# Patient Record
Sex: Female | Born: 1940 | Race: White | Hispanic: No | Marital: Married | Smoking: Never smoker
Health system: Southern US, Community
[De-identification: ages and names within clinical notes are randomized; demographics above are authoritative.]

## PROBLEM LIST (undated history)

## (undated) DIAGNOSIS — E119 Type 2 diabetes mellitus without complications: Secondary | ICD-10-CM

## (undated) DIAGNOSIS — E785 Hyperlipidemia, unspecified: Secondary | ICD-10-CM

## (undated) DIAGNOSIS — M1990 Unspecified osteoarthritis, unspecified site: Secondary | ICD-10-CM

## (undated) DIAGNOSIS — D649 Anemia, unspecified: Secondary | ICD-10-CM

## (undated) DIAGNOSIS — Z9641 Presence of insulin pump (external) (internal): Secondary | ICD-10-CM

## (undated) DIAGNOSIS — Z9889 Other specified postprocedural states: Secondary | ICD-10-CM

## (undated) DIAGNOSIS — R011 Cardiac murmur, unspecified: Secondary | ICD-10-CM

## (undated) DIAGNOSIS — E039 Hypothyroidism, unspecified: Secondary | ICD-10-CM

## (undated) DIAGNOSIS — R112 Nausea with vomiting, unspecified: Secondary | ICD-10-CM

## (undated) DIAGNOSIS — N189 Chronic kidney disease, unspecified: Secondary | ICD-10-CM

## (undated) DIAGNOSIS — K219 Gastro-esophageal reflux disease without esophagitis: Secondary | ICD-10-CM

## (undated) DIAGNOSIS — J45909 Unspecified asthma, uncomplicated: Secondary | ICD-10-CM

## (undated) HISTORY — PX: MENISECTOMY: SHX5181

## (undated) HISTORY — PX: OTHER SURGICAL HISTORY: SHX169

---

## 1970-11-17 HISTORY — PX: ABDOMINAL HYSTERECTOMY: SHX81

## 1993-11-17 HISTORY — PX: CHOLECYSTECTOMY: SHX55

## 1995-11-18 HISTORY — PX: SEPTOPLASTY: SUR1290

## 2001-11-17 HISTORY — PX: BLADDER SURGERY: SHX569

## 2005-01-17 ENCOUNTER — Ambulatory Visit: Payer: Self-pay | Admitting: Internal Medicine

## 2006-03-03 ENCOUNTER — Ambulatory Visit: Payer: Self-pay | Admitting: Internal Medicine

## 2006-11-04 ENCOUNTER — Ambulatory Visit: Payer: Self-pay | Admitting: Specialist

## 2006-11-06 ENCOUNTER — Ambulatory Visit: Payer: Self-pay | Admitting: Specialist

## 2007-03-05 ENCOUNTER — Ambulatory Visit: Payer: Self-pay | Admitting: Internal Medicine

## 2007-03-09 ENCOUNTER — Ambulatory Visit: Payer: Self-pay | Admitting: Internal Medicine

## 2007-04-14 ENCOUNTER — Ambulatory Visit: Payer: Self-pay | Admitting: Specialist

## 2008-05-23 ENCOUNTER — Ambulatory Visit: Payer: Self-pay | Admitting: Internal Medicine

## 2008-11-17 HISTORY — PX: NECK SURGERY: SHX720

## 2008-12-05 ENCOUNTER — Ambulatory Visit: Payer: Self-pay | Admitting: Internal Medicine

## 2009-01-29 ENCOUNTER — Inpatient Hospital Stay (HOSPITAL_COMMUNITY): Admission: RE | Admit: 2009-01-29 | Discharge: 2009-01-30 | Payer: Self-pay | Admitting: Neurosurgery

## 2009-04-27 ENCOUNTER — Ambulatory Visit: Payer: Self-pay | Admitting: Gastroenterology

## 2009-05-31 ENCOUNTER — Ambulatory Visit: Payer: Self-pay | Admitting: Internal Medicine

## 2009-06-19 ENCOUNTER — Ambulatory Visit: Payer: Self-pay | Admitting: Internal Medicine

## 2009-06-27 ENCOUNTER — Ambulatory Visit: Payer: Self-pay | Admitting: Internal Medicine

## 2010-02-04 ENCOUNTER — Ambulatory Visit: Payer: Self-pay

## 2010-03-04 ENCOUNTER — Ambulatory Visit: Payer: Self-pay | Admitting: Orthopedic Surgery

## 2010-03-07 ENCOUNTER — Ambulatory Visit: Payer: Self-pay | Admitting: Orthopedic Surgery

## 2010-08-07 ENCOUNTER — Ambulatory Visit: Payer: Self-pay | Admitting: Internal Medicine

## 2011-01-08 ENCOUNTER — Ambulatory Visit: Payer: Self-pay | Admitting: Urology

## 2011-02-27 LAB — CBC
HCT: 38.8 % (ref 36.0–46.0)
MCHC: 34.9 g/dL (ref 30.0–36.0)
MCV: 85.6 fL (ref 78.0–100.0)
Platelets: 253 10*3/uL (ref 150–400)
RDW: 14.1 % (ref 11.5–15.5)

## 2011-02-27 LAB — BASIC METABOLIC PANEL
BUN: 12 mg/dL (ref 6–23)
CO2: 27 mEq/L (ref 19–32)
Chloride: 106 mEq/L (ref 96–112)
Creatinine, Ser: 1.06 mg/dL (ref 0.4–1.2)
Glucose, Bld: 88 mg/dL (ref 70–99)
Potassium: 4.1 mEq/L (ref 3.5–5.1)

## 2011-04-01 NOTE — Op Note (Signed)
NAMEMANESSA, Elizabeth [name]             ACCOUNT NO.:  [id number]   MEDICAL RECORD NO.:  [id number]          PATIENT TYPE:  INP   LOCATION:  3523                         FACILITY:  [facility]   PHYSICIAN:  Cristi Loron, M.D.DATE OF BIRTH:  [birth year]   DATE OF PROCEDURE:  01/29/2009  DATE OF DISCHARGE:                               OPERATIVE REPORT   BRIEF HISTORY:  The patient in [name] 70 year old white female who suffered  from neck and arm pain.  She has failed medical management, was worked  up with [name] cervical MRI, which demonstrated she had multilevel  degenerative changes, spondylosis, but most prominent at C5-6 and C6-7.  I discussed the various treatment with the patient including surgery.  The patient has weighed the risks, benefits, and alternatives to surgery  and decided to proceed with C5-6 and C6-7 anterior cervical diskectomy,  fusion, and plating.   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   PROCEDURE:  C5-6 and C6-7 extensive anterior cervical  diskectomy/decompression; C5-6 and C6-7 anterior interbody arthrodesis  with local morselized autograft bone and Actifuse bone graft extender;  insertion of C5-6 and C6-7 anterior interbody prosthesis ([place] PEEK  interbody prosthesis); C5-7 anterior cervical plating with Codman slim  lock titanium plate and screws.   SURGEON:  Cristi Loron, MD   ASSISTANT:  Danae Orleans. Venetia Maxon, MD   ANESTHESIA:  General endotracheal.   ESTIMATED BLOOD LOSS:  75 mL.   SPECIMENS:  None.   DRAINS:  None.   COMPLICATIONS:  None.   DESCRIPTION OF PROCEDURE:  The patient was brought to operating room by  anesthesia team.  General endotracheal anesthesia was induced.  The  patient remained in [name] supine position.  [name] roll was placed in her  shoulders, placed her neck in slight extension.  The anterior cervical  region was then prepared with Betadine scrub and Betadine solution.  Sterile drapes were applied.  I then injected the area to be excised  with Marcaine with epinephrine solution.  [name] scalpel to make [name] transverse  incision in the patient's left anterior neck.  I used the Metzenbaum  scissors to divide the platysma muscle and then to dissect medial to  sternocleidomastoid muscle, jugular vein, and carotid artery.  I  carefully dissected down towards the anterior cervical spine identifying  the esophagus and retracting it medially.  I then used the Kittner swabs  to clear soft tissue from the anterior cervical spine and then we  inserted [name] bent spinal needle into the upper exposed intervertebral disk  space.  We obtained intraoperative radiograph to confirm our location.   We then used electrocautery to detach the medial border of the longus  colli muscle bilaterally from the C5-6 and C6-7 intervertebral disk  space.  We began the decompression at C6-7.  We attempted to incised the  C6-7 intervertebral disk with [name] 15 blade scalpel.  It was quite  spondylotic, we had to drill away the anterior spondylosis to get to  the  disk space.  We then incised the intervertebral disk and performed [name]  partial intervertebral diskectomy with pituitary forceps and Carlen  curettes.  We then inserted distraction screws at C6-7, distracted  interspace and then used [name] high-speed drill to decorticate the vertebral  endplates at C6 and C7, drilled away the remainder of C6-7  intervertebral disk to thin out the posterior longitudinal ligament,  drill away posterior spondylosis.  We then incised the ligament with  arachnoid knife and then removed it with Kerrison punch undercutting the  vertebral endplates decompressing the thecal sac and then we performed  foraminotomies about the bilateral C7 nerve root completing the  decompression at this  level.   We then repeated this procedure in analogous fashion at C5-6  decompressing the C5-6 thecal sac and the bilateral C6 nerve roots.  This completed the decompression.   We now turned our attention to arthrodesis.  We used the trial spacers  and determined to use [name] 6-mm medium interbody prosthesis at both levels.  We prefilled the prosthesis with [name] combination of local autograft bone  we obtained during the decompression as well as Actifuse bone graft  extender.  We inserted prosthesis into distracted interspaces and then  removed distraction screws.  There was good snug fit of the prosthesis  at the level.  This completed the fusion and placement of the  prosthesis.   We now turned our attention to anterior spinal instrumentation.  We used  [name] high-speed drill to remove some ventral spondylosis from the vertebral  endplates at C5-6 and C6-7 so that the plate would lay down flat.  We  selected appropriate length Codman slim lock anterior cervical plate and  laid along the anterior aspect of the vertebral bodies from C5-C7.  We  then drilled two 12-mm holes at C5, C6, and C7 and then secured the  plate to the vertebral bodies by placing two 12-mm self-tapping screws  at C5, C6, and C7.  We then obtained intraoperative radiograph which  demonstrated good position of the plates and screws interbody prosthesis  with limited visualization, the construct looked good in vivo.  We  therefore secured these screws and plate by locking each cam.  This  completed the instrumentation.   We then obtained hemostasis using bipolar electrocautery, irrigated the  wound out with bacitracin solution, and then removed the retractor, and  inspected the esophagus for any damage, none apparent.  We then  reapproximated the patient's platysma muscle with interrupted 3-0 Vicryl  suture, subcutaneous tissue with interrupted 3-0 Vicryl suture, and skin  with Steri-Strips and Benzoin.  The wound was then  coated with  bacitracin ointment.  [name] sterile dressing applied, the drapes were  removed, and the patient was subsequently extubated by anesthesia team  and transported to the post anesthesia care unit in stable condition.  All sponge, instrument, and needle counts were correct at the end of the  case.      Cristi Loron, M.D.  Electronically Signed     JDJ/MEDQ  D:  01/29/2009  T:  01/30/2009  Job:  [id number]

## 2011-07-10 ENCOUNTER — Ambulatory Visit: Payer: Self-pay | Admitting: Otolaryngology

## 2011-09-04 ENCOUNTER — Ambulatory Visit: Payer: Self-pay | Admitting: Internal Medicine

## 2012-01-14 ENCOUNTER — Ambulatory Visit: Payer: Self-pay | Admitting: Internal Medicine

## 2012-09-06 ENCOUNTER — Ambulatory Visit: Payer: Self-pay | Admitting: Internal Medicine

## 2013-03-24 LAB — CBC WITH DIFFERENTIAL/PLATELET
Basophil #: 0.1 10*3/uL (ref 0.0–0.1)
HCT: 39.1 % (ref 35.0–47.0)
HGB: 13.3 g/dL (ref 12.0–16.0)
MCH: 30.3 pg (ref 26.0–34.0)
MCHC: 33.9 g/dL (ref 32.0–36.0)
MCV: 89 fL (ref 80–100)
Monocyte #: 0.7 x10 3/mm (ref 0.2–0.9)
Platelet: 218 10*3/uL (ref 150–440)
RDW: 13.2 % (ref 11.5–14.5)
WBC: 6.5 10*3/uL (ref 3.6–11.0)

## 2013-03-24 LAB — COMPREHENSIVE METABOLIC PANEL
Alkaline Phosphatase: 69 U/L (ref 50–136)
Calcium, Total: 9.6 mg/dL (ref 8.5–10.1)
Co2: 30 mmol/L (ref 21–32)
EGFR (African American): >60
EGFR (Non-African Amer.): 58 — ABNORMAL LOW
SGPT (ALT): 23 U/L (ref 12–78)
Sodium: 135 mmol/L — ABNORMAL LOW (ref 136–145)

## 2013-03-24 LAB — URINALYSIS, COMPLETE
Bacteria: NONE SEEN
Ketone: NEGATIVE
Nitrite: NEGATIVE
RBC,UR: <1 /HPF (ref 0–5)
Specific Gravity: 1.008 (ref 1.003–1.030)
WBC UR: 1 /HPF (ref 0–5)

## 2013-03-24 LAB — CK TOTAL AND CKMB (NOT AT ARMC): CK-MB: 3.4 ng/mL (ref 0.5–3.6)

## 2013-03-25 ENCOUNTER — Observation Stay: Payer: Self-pay | Admitting: Internal Medicine

## 2013-09-07 ENCOUNTER — Ambulatory Visit: Payer: Self-pay | Admitting: Internal Medicine

## 2013-09-23 ENCOUNTER — Ambulatory Visit: Payer: Self-pay | Admitting: Orthopedic Surgery

## 2013-11-17 HISTORY — PX: BREAST BIOPSY: SHX20

## 2014-07-09 DIAGNOSIS — Z4681 Encounter for fitting and adjustment of insulin pump: Secondary | ICD-10-CM | POA: Insufficient documentation

## 2014-07-09 DIAGNOSIS — E11649 Type 2 diabetes mellitus with hypoglycemia without coma: Secondary | ICD-10-CM | POA: Insufficient documentation

## 2014-07-09 DIAGNOSIS — E139 Other specified diabetes mellitus without complications: Secondary | ICD-10-CM | POA: Insufficient documentation

## 2014-07-11 DIAGNOSIS — E538 Deficiency of other specified B group vitamins: Secondary | ICD-10-CM | POA: Insufficient documentation

## 2014-10-25 ENCOUNTER — Ambulatory Visit: Payer: Self-pay | Admitting: Internal Medicine

## 2014-11-20 ENCOUNTER — Ambulatory Visit: Payer: Commercial Managed Care - HMO | Admitting: Podiatry

## 2014-12-04 ENCOUNTER — Ambulatory Visit (INDEPENDENT_AMBULATORY_CARE_PROVIDER_SITE_OTHER): Payer: Commercial Managed Care - HMO | Admitting: Podiatry

## 2014-12-04 ENCOUNTER — Encounter: Payer: Self-pay | Admitting: Podiatry

## 2014-12-04 VITALS — BP 120/64 | HR 76 | Resp 16 | Ht 63.0 in | Wt 150.0 lb

## 2014-12-04 DIAGNOSIS — B079 Viral wart, unspecified: Secondary | ICD-10-CM

## 2014-12-04 DIAGNOSIS — B078 Other viral warts: Secondary | ICD-10-CM

## 2014-12-04 NOTE — Progress Notes (Signed)
She presents today with chief complaint of painful lesions 2 plantar aspect of the forefoot left.  Objective: I'll signs are stable she is alert and oriented 3. Pulses are palpable left. 2 small verrucoid lesions plantar aspect of the forefoot left.  Assessment: Verruca plantaris left 2.  Plan: Chemical destruction under occlusion today will follow up with her in 6 weeks.

## 2015-01-15 ENCOUNTER — Ambulatory Visit: Payer: Commercial Managed Care - HMO | Admitting: Podiatry

## 2015-03-09 NOTE — H&P (Signed)
PATIENT NAME:  Elizabeth [name], Elizabeth [name] MR#:  [id number] DATE OF BIRTH:  18-Aug-1941  DATE OF ADMISSION:  03/25/2013  REFERRING PHYSICIAN:  Dr. Cheri Guppy.  PRIMARY CARE PHYSICIAN:  Dr. Lisette Grinder.   CHIEF COMPLAINT:  Left upper and lower extremity weakness.   HISTORY OF PRESENT ILLNESS:  This is a 74 year old female with significant past medical history of diabetes mellitus, hyperlipidemia, hypothyroidism, arthritis, presents with complaints of left side weakness brief episodes, the patient reports she had two episodes of left side weakness upper and lower extremities, reports first episode happened around 5:00 p.m. yesterday when she was blow drying her hair, where she felt a brief episode of left side weakness in the upper and lower extremity, lasted for around 30 seconds which required her to sit down, the patient did not have any facial droop, or dysarthria, or slurred speech, she had episode of dizziness accompanying that episode and reports totally episode resolved after that without any deficits, she did not report this to any family member, the patient did have another one at night around 9:30 p.m. where it lasted as well 30 seconds with similar presentations, as well there was no loss of consciousness, no confusion, no altered status, no facial droop, no slurred speech, and resolved on its own, where she went downstairs spoke to her husband who brought her to ED, in ED patient did not have any focal deficits, has normal radiological exams, had CT brain without contrast which did not show any acute finding, the patient was given 324 mg of by mouth aspirin, without any problem of swallowing or any dysphagia, hospitalist service were requested to admit the patient for further work-up for her TIA symptoms, the patient denies any fever, chills, cough or productive sputum, dysuria, polyuria, coffee-ground emesis.   PAST MEDICAL HISTORY: 1.  Hyperlipidemia.  2.  Hypothyroidism.  3.  Arthritis.  4.  Asthma.   5.  Chronic sinusitis.  6.  Bronchitis.  7.  Type 1 diabetes.   PAST SURGICAL HISTORY: 1.  Hysterectomy.  2.  Cholecystectomy.  3.  Septoplasty.  4.  Cervical laminectomy 2010.   FAMILY HISTORY:  Significant for father having colon cancer.  Mother had cirrhosis of the liver.  As well there is a history of diabetes in the extended family.   SOCIAL HISTORY:  The patient does not smoke, does not drink alcohol.  No history of drug use.  Lives with her husband.   ALLERGIES:  1.  AMOXICILLIN.  2.  CECLOR.  3.  CLINDAMYCIN.  4.  NEOSPORIN.  5.  NIASPAN.  6.  PENICILLIN.  7.  RELAFEN.  8.  STATIN, CAUSING ACLASIA.  9.  SULFASALAZINE.   HOME MEDICATIONS: 1.  Etodolac 400 mg oral 2 times a day.  2.  Insulin pump.  3.  Crestor 5 mg at bedtime.  4.  Nasacort 1 puff each nostril at bedtime.   6.  Omeprazole 40 mg oral daily.  7.  Levothyroxine 0.1 mg oral daily.  9.  Estropipate 1.25 mg 3 to 4 times a week.   REVIEW OF SYSTEMS: CONSTITUTIONAL:  The patient denies any fever or chills, weight loss, weight gain, fatigue.  EYES:  Denies blurry vision, double vision, pain, inflammation, glaucoma.  EARS, NOSE, THROAT:  Denies tinnitus, ear pain, hearing loss, epistaxis or discharge.  RESPIRATORY:  Denies cough, wheezing, hemoptysis, dyspnea, painful respiratory or COPD.  CARDIOVASCULAR:  Denies chest pain, orthopnea, edema, arrhythmia, palpitations or syncope.  GASTROINTESTINAL:  Denies nausea, vomiting, diarrhea,  abdominal pain, hematemesis, melena, rectal bleed, constipation or jaundice.  GENITOURINARY:  Denies dysuria, hematuria, renal colic.  ENDOCRINE:  Denies polyuria, polydipsia, heat or cold intolerance.  HEMATOLOGY:  Denies anemia, easy bruising, bleeding diathesis.  INTEGUMENTARY:  Denies acne, rash or skin lesions.  MUSCULOSKELETAL:  Has history of arthritis.  Denies any gout or cramps or limited activity.  NEUROLOGIC:  Had episode of left upper and lower extremity weakness,  totally resolved, denies any history of CVA, denies any history of seizures or memory loss or headache or ataxia, had mild dizziness with that episode of weakness.  PSYCHIATRIC:  Denies anxiety, bipolar disorder, depression, schizophrenia.   PHYSICAL EXAMINATION: VITAL SIGNS:  Temperature 97.7, pulse 69, respiratory rate 20, blood pressure 160/79, saturating 96% on room air.  GENERAL:  Well-nourished female who looks comfortable in bed in no apparent distress.  HEENT:  Head atraumatic, normocephalic.  Pupils equal, reactive to light.  Pink conjunctivae.  Anicteric sclerae.  Moist oral mucosa.  NECK:  Supple.  No thyromegaly.  No JVD.  No carotid bruits.  CHEST:  Good air entry bilaterally.  No wheezing, rales, rhonchi.  CARDIOVASCULAR:  S1, S2 heard.  No rubs, gallops.  PMI is not displaced.  VASCULAR:  Carotid and femoral pulses +2.  No bruits.  Dorsalis pedis and tibialis +2 as well.  ABDOMEN:  Soft, nontender, nondistended.  Bowel sounds present.  EXTREMITIES:  Mild bilateral pitting edema around the ankle area.  No clubbing.  No cyanosis.  SKIN:  Normal skin turgor.  Warm and dry.  PSYCHIATRIC:  Appropriate affect.  Awake, alert x 3.  Intact judgment and insight.  NEUROLOGIC:  Cranial nerves grossly intact.  Tongue is midline.  Motor, all extremities 5 out of 5 motor strength, sensation is symmetrical and intact.   PERTINENT LABORATORY DATA:  Glucose 115, BUN 18, creatinine 0.97, sodium 135, potassium 4.2, chloride 100, troponin less than 0.02.  White blood cells 6.5, hemoglobin 13.3, hematocrit 39.1, platelets 218.  Urinalysis negative for nitrite, mild leukocyte esterase, but 1 white blood cell and no bacteria seen.  CT head without contrast showing no acute intracranial process.   ASSESSMENT AND PLAN: 1.  Transient ischemic attack, the patient presented with left upper and lower extremity episodes of weakness, very brief, lasted for 30 seconds each, the patient is known to have history of  hyperlipidemia, diabetes mellitus, as well she appears to be having high blood pressure, the patient will be admitted for further work-up, the patient is given 324 mg of aspirin in ED, will be started on baby aspirin daily pending the rest of her work-up, we will obtain MRI of the brain, we will obtain 2-D echo, we will continue her on telemonitor, we will check bilateral carotid Dopplers as well, we will check her lipid panel, she is already on statin which we will continue, blood pressure is mildly elevated, we will allow for blood pressure 160 to 180 to allow better brain perfusion.  2.  Diabetes mellitus, we will check a glycohemoglobin.  The patient is having insulin drip, do anticipate short stay, so we will continue her on insulin drip.  We will continue with her fingersticks before meals and after meals and at bedtime.  3.  Hypothyroidism.  We will continue with Synthroid.  4.  Hyperlipidemia.  We will check lipid panel.  We will continue her on statin.  5.  Hypertension.  The patient does not carry a diagnosis of hypertension in the past, but her blood pressure  seems to be uncontrolled, we will continue to monitor, will not start her on any medication at this point, we will monitor closely, we will allow for blood pressure between 160 to 180 secondary to her diagnosis of transient ischemic attack to allow better perfusion.  6.  Deep vein thrombosis prophylaxis.  SubQ heparin.  7.  Gastrointestinal prophylaxis.  On PPI.  8.  CODE STATUS:  FULL CODE.    Time spent on admission and patient care 55 minutes.    ____________________________ Albertine Patricia, MD dse:ea D: 03/25/2013 01:20:27 ET T: 03/25/2013 02:55:22 ET JOB#: [id number]  cc: Albertine Patricia, MD, <Dictator> Ellora Varnum Graciela Husbands MD ELECTRONICALLY SIGNED 03/30/2013 1:44

## 2015-08-08 ENCOUNTER — Other Ambulatory Visit: Payer: Self-pay | Admitting: Physician Assistant

## 2015-08-08 DIAGNOSIS — M2392 Unspecified internal derangement of left knee: Secondary | ICD-10-CM

## 2015-08-16 ENCOUNTER — Ambulatory Visit
Admission: RE | Admit: 2015-08-16 | Discharge: 2015-08-16 | Disposition: A | Discharge status: Home / Self Care | Payer: Commercial Managed Care - HMO | Source: Ambulatory Visit | Attending: Physician Assistant | Admitting: Physician Assistant

## 2015-08-16 DIAGNOSIS — M7050 Other bursitis of knee, unspecified knee: Secondary | ICD-10-CM | POA: Diagnosis not present

## 2015-08-16 DIAGNOSIS — M2392 Unspecified internal derangement of left knee: Secondary | ICD-10-CM

## 2015-08-16 DIAGNOSIS — M25462 Effusion, left knee: Secondary | ICD-10-CM | POA: Diagnosis not present

## 2015-08-16 DIAGNOSIS — X58XXXA Exposure to other specified factors, initial encounter: Secondary | ICD-10-CM | POA: Diagnosis not present

## 2015-08-16 DIAGNOSIS — S83242A Other tear of medial meniscus, current injury, left knee, initial encounter: Secondary | ICD-10-CM | POA: Diagnosis not present

## 2015-08-16 DIAGNOSIS — M25562 Pain in left knee: Secondary | ICD-10-CM | POA: Diagnosis present

## 2015-08-16 DIAGNOSIS — M94262 Chondromalacia, left knee: Secondary | ICD-10-CM | POA: Insufficient documentation

## 2015-09-04 ENCOUNTER — Other Ambulatory Visit: Payer: Commercial Managed Care - HMO

## 2015-09-04 ENCOUNTER — Encounter: Payer: Self-pay | Admitting: *Deleted

## 2015-09-04 NOTE — Patient Instructions (Signed)
  Your procedure is scheduled on: 09-17-15 (MONDAY) Report to [facility] To find out your arrival time please call [phone] between 1PM - 3PM on 09-14-15 (FRIDAY)  Remember: Instructions that are not followed completely may result in serious medical risk, up to and including death, or upon the discretion of your surgeon and anesthesiologist your surgery may need to be rescheduled.    _X___ 1. Do not eat food or drink liquids after midnight. No gum chewing or hard candies.     _X___ 2. No Alcohol for 24 hours before or after surgery.   ____ 3. Bring all medications with you on the day of surgery if instructed.    __X__ 4. Notify your doctor if there is any change in your medical condition     (cold, fever, infections).     Do not wear jewelry, make-up, hairpins, clips or nail polish.  Do not wear lotions, powders, or perfumes. You may wear deodorant.  Do not shave 48 hours prior to surgery. Men may shave face and neck.  Do not bring valuables to the hospital.    [facility] is not responsible for any belongings or valuables.               Contacts, dentures or bridgework may not be worn into surgery.  Leave your suitcase in the car. After surgery it may be brought to your room.  For patients admitted to the hospital, discharge time is determined by your treatment team.   Patients discharged the day of surgery will not be allowed to drive home.   Please read over the following fact sheets that you were given:      _X___ Take these medicines the morning of surgery with A SIP OF WATER:    1. LEVOTHYROXINE  2.   3.   4.  5.  6.  ____ Fleet Enema (as directed)   _X___ Use CHG Soap as directed  ____ Use inhalers on the day of surgery  ____ Stop metformin 2 days prior to surgery    ____ Take 1/2 of usual insulin dose the night before surgery and none on the morning of surgery-INSULIN PUMP AS USUAL  _X___ Stop Coumadin/Plavix/aspirin-STOP  ASPIRIN 7 DAYS PRIOR TO SURGERY  _X___ Stop Anti-inflammatories-STOP ALEVE 7 DAYS PRIOR TO SURGERY-NO NSAIDS OR ASPIRIN PRODUCTS-TYLENOL OK   _X___ Stop supplements until after surgery-STOP BIOTIN 7 DAYS PRIOR TO SURGERY   ____ Bring C-Pap to the hospital.

## 2015-09-05 ENCOUNTER — Encounter
Admission: RE | Admit: 2015-09-05 | Discharge: 2015-09-05 | Disposition: A | Discharge status: Home / Self Care | Payer: Commercial Managed Care - HMO | Source: Ambulatory Visit | Attending: Anesthesiology | Admitting: Anesthesiology

## 2015-09-05 DIAGNOSIS — Z01818 Encounter for other preprocedural examination: Secondary | ICD-10-CM | POA: Insufficient documentation

## 2015-09-05 LAB — BASIC METABOLIC PANEL
ANION GAP: 8 (ref 5–15)
BUN: 14 mg/dL (ref 6–20)
CALCIUM: 9.4 mg/dL (ref 8.9–10.3)
CO2: 28 mmol/L (ref 22–32)
Chloride: 96 mmol/L — ABNORMAL LOW (ref 101–111)
Creatinine, Ser: 0.88 mg/dL (ref 0.44–1.00)
GFR calc non Af Amer: >60 mL/min (ref >60)
Glucose, Bld: 264 mg/dL — ABNORMAL HIGH (ref 65–99)
Potassium: 4.6 mmol/L (ref 3.5–5.1)
Sodium: 132 mmol/L — ABNORMAL LOW (ref 135–145)

## 2015-09-14 ENCOUNTER — Encounter: Payer: Self-pay | Admitting: *Deleted

## 2015-09-17 ENCOUNTER — Encounter
Admission: RE | Disposition: A | Discharge status: Home / Self Care | Payer: Self-pay | Source: Ambulatory Visit | Attending: Orthopedic Surgery

## 2015-09-17 ENCOUNTER — Ambulatory Visit: Payer: Commercial Managed Care - HMO | Admitting: Anesthesiology

## 2015-09-17 ENCOUNTER — Encounter: Payer: Self-pay | Admitting: *Deleted

## 2015-09-17 ENCOUNTER — Ambulatory Visit
Admission: RE | Admit: 2015-09-17 | Discharge: 2015-09-17 | Disposition: A | Discharge status: Home / Self Care | Payer: Commercial Managed Care - HMO | Source: Ambulatory Visit | Attending: Orthopedic Surgery | Admitting: Orthopedic Surgery

## 2015-09-17 DIAGNOSIS — Z7982 Long term (current) use of aspirin: Secondary | ICD-10-CM | POA: Diagnosis not present

## 2015-09-17 DIAGNOSIS — Z803 Family history of malignant neoplasm of breast: Secondary | ICD-10-CM | POA: Insufficient documentation

## 2015-09-17 DIAGNOSIS — M23222 Derangement of posterior horn of medial meniscus due to old tear or injury, left knee: Secondary | ICD-10-CM | POA: Diagnosis not present

## 2015-09-17 DIAGNOSIS — M25562 Pain in left knee: Secondary | ICD-10-CM | POA: Diagnosis present

## 2015-09-17 DIAGNOSIS — J40 Bronchitis, not specified as acute or chronic: Secondary | ICD-10-CM | POA: Insufficient documentation

## 2015-09-17 DIAGNOSIS — Z90710 Acquired absence of both cervix and uterus: Secondary | ICD-10-CM | POA: Insufficient documentation

## 2015-09-17 DIAGNOSIS — Z8379 Family history of other diseases of the digestive system: Secondary | ICD-10-CM | POA: Diagnosis not present

## 2015-09-17 DIAGNOSIS — Z880 Allergy status to penicillin: Secondary | ICD-10-CM | POA: Diagnosis not present

## 2015-09-17 DIAGNOSIS — E109 Type 1 diabetes mellitus without complications: Secondary | ICD-10-CM | POA: Diagnosis not present

## 2015-09-17 DIAGNOSIS — Z800 Family history of malignant neoplasm of digestive organs: Secondary | ICD-10-CM | POA: Diagnosis not present

## 2015-09-17 DIAGNOSIS — E785 Hyperlipidemia, unspecified: Secondary | ICD-10-CM | POA: Diagnosis not present

## 2015-09-17 DIAGNOSIS — E039 Hypothyroidism, unspecified: Secondary | ICD-10-CM | POA: Diagnosis not present

## 2015-09-17 DIAGNOSIS — M1990 Unspecified osteoarthritis, unspecified site: Secondary | ICD-10-CM | POA: Insufficient documentation

## 2015-09-17 DIAGNOSIS — Z794 Long term (current) use of insulin: Secondary | ICD-10-CM | POA: Diagnosis not present

## 2015-09-17 DIAGNOSIS — Z881 Allergy status to other antibiotic agents status: Secondary | ICD-10-CM | POA: Diagnosis not present

## 2015-09-17 DIAGNOSIS — Z9049 Acquired absence of other specified parts of digestive tract: Secondary | ICD-10-CM | POA: Insufficient documentation

## 2015-09-17 DIAGNOSIS — Z888 Allergy status to other drugs, medicaments and biological substances status: Secondary | ICD-10-CM | POA: Insufficient documentation

## 2015-09-17 DIAGNOSIS — Z79899 Other long term (current) drug therapy: Secondary | ICD-10-CM | POA: Diagnosis not present

## 2015-09-17 DIAGNOSIS — M26609 Unspecified temporomandibular joint disorder, unspecified side: Secondary | ICD-10-CM | POA: Diagnosis not present

## 2015-09-17 DIAGNOSIS — M94262 Chondromalacia, left knee: Secondary | ICD-10-CM | POA: Insufficient documentation

## 2015-09-17 DIAGNOSIS — J329 Chronic sinusitis, unspecified: Secondary | ICD-10-CM | POA: Diagnosis not present

## 2015-09-17 DIAGNOSIS — Z820 Family history of epilepsy and other diseases of the nervous system: Secondary | ICD-10-CM | POA: Diagnosis not present

## 2015-09-17 HISTORY — DX: Type 2 diabetes mellitus without complications: E11.9

## 2015-09-17 HISTORY — DX: Presence of insulin pump (external) (internal): Z96.41

## 2015-09-17 HISTORY — PX: KNEE ARTHROSCOPY: SHX127

## 2015-09-17 HISTORY — DX: Unspecified osteoarthritis, unspecified site: M19.90

## 2015-09-17 HISTORY — DX: Cardiac murmur, unspecified: R01.1

## 2015-09-17 HISTORY — DX: Hypothyroidism, unspecified: E03.9

## 2015-09-17 HISTORY — DX: Gastro-esophageal reflux disease without esophagitis: K21.9

## 2015-09-17 HISTORY — DX: Other specified postprocedural states: Z98.890

## 2015-09-17 HISTORY — DX: Nausea with vomiting, unspecified: R11.2

## 2015-09-17 LAB — GLUCOSE, CAPILLARY
GLUCOSE-CAPILLARY: 163 mg/dL — AB (ref 65–99)
Glucose-Capillary: 107 mg/dL — ABNORMAL HIGH (ref 65–99)

## 2015-09-17 SURGERY — ARTHROSCOPY, KNEE
Anesthesia: General | Site: Knee | Laterality: Left | Wound class: Clean

## 2015-09-17 MED ORDER — HYDROCODONE-ACETAMINOPHEN 5-325 MG PO TABS: 1.0000 | ORAL_TABLET | ORAL | Status: DC | PRN

## 2015-09-17 MED ORDER — GLYCOPYRROLATE 0.2 MG/ML IJ SOLN
INTRAMUSCULAR | Status: DC | PRN
  Administered 2015-09-17: 0.2 mg via INTRAVENOUS

## 2015-09-17 MED ORDER — ONDANSETRON HCL 4 MG/2ML IJ SOLN: 4.0000 mg | Freq: Four times a day (QID) | INTRAMUSCULAR | Status: DC | PRN

## 2015-09-17 MED ORDER — MIDAZOLAM HCL 2 MG/2ML IJ SOLN
INTRAMUSCULAR | Status: DC | PRN
  Administered 2015-09-17: 2 mg via INTRAVENOUS

## 2015-09-17 MED ORDER — FENTANYL CITRATE (PF) 100 MCG/2ML IJ SOLN
25.0000 ug | INTRAMUSCULAR | Status: DC | PRN
  Administered 2015-09-17 (×2): 25 ug via INTRAVENOUS

## 2015-09-17 MED ORDER — ONDANSETRON HCL 4 MG/2ML IJ SOLN
INTRAMUSCULAR | Status: DC | PRN
  Administered 2015-09-17: 4 mg via INTRAVENOUS

## 2015-09-17 MED ORDER — METOCLOPRAMIDE HCL 5 MG/ML IJ SOLN: 5.0000 mg | Freq: Three times a day (TID) | INTRAMUSCULAR | Status: DC | PRN

## 2015-09-17 MED ORDER — OXYCODONE HCL 5 MG PO TABS: 5.0000 mg | ORAL_TABLET | Freq: Once | ORAL | Status: DC | PRN

## 2015-09-17 MED ORDER — SODIUM CHLORIDE 0.9 % IV SOLN: INTRAVENOUS | Status: DC

## 2015-09-17 MED ORDER — CHLORHEXIDINE GLUCONATE 4 % EX LIQD
Freq: Once | CUTANEOUS | Status: AC
Start: 2015-09-17 — End: 2015-09-17
  Administered 2015-09-17: 15:00:00 via TOPICAL

## 2015-09-17 MED ORDER — ONDANSETRON HCL 4 MG PO TABS: 4.0000 mg | ORAL_TABLET | Freq: Four times a day (QID) | ORAL | Status: DC | PRN

## 2015-09-17 MED ORDER — FENTANYL CITRATE (PF) 100 MCG/2ML IJ SOLN
INTRAMUSCULAR | Status: DC | PRN
Start: 2015-09-17 — End: 2015-09-17
  Administered 2015-09-17 (×2): 50 ug via INTRAVENOUS

## 2015-09-17 MED ORDER — EPHEDRINE SULFATE 50 MG/ML IJ SOLN
INTRAMUSCULAR | Status: DC | PRN
  Administered 2015-09-17: 10 mg via INTRAVENOUS
  Administered 2015-09-17 (×2): 5 mg via INTRAVENOUS

## 2015-09-17 MED ORDER — BUPIVACAINE-EPINEPHRINE (PF) 0.25% -1:200000 IJ SOLN
INTRAMUSCULAR | Status: AC
  Filled 2015-09-17: qty 30

## 2015-09-17 MED ORDER — PROPOFOL 10 MG/ML IV BOLUS
INTRAVENOUS | Status: DC | PRN
  Administered 2015-09-17: 130 mg via INTRAVENOUS

## 2015-09-17 MED ORDER — SODIUM CHLORIDE 0.9 % IV SOLN
INTRAVENOUS | Status: DC
  Administered 2015-09-17 (×2): via INTRAVENOUS

## 2015-09-17 MED ORDER — OXYCODONE HCL 5 MG/5ML PO SOLN: 5.0000 mg | Freq: Once | ORAL | Status: DC | PRN

## 2015-09-17 MED ORDER — ACETAMINOPHEN 10 MG/ML IV SOLN
INTRAVENOUS | Status: DC | PRN
  Administered 2015-09-17: 1000 mg via INTRAVENOUS

## 2015-09-17 MED ORDER — MORPHINE SULFATE (PF) 4 MG/ML IV SOLN
INTRAVENOUS | Status: AC
  Filled 2015-09-17: qty 1

## 2015-09-17 MED ORDER — BUPIVACAINE-EPINEPHRINE 0.25% -1:200000 IJ SOLN
INTRAMUSCULAR | Status: DC | PRN
  Administered 2015-09-17: 30 mL

## 2015-09-17 MED ORDER — ACETAMINOPHEN 10 MG/ML IV SOLN
INTRAVENOUS | Status: AC
  Filled 2015-09-17: qty 100

## 2015-09-17 MED ORDER — METOCLOPRAMIDE HCL 10 MG PO TABS: 5.0000 mg | ORAL_TABLET | Freq: Three times a day (TID) | ORAL | Status: DC | PRN

## 2015-09-17 MED ORDER — LIDOCAINE HCL 2 % EX GEL
CUTANEOUS | Status: DC | PRN
  Administered 2015-09-17: 1 via TOPICAL

## 2015-09-17 MED ORDER — FAMOTIDINE 20 MG PO TABS
20.0000 mg | ORAL_TABLET | Freq: Once | ORAL | Status: AC
  Administered 2015-09-17: 20 mg via ORAL

## 2015-09-17 MED ORDER — MORPHINE SULFATE (PF) 4 MG/ML IV SOLN
INTRAVENOUS | Status: DC | PRN
  Administered 2015-09-17: 4 mg via INTRAVENOUS

## 2015-09-17 MED ORDER — DEXAMETHASONE SODIUM PHOSPHATE 4 MG/ML IJ SOLN
INTRAMUSCULAR | Status: DC | PRN
  Administered 2015-09-17: 10 mg via INTRAVENOUS

## 2015-09-17 MED ORDER — LIDOCAINE HCL (CARDIAC) 20 MG/ML IV SOLN
INTRAVENOUS | Status: DC | PRN
  Administered 2015-09-17: 80 mg via INTRAVENOUS

## 2015-09-17 MED ORDER — FENTANYL CITRATE (PF) 100 MCG/2ML IJ SOLN
INTRAMUSCULAR | Status: AC
  Filled 2015-09-17: qty 2

## 2015-09-17 MED ORDER — PHENYLEPHRINE HCL 10 MG/ML IJ SOLN
INTRAMUSCULAR | Status: DC | PRN
  Administered 2015-09-17 (×5): 100 ug via INTRAVENOUS

## 2015-09-17 MED ORDER — FAMOTIDINE 20 MG PO TABS
ORAL_TABLET | ORAL | Status: AC
  Administered 2015-09-17: 20 mg via ORAL
  Filled 2015-09-17: qty 1

## 2015-09-17 SURGICAL SUPPLY — 23 items
BLADE SHAVER 4.5 DBL SERAT CV (CUTTER) ×3 IMPLANT
BNDG ESMARK 6X12 TAN STRL LF (GAUZE/BANDAGES/DRESSINGS) ×3 IMPLANT
DRSG DERMACEA 8X12 NADH (GAUZE/BANDAGES/DRESSINGS) ×3 IMPLANT
DURAPREP 26ML APPLICATOR (WOUND CARE) ×6 IMPLANT
GAUZE SPONGE 4X4 12PLY STRL (GAUZE/BANDAGES/DRESSINGS) ×3 IMPLANT
GLOVE BIOGEL M STRL SZ7.5 (GLOVE) ×3 IMPLANT
GLOVE INDICATOR 8.0 STRL GRN (GLOVE) ×3 IMPLANT
GOWN STRL REUS W/ TWL LRG LVL3 (GOWN DISPOSABLE) ×1 IMPLANT
GOWN STRL REUS W/ TWL LRG LVL4 (GOWN DISPOSABLE) ×1 IMPLANT
GOWN STRL REUS W/TWL LRG LVL3 (GOWN DISPOSABLE) ×2
GOWN STRL REUS W/TWL LRG LVL4 (GOWN DISPOSABLE) ×2
IV LACTATED RINGER IRRG 3000ML (IV SOLUTION) ×12
IV LR IRRIG 3000ML ARTHROMATIC (IV SOLUTION) ×6 IMPLANT
MANIFOLD NEPTUNE II (INSTRUMENTS) ×3 IMPLANT
PACK ARTHROSCOPY KNEE (MISCELLANEOUS) ×3 IMPLANT
SET TUBE SUCT SHAVER OUTFL 24K (TUBING) ×3 IMPLANT
SET TUBE TIP INTRA-ARTICULAR (MISCELLANEOUS) ×3 IMPLANT
STRAP SAFETY BODY (MISCELLANEOUS) ×3 IMPLANT
SUT ETHILON 3-0 FS-10 30 BLK (SUTURE) ×3
SUTURE EHLN 3-0 FS-10 30 BLK (SUTURE) ×1 IMPLANT
TUBING ARTHRO INFLOW-ONLY STRL (TUBING) ×3 IMPLANT
WAND HAND CNTRL MULTIVAC 50 (MISCELLANEOUS) ×3 IMPLANT
WRAP KNEE W/COLD PACKS 25.5X14 (SOFTGOODS) ×3 IMPLANT

## 2015-09-17 NOTE — Anesthesia Postprocedure Evaluation (Signed)
  Anesthesia Post-op Note  Patient: Elizabeth [name]  Procedure(s) Performed: Procedure(s): ARTHROSCOPY KNEE,partial medial menisectomy, chondroplasty medial patella femoral. (Left)  Anesthesia type:General LMA  Patient location: PACU  Post pain: Pain level controlled  Post assessment: Post-op Vital signs reviewed, Patient's Cardiovascular Status Stable, Respiratory Function Stable, Patent Airway and No signs of Nausea or vomiting  Post vital signs: Reviewed and stable  Last Vitals:  Filed Vitals:   09/17/15 1733  BP: 109/56  Pulse: 81  Temp: 36.1 C  Resp: 15    Level of consciousness: awake, alert  and patient cooperative  Complications: No apparent anesthesia complications

## 2015-09-17 NOTE — Transfer of Care (Signed)
Immediate Anesthesia Transfer of Care Note  Patient: Elizabeth [name]  Procedure(s) Performed: Procedure(s): ARTHROSCOPY KNEE,partial medial menisectomy, chondroplasty medial patella femoral. (Left)  Patient Location: PACU  Anesthesia Type:General  Level of Consciousness: awake, alert , oriented and patient cooperative  Airway & Oxygen Therapy: Patient Spontanous Breathing and Patient connected to face mask oxygen  Post-op Assessment: Report given to RN, Post -op Vital signs reviewed and stable and Patient moving all extremities X 4  Post vital signs: Reviewed and stable  Last Vitals:  Filed Vitals:   09/17/15 1432  BP: 144/73  Pulse: 82  Temp: 36.8 C  Resp: 16    Complications: No apparent anesthesia complications

## 2015-09-17 NOTE — Anesthesia Procedure Notes (Signed)
Procedure Name: LMA Insertion Date/Time: 09/17/2015 3:58 PM Performed by: Doreen Salvage Pre-anesthesia Checklist: Patient identified, Patient being monitored, Timeout performed, Emergency Drugs available and Suction available Patient Re-evaluated:Patient Re-evaluated prior to inductionOxygen Delivery Method: Circle system utilized Preoxygenation: Pre-oxygenation with 100% oxygen Intubation Type: IV induction Ventilation: Mask ventilation without difficulty LMA: LMA inserted LMA Size: 3.0 Tube type: Oral Number of attempts: 1 Placement Confirmation: positive ETCO2 and breath sounds checked- equal and bilateral Tube secured with: Tape Dental Injury: Teeth and Oropharynx as per pre-operative assessment

## 2015-09-17 NOTE — Discharge Instructions (Signed)
°  Instructions after Knee Arthroscopy  ° °- James P. Hooten, Jr., M.D.    ° Dept. of Orthopaedics & Sports Medicine ° [facility] ° [address] ° [place], [place]  [address] ° ° Phone: [phone]   Fax: [phone] ° ° °DIET: °• Drink plenty of non-alcoholic fluids & begin a light diet. °• Resume your normal diet the day after surgery. ° °ACTIVITY:  °• You may use crutches or a walker with weight-bearing as tolerated, unless instructed otherwise. °• You may wean yourself off of the walker or crutches as tolerated.  °• Begin doing gentle exercises. Exercising will reduce the pain and swelling, increase motion, and prevent muscle weakness.   °• Avoid strenuous activities or athletics for a minimum of 4-6 weeks after arthroscopic surgery. °• Do not drive or operate any equipment until instructed. ° °WOUND CARE:  °• Place one to two pillows under the knee the first day or two when sitting or lying.  °• Continue to use the ice packs periodically to reduce pain and swelling. °• The small incisions in your knee are closed with nylon stitches. The stitches will be removed in the office. °• The bulky dressing may be removed on the second day after surgery. DO NOT TOUCH THE STITCHES. Put a Band-Aid over each stitch. Do NOT use any ointments or creams on the incisions.  °• You may bathe or shower after the stitches are removed at the first office visit following surgery. ° °MEDICATIONS: °• You may resume your regular medications. °• Please take the pain medication as prescribed. °• Do not take pain medication on an empty stomach. °• Do not drive or drink alcoholic beverages when taking pain medications. ° °CALL THE OFFICE FOR: °• Temperature above 101 degrees °• Excessive bleeding or drainage on the dressing. °• Excessive swelling, coldness, or paleness of the toes. °• Persistent nausea and vomiting. ° °FOLLOW-UP:  °• You should have an appointment to return to the office in 7-10 days after surgery.   ° ° °AMBULATORY SURGERY  °DISCHARGE INSTRUCTIONS ° ° °1) The drugs that you were given will stay in your system until tomorrow so for the next 24 hours you should not: ° °A) Drive an automobile °B) Make any legal decisions °C) Drink any alcoholic beverage ° ° °2) You may resume regular meals tomorrow.  Today it is better to start with liquids and gradually work up to solid foods. ° °You may eat anything you prefer, but it is better to start with liquids, then soup and crackers, and gradually work up to solid foods. ° ° °3) Please notify your doctor immediately if you have any unusual bleeding, trouble breathing, redness and pain at the surgery site, drainage, fever, or pain not relieved by medication. ° ° ° °4) Additional Instructions: ° ° ° ° ° ° ° °Please contact your physician with any problems or Same Day Surgery at [phone], Monday through Friday 6 am to 4 pm, or [facility] [facility] number at [phone]. ° °

## 2015-09-17 NOTE — H&P (Signed)
The patient has been re-examined, and the chart reviewed, and there have been no interval changes to the documented history and physical.    The risks, benefits, and alternatives have been discussed at length. The patient expressed understanding of the risks benefits and agreed with plans for surgical intervention.  James P. Hooten, Jr. M.D.    

## 2015-09-17 NOTE — Op Note (Signed)
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  SURGEON:  Marciano Sequin., M.D.   ASSISTANT: none  ANESTHESIA: general  ESTIMATED BLOOD LOSS: Minimal  FLUIDS REPLACED: 1100 mL of crystalloid  TOURNIQUET TIME: Not used   DRAINS: none  IMPLANTS UTILIZED: None  INDICATIONS FOR SURGERY: Elizabeth [name] is a 74 y.o. year old female who has been seen for complaints of left knee pain. MRI demonstrated findings consistent with meniscal pathology. After discussion of the risks and benefits of surgical intervention, the patient expressed understanding of the risks benefits and agree with plans for left knee arthroscopy.   PROCEDURE IN DETAIL: The patient was brought into the operating room and, after adequate general anesthesia was achieved, a tourniquet was applied to the left thigh and the leg was placed in the leg holder. All bony prominences were well padded. The patient's left knee was cleaned and prepped with alcohol and Duraprep and draped in the usual sterile fashion. A "timeout" was performed as per usual protocol. The anticipated portal sites were injected with 0.25% Marcaine with epinephrine. An anterolateral incision was made and a cannula was inserted. A large effusion was evacuated and the knee was distended with fluid using the pump. The scope was advanced down the medial gutter into the medial compartment. Under visualization with the scope, an anteromedial portal was created and a hooked probe was inserted. The medial meniscus  was visualized and probed. There was a complex tear of the posterior horn of the medial meniscus and a smaller degenerative tear more medially. Both tears were debrided using combination of meniscal punches and a 4.5 mm incisor shaver. Final contouring was performed using the 50 ArthroCare wand. The articular cartilage was visualized. There were grade 3 changes of chondromalacia involving the medial femoral condyle. These areas were debrided and contoured using the 50 ArthroCare wand.  The scope was then advanced into the intercondylar notch. The anterior cruciate ligament was visualized and probed and felt to be intact. The scope was removed from the lateral portal and reinserted via the anteromedial portal to better visualize the lateral compartment. The lateral meniscus was visualized and probed. The lateral meniscus was stable without evidence of a tear. The articular cartilage of the lateral compartment was visualized and noted to be in good condition. Finally, the scope was advanced so as to visualize the patellofemoral articulation. Good patellar tracking was appreciated. There were grade 3 changes of chondromalacia involving the facets of the patella as well as along the trochlear groove. These areas were debrided and contoured using the 50 ArthroCare wand.  The knee was irrigated with copius amounts of fluid and suctioned dry. The anterolateral portal was re-approximated with #3-0 nylon. A combination of 0.25% Marcaine with epinephrine and 4 mg of Morphine were injected via the scope. The scope was removed and the anteromedial portal was re-approximated with #3-0 nylon. A sterile dressing was applied followed by application of an ice wrap.  The patient tolerated the procedure well and was transported to the PACU in stable condition.  Elizabeth [name]., M.D.

## 2015-09-17 NOTE — Brief Op Note (Signed)
09/17/2015  5:35 PM  PATIENT:  Elizabeth [name]  74 y.o. female  PRE-OPERATIVE DIAGNOSIS:  TORN MENISCUS- left knee  POST-OPERATIVE DIAGNOSIS:  left posterior horn medial meniscus tear, grade 3 chondromalacia (medial femoral condyle, patellofemoral compartment)  PROCEDURE:  Procedure(s): ARTHROSCOPY KNEE,partial medial menisectomy, chondroplasty medial patella femoral. (Left)  SURGEON:  Surgeon(s) and Role:    * Dereck Leep, MD - Primary  ASSISTANTS: none   ANESTHESIA:   general  EBL:  Total I/O In: 1100 [I.V.:1100] Out: 15 [Blood:15]  BLOOD ADMINISTERED:none  DRAINS: none   LOCAL MEDICATIONS USED:  MARCAINE     SPECIMEN:  No Specimen  DISPOSITION OF SPECIMEN:  N/A  COUNTS:  YES  TOURNIQUET:  not used  DICTATION: .Sales executive  PLAN OF CARE: Discharge to home after PACU  PATIENT DISPOSITION:  PACU - hemodynamically stable.   Delay start of Pharmacological VTE agent (>24hrs) due to surgical blood loss or risk of bleeding: not applicable

## 2015-09-17 NOTE — Anesthesia Preprocedure Evaluation (Signed)
Anesthesia Evaluation  Patient identified by MRN, date of birth, ID band Patient awake    Reviewed: Allergy & Precautions, H&P , NPO status , Patient's Chart, lab work & pertinent test results  History of Anesthesia Complications (+) PONV and history of anesthetic complications  Airway Mallampati: II  TM Distance: >3 FB Neck ROM: limited    Dental no notable dental hx. (+) Teeth Intact   Pulmonary neg pulmonary ROS, neg shortness of breath,    Pulmonary exam normal breath sounds clear to auscultation       Cardiovascular Exercise Tolerance: Good (-) angina(-) Past MI and (-) DOE negative cardio ROS Normal cardiovascular exam Rhythm:regular Rate:Normal     Neuro/Psych negative neurological ROS  negative psych ROS   GI/Hepatic negative GI ROS, Neg liver ROS, GERD  Controlled,  Endo/Other  diabetesHypothyroidism   Renal/GU negative Renal ROS  negative genitourinary   Musculoskeletal  (+) Arthritis ,   Abdominal   Peds  Hematology negative hematology ROS (+)   Anesthesia Other Findings Past Medical History:   Hypothyroidism                                               Diabetes mellitus without complication (HCC)                 Heart murmur                                                 GERD (gastroesophageal reflux disease)                         Comment:H/O   Arthritis                                                    PONV (postoperative nausea and vomiting)                     Hx of cervical spine surgery                                 Insulin pump in place                                       Past Surgical History:   ABDOMINAL HYSTERECTOMY                                        CHOLECYSTECTOMY                                               SEPTOPLASTY  MENISECTOMY                                                   BLADDER SURGERY                                               BMI    Body Mass Index   26.93 kg/m 2      Reproductive/Obstetrics negative OB ROS                             Anesthesia Physical Anesthesia Plan  ASA: III  Anesthesia Plan: General LMA   Post-op Pain Management:    Induction:   Airway Management Planned:   Additional Equipment:   Intra-op Plan:   Post-operative Plan:   Informed Consent: I have reviewed the patients History and Physical, chart, labs and discussed the procedure including the risks, benefits and alternatives for the proposed anesthesia with the patient or authorized representative who has indicated his/her understanding and acceptance.   Dental Advisory Given  Plan Discussed with: Anesthesiologist, CRNA and Surgeon  Anesthesia Plan Comments:         Anesthesia Quick Evaluation

## 2015-09-18 ENCOUNTER — Encounter: Payer: Self-pay | Admitting: Orthopedic Surgery

## 2015-09-28 ENCOUNTER — Other Ambulatory Visit: Payer: Self-pay | Admitting: Internal Medicine

## 2015-09-28 DIAGNOSIS — Z1231 Encounter for screening mammogram for malignant neoplasm of breast: Secondary | ICD-10-CM

## 2015-10-31 ENCOUNTER — Ambulatory Visit: Payer: Commercial Managed Care - HMO | Attending: Internal Medicine

## 2015-11-08 ENCOUNTER — Ambulatory Visit
Admission: RE | Admit: 2015-11-08 | Discharge: 2015-11-08 | Disposition: A | Discharge status: Home / Self Care | Payer: Commercial Managed Care - HMO | Source: Ambulatory Visit | Attending: Internal Medicine | Admitting: Internal Medicine

## 2015-11-08 ENCOUNTER — Other Ambulatory Visit: Payer: Self-pay | Admitting: Internal Medicine

## 2015-11-08 DIAGNOSIS — Z1231 Encounter for screening mammogram for malignant neoplasm of breast: Secondary | ICD-10-CM | POA: Diagnosis present

## 2015-12-11 DIAGNOSIS — M2242 Chondromalacia patellae, left knee: Secondary | ICD-10-CM | POA: Diagnosis not present

## 2015-12-11 DIAGNOSIS — E10649 Type 1 diabetes mellitus with hypoglycemia without coma: Secondary | ICD-10-CM | POA: Diagnosis not present

## 2015-12-11 DIAGNOSIS — M94262 Chondromalacia, left knee: Secondary | ICD-10-CM | POA: Diagnosis not present

## 2015-12-11 DIAGNOSIS — E039 Hypothyroidism, unspecified: Secondary | ICD-10-CM | POA: Diagnosis not present

## 2015-12-11 DIAGNOSIS — E538 Deficiency of other specified B group vitamins: Secondary | ICD-10-CM | POA: Diagnosis not present

## 2015-12-17 DIAGNOSIS — H6502 Acute serous otitis media, left ear: Secondary | ICD-10-CM | POA: Diagnosis not present

## 2015-12-20 DIAGNOSIS — Z4681 Encounter for fitting and adjustment of insulin pump: Secondary | ICD-10-CM | POA: Diagnosis not present

## 2015-12-20 DIAGNOSIS — E10649 Type 1 diabetes mellitus with hypoglycemia without coma: Secondary | ICD-10-CM | POA: Diagnosis not present

## 2015-12-20 DIAGNOSIS — E785 Hyperlipidemia, unspecified: Secondary | ICD-10-CM | POA: Diagnosis not present

## 2015-12-20 DIAGNOSIS — E538 Deficiency of other specified B group vitamins: Secondary | ICD-10-CM | POA: Diagnosis not present

## 2015-12-20 DIAGNOSIS — E039 Hypothyroidism, unspecified: Secondary | ICD-10-CM | POA: Diagnosis not present

## 2015-12-28 DIAGNOSIS — K5730 Diverticulosis of large intestine without perforation or abscess without bleeding: Secondary | ICD-10-CM | POA: Diagnosis not present

## 2015-12-28 DIAGNOSIS — Z8371 Family history of colonic polyps: Secondary | ICD-10-CM | POA: Diagnosis not present

## 2016-01-11 DIAGNOSIS — E10649 Type 1 diabetes mellitus with hypoglycemia without coma: Secondary | ICD-10-CM | POA: Diagnosis not present

## 2016-01-14 DIAGNOSIS — E10649 Type 1 diabetes mellitus with hypoglycemia without coma: Secondary | ICD-10-CM | POA: Diagnosis not present

## 2016-01-15 DIAGNOSIS — E10649 Type 1 diabetes mellitus with hypoglycemia without coma: Secondary | ICD-10-CM | POA: Diagnosis not present

## 2016-01-22 DIAGNOSIS — E109 Type 1 diabetes mellitus without complications: Secondary | ICD-10-CM | POA: Diagnosis not present

## 2016-01-25 ENCOUNTER — Encounter: Payer: Self-pay | Admitting: *Deleted

## 2016-01-28 ENCOUNTER — Ambulatory Visit
Admission: RE | Admit: 2016-01-28 | Discharge: 2016-01-28 | Disposition: A | Discharge status: Home / Self Care | Payer: PPO | Source: Ambulatory Visit | Attending: Gastroenterology | Admitting: Gastroenterology

## 2016-01-28 ENCOUNTER — Encounter: Payer: Self-pay | Admitting: *Deleted

## 2016-01-28 ENCOUNTER — Encounter
Admission: RE | Disposition: A | Discharge status: Home / Self Care | Payer: Self-pay | Source: Ambulatory Visit | Attending: Gastroenterology

## 2016-01-28 ENCOUNTER — Ambulatory Visit: Payer: PPO | Admitting: Registered Nurse

## 2016-01-28 DIAGNOSIS — Z79899 Other long term (current) drug therapy: Secondary | ICD-10-CM | POA: Diagnosis not present

## 2016-01-28 DIAGNOSIS — Z8371 Family history of colonic polyps: Secondary | ICD-10-CM | POA: Diagnosis not present

## 2016-01-28 DIAGNOSIS — K219 Gastro-esophageal reflux disease without esophagitis: Secondary | ICD-10-CM | POA: Insufficient documentation

## 2016-01-28 DIAGNOSIS — Z882 Allergy status to sulfonamides status: Secondary | ICD-10-CM | POA: Diagnosis not present

## 2016-01-28 DIAGNOSIS — M1990 Unspecified osteoarthritis, unspecified site: Secondary | ICD-10-CM | POA: Diagnosis not present

## 2016-01-28 DIAGNOSIS — Z9641 Presence of insulin pump (external) (internal): Secondary | ICD-10-CM | POA: Insufficient documentation

## 2016-01-28 DIAGNOSIS — E039 Hypothyroidism, unspecified: Secondary | ICD-10-CM | POA: Diagnosis not present

## 2016-01-28 DIAGNOSIS — K5790 Diverticulosis of intestine, part unspecified, without perforation or abscess without bleeding: Secondary | ICD-10-CM | POA: Diagnosis not present

## 2016-01-28 DIAGNOSIS — Z881 Allergy status to other antibiotic agents status: Secondary | ICD-10-CM | POA: Diagnosis not present

## 2016-01-28 DIAGNOSIS — E785 Hyperlipidemia, unspecified: Secondary | ICD-10-CM | POA: Diagnosis not present

## 2016-01-28 DIAGNOSIS — K5730 Diverticulosis of large intestine without perforation or abscess without bleeding: Secondary | ICD-10-CM | POA: Diagnosis not present

## 2016-01-28 DIAGNOSIS — E119 Type 2 diabetes mellitus without complications: Secondary | ICD-10-CM | POA: Insufficient documentation

## 2016-01-28 DIAGNOSIS — Z794 Long term (current) use of insulin: Secondary | ICD-10-CM | POA: Insufficient documentation

## 2016-01-28 DIAGNOSIS — Z1211 Encounter for screening for malignant neoplasm of colon: Secondary | ICD-10-CM | POA: Diagnosis not present

## 2016-01-28 DIAGNOSIS — Z7982 Long term (current) use of aspirin: Secondary | ICD-10-CM | POA: Insufficient documentation

## 2016-01-28 DIAGNOSIS — Z880 Allergy status to penicillin: Secondary | ICD-10-CM | POA: Insufficient documentation

## 2016-01-28 DIAGNOSIS — Z800 Family history of malignant neoplasm of digestive organs: Secondary | ICD-10-CM | POA: Diagnosis not present

## 2016-01-28 HISTORY — DX: Unspecified asthma, uncomplicated: J45.909

## 2016-01-28 HISTORY — PX: COLONOSCOPY WITH PROPOFOL: SHX5780

## 2016-01-28 HISTORY — DX: Hyperlipidemia, unspecified: E78.5

## 2016-01-28 LAB — GLUCOSE, CAPILLARY: Glucose-Capillary: 119 mg/dL — ABNORMAL HIGH (ref 65–99)

## 2016-01-28 SURGERY — COLONOSCOPY WITH PROPOFOL
Anesthesia: General

## 2016-01-28 MED ORDER — MIDAZOLAM HCL 2 MG/2ML IJ SOLN
INTRAMUSCULAR | Status: DC | PRN
  Administered 2016-01-28: 1 mg via INTRAVENOUS

## 2016-01-28 MED ORDER — FENTANYL CITRATE (PF) 100 MCG/2ML IJ SOLN
INTRAMUSCULAR | Status: DC | PRN
  Administered 2016-01-28: 50 ug via INTRAVENOUS

## 2016-01-28 MED ORDER — PROPOFOL 500 MG/50ML IV EMUL
INTRAVENOUS | Status: DC | PRN
  Administered 2016-01-28: 50 ug/kg/min via INTRAVENOUS

## 2016-01-28 MED ORDER — SODIUM CHLORIDE 0.9 % IV SOLN
INTRAVENOUS | Status: DC
Start: 2016-01-28 — End: 2016-01-28
  Administered 2016-01-28: 1000 mL via INTRAVENOUS
  Administered 2016-01-28: 08:00:00 via INTRAVENOUS

## 2016-01-28 MED ORDER — SODIUM CHLORIDE 0.9 % IV SOLN: INTRAVENOUS | Status: DC

## 2016-01-28 NOTE — Anesthesia Postprocedure Evaluation (Signed)
Anesthesia Post Note  Patient: Elizabeth [name]  Procedure(s) Performed: Procedure(s) (LRB): COLONOSCOPY WITH PROPOFOL (N/A)  Patient location during evaluation: PACU Level of consciousness: awake Pain management: pain level controlled Respiratory status: nonlabored ventilation Cardiovascular status: stable Anesthetic complications: no    Last Vitals:  Filed Vitals:   01/28/16 0711 01/28/16 0821  BP: 149/85 102/58  Pulse: 82 74  Temp: 36.1 C 36.2 C  Resp: 18 20    Last Pain: There were no vitals filed for this visit.               VAN STAVEREN,Vikas Wegmann

## 2016-01-28 NOTE — H&P (Signed)
Primary Care Physician:  Madelyn Brunner, MD Primary Gastroenterologist:  Dr. Candace Cruise  Pre-Procedure History & Physical: HPI:  Elizabeth [name] is a 75 y.o. female is here for an colonoscopy.   Past Medical History  Diagnosis Date  . Hypothyroidism   . Diabetes mellitus without complication ([place])   . Heart murmur   . GERD (gastroesophageal reflux disease)     H/O  . Arthritis   . PONV (postoperative nausea and vomiting)   . Hx of cervical spine surgery   . Insulin pump in place   . Asthma   . Hyperlipidemia     Past Surgical History  Procedure Laterality Date  . Abdominal hysterectomy    . Cholecystectomy    . Septoplasty    . Menisectomy    . Bladder surgery    . Knee arthroscopy Left 09/17/2015    Procedure: ARTHROSCOPY KNEE,partial medial menisectomy, chondroplasty medial patella femoral.;  Surgeon: Dereck Leep, MD;  Location: [facility] ORS;  Service: Orthopedics;  Laterality: Left;    Prior to Admission medications   Medication Sig Start Date End Date Taking? Authorizing Provider  glucagon 1 MG injection Inject 1 mg into the vein once as needed.   Yes Historical Provider, MD  aspirin EC 81 MG tablet Take by mouth.    Historical Provider, MD  BIOTIN PO Take 1 tablet by mouth daily.     Historical Provider, MD  Calcium Carb-Cholecalciferol (CALCIUM + D3) 600-200 MG-UNIT TABS Take 2 tablets by mouth daily.    Historical Provider, MD  diphenhydrAMINE (SOMINEX) 25 MG tablet Take 25 mg by mouth at bedtime as needed for sleep.    Historical Provider, MD  guaiFENesin (MUCINEX) 600 MG 12 hr tablet Take by mouth 2 (two) times daily as needed.    Historical Provider, MD  HYDROcodone-acetaminophen (NORCO) 5-325 MG tablet Take 1-2 tablets by mouth every 4 (four) hours as needed for moderate pain. 09/17/15   Dereck Leep, MD  levothyroxine (SYNTHROID, LEVOTHROID) 100 MCG tablet Take 100 mcg by mouth daily before breakfast.  04/12/14   Historical Provider, MD  Naproxen Sodium  (ALEVE) 220 MG CAPS Take 2 capsules by mouth as needed.    Historical Provider, MD  NOVOLOG 100 UNIT/ML injection 60 Units by Pump Prime route daily. VIA INSULIN PUMP 11/20/14   Historical Provider, MD  rosuvastatin (CRESTOR) 10 MG tablet Take 10 mg by mouth at bedtime.  04/12/14   Historical Provider, MD  vitamin B-12 (CYANOCOBALAMIN) 1000 MCG tablet Take 1,000 mcg by mouth daily.    Historical Provider, MD    Allergies as of 01/21/2016 - Review Complete 09/17/2015  Allergen Reaction Noted  . Amoxicillin Nausea And Vomiting 12/04/2014  . Ceclor [cefaclor]  09/04/2015  . Neosporin [neomycin-bacitracin zn-polymyx]  09/04/2015  . Niaspan [niacin er] Other (See Comments) 09/04/2015  . Penicillins Nausea And Vomiting 09/04/2015  . Polysorbate Other (See Comments) 12/04/2014  . Relafen [nabumetone]  09/04/2015  . Statins Other (See Comments) 09/04/2015  . Sulfasalazine  09/04/2015  . Band-aid plus antibiotic [bacitracin-polymyxin b]  09/04/2015  . Clindamycin Rash 12/04/2014    Family History  Problem Relation Age of Onset  . Breast cancer Maternal Aunt 78  . Breast cancer Paternal Aunt 90    Social History   Social History  . Marital Status: Married    Spouse Name: N/A  . Number of Children: N/A  . Years of Education: N/A   Occupational History  . Not on file.  Social History Main Topics  . Smoking status: Never Smoker   . Smokeless tobacco: Never Used  . Alcohol Use: No  . Drug Use: No  . Sexual Activity: Not on file   Other Topics Concern  . Not on file   Social History Narrative    Review of Systems: See HPI, otherwise negative ROS  Physical Exam: BP 149/85 mmHg  Pulse 82  Temp(Src) 97 F (36.1 C) (Tympanic)  Resp 18  Ht 5\' 3"  (1.6 m)  Wt 70.308 kg (155 lb)  BMI 27.46 kg/m2  SpO2 100% General:   Alert,  pleasant and cooperative in NAD Head:  Normocephalic and atraumatic. Neck:  Supple; no masses or thyromegaly. Lungs:  Clear throughout to auscultation.     Heart:  Regular rate and rhythm. Abdomen:  Soft, nontender and nondistended. Normal bowel sounds, without guarding, and without rebound.   Neurologic:  Alert and  oriented x4;  grossly normal neurologically.  Impression/Plan: Elizabeth [name] is here for an colonoscopy to be performed for screening, family hx of colon cancer  Risks, benefits, limitations, and alternatives regarding  colonoscopy have been reviewed with the patient.  Questions have been answered.  All parties agreeable.   Elizabeth [name], Elizabeth Dawn, MD  01/28/2016, 8:03 AM

## 2016-01-28 NOTE — Anesthesia Preprocedure Evaluation (Signed)
Anesthesia Evaluation  Patient identified by MRN, date of birth, ID band Patient awake    Reviewed: Allergy & Precautions, NPO status , Patient's Chart, lab work & pertinent test results  History of Anesthesia Complications (+) PONV  Airway Mallampati: II       Dental  (+) Teeth Intact   Pulmonary asthma ,    breath sounds clear to auscultation       Cardiovascular Exercise Tolerance: Good  Rhythm:Regular Rate:Normal     Neuro/Psych    GI/Hepatic Neg liver ROS, GERD  Medicated,  Endo/Other  diabetes, Well Controlled, Type 1Hypothyroidism   Renal/GU negative Renal ROS     Musculoskeletal   Abdominal Normal abdominal exam  (+)   Peds  Hematology   Anesthesia Other Findings   Reproductive/Obstetrics                             Anesthesia Physical Anesthesia Plan  ASA: II  Anesthesia Plan: General   Post-op Pain Management:    Induction: Intravenous  Airway Management Planned: Natural Airway and Nasal Cannula  Additional Equipment:   Intra-op Plan:   Post-operative Plan:   Informed Consent: I have reviewed the patients History and Physical, chart, labs and discussed the procedure including the risks, benefits and alternatives for the proposed anesthesia with the patient or authorized representative who has indicated his/her understanding and acceptance.     Plan Discussed with: CRNA  Anesthesia Plan Comments:         Anesthesia Quick Evaluation

## 2016-01-28 NOTE — Transfer of Care (Signed)
Immediate Anesthesia Transfer of Care Note  Patient: Elizabeth [name]  Procedure(s) Performed: Procedure(s): COLONOSCOPY WITH PROPOFOL (N/A)  Patient Location: PACU  Anesthesia Type:General  Level of Consciousness: awake  Airway & Oxygen Therapy: Patient connected to nasal cannula oxygen  Post-op Assessment: Report given to RN  Post vital signs: Reviewed and stable  Last Vitals:  Filed Vitals:   01/28/16 0711 01/28/16 0821  BP: 149/85 126/64  Pulse: 82 78  Temp: 36.1 C 36.7 C  Resp: 18 14    Complications: No apparent anesthesia complications

## 2016-01-28 NOTE — Op Note (Signed)
[facility] Gastroenterology Patient Name: Elizabeth [name] Procedure Date: 01/28/2016 8:00 AM MRN: [id number] Account #: [id number] Date of Birth: [birth year] Admit Type: Outpatient Age: 75 Room: [facility] ENDO ROOM 4 Gender: Female Note Status: Finalized Procedure:            Colonoscopy Indications:          Colon cancer screening in patient at increased risk:                        Family history of 1st-degree relative with colon polyps Providers:            Lupita Dawn. Candace Cruise, MD Referring MD:         Hewitt Blade. Sarina Ser, MD (Referring MD) Medicines:            Monitored Anesthesia Care Complications:        No immediate complications. Procedure:            Pre-Anesthesia Assessment:                       - Prior to the procedure, a History and Physical was                        performed, and patient medications, allergies and                        sensitivities were reviewed. The patient's tolerance of                        previous anesthesia was reviewed.                       - The risks and benefits of the procedure and the                        sedation options and risks were discussed with the                        patient. All questions were answered and informed                        consent was obtained.                       - After reviewing the risks and benefits, the patient                        was deemed in satisfactory condition to undergo the                        procedure.                       After obtaining informed consent, the colonoscope was                        passed under direct vision. Throughout the procedure,                        the patient's blood pressure, pulse, and oxygen  saturations were monitored continuously. The                        Colonoscope was introduced through the anus and                        advanced to the the cecum, identified by appendiceal                        orifice and  ileocecal valve. The colonoscopy was                        performed with moderate difficulty due to a tortuous                        colon. The patient tolerated the procedure well. The                        quality of the bowel preparation was good. Findings:      A few small-mouthed diverticula were found in the sigmoid colon.      Had tortuous colon.      The exam was otherwise without abnormality. Impression:           - Diverticulosis in the sigmoid colon.                       - The examination was otherwise normal.                       - No specimens collected. Recommendation:       - Discharge patient to home.                       - The findings and recommendations were discussed with                        the patient. Procedure Code(s):    --- Professional ---                       [phone], Colonoscopy, flexible; diagnostic, including                        collection of specimen(s) by brushing or washing, when                        performed (separate procedure) Diagnosis Code(s):    --- Professional ---                       Z83.71, Family history of colonic polyps                       K57.30, Diverticulosis of large intestine without                        perforation or abscess without bleeding CPT copyright 2016 American Medical Association. All rights reserved. The codes documented in this report are preliminary and upon coder review may  be revised to meet current compliance requirements. Hulen Luster, MD 01/28/2016 8:21:54 AM This report has been signed electronically. Number of Addenda: 0 Note Initiated On: 01/28/2016 8:00 AM Scope  Withdrawal Time: 0 hours 4 minutes 1 second  Total Procedure Duration: 0 hours 11 minutes 17 seconds       [facility]

## 2016-01-29 ENCOUNTER — Encounter: Payer: Self-pay | Admitting: Gastroenterology

## 2016-02-12 DIAGNOSIS — H2513 Age-related nuclear cataract, bilateral: Secondary | ICD-10-CM | POA: Diagnosis not present

## 2016-02-12 DIAGNOSIS — H43822 Vitreomacular adhesion, left eye: Secondary | ICD-10-CM | POA: Diagnosis not present

## 2016-02-19 NOTE — Pre-Procedure Instructions (Signed)
Spoke to [place] at [facility] regarding allergy flags after entering Ophthalmic gel. Ok to proceed.

## 2016-02-21 ENCOUNTER — Encounter: Payer: Self-pay | Admitting: *Deleted

## 2016-02-21 ENCOUNTER — Ambulatory Visit
Admission: RE | Admit: 2016-02-21 | Discharge: 2016-02-21 | Disposition: A | Discharge status: Home / Self Care | Payer: PPO | Source: Ambulatory Visit | Attending: Ophthalmology | Admitting: Ophthalmology

## 2016-02-21 ENCOUNTER — Ambulatory Visit: Payer: PPO | Admitting: Anesthesiology

## 2016-02-21 ENCOUNTER — Encounter
Admission: RE | Disposition: A | Discharge status: Home / Self Care | Payer: Self-pay | Source: Ambulatory Visit | Attending: Ophthalmology

## 2016-02-21 DIAGNOSIS — K219 Gastro-esophageal reflux disease without esophagitis: Secondary | ICD-10-CM | POA: Insufficient documentation

## 2016-02-21 DIAGNOSIS — Z7984 Long term (current) use of oral hypoglycemic drugs: Secondary | ICD-10-CM | POA: Diagnosis not present

## 2016-02-21 DIAGNOSIS — H2511 Age-related nuclear cataract, right eye: Secondary | ICD-10-CM | POA: Insufficient documentation

## 2016-02-21 DIAGNOSIS — D649 Anemia, unspecified: Secondary | ICD-10-CM | POA: Diagnosis not present

## 2016-02-21 DIAGNOSIS — Z880 Allergy status to penicillin: Secondary | ICD-10-CM | POA: Insufficient documentation

## 2016-02-21 DIAGNOSIS — E119 Type 2 diabetes mellitus without complications: Secondary | ICD-10-CM | POA: Insufficient documentation

## 2016-02-21 DIAGNOSIS — J45909 Unspecified asthma, uncomplicated: Secondary | ICD-10-CM | POA: Insufficient documentation

## 2016-02-21 DIAGNOSIS — Z882 Allergy status to sulfonamides status: Secondary | ICD-10-CM | POA: Insufficient documentation

## 2016-02-21 DIAGNOSIS — H2513 Age-related nuclear cataract, bilateral: Secondary | ICD-10-CM | POA: Diagnosis not present

## 2016-02-21 DIAGNOSIS — Z881 Allergy status to other antibiotic agents status: Secondary | ICD-10-CM | POA: Insufficient documentation

## 2016-02-21 HISTORY — PX: CATARACT EXTRACTION W/PHACO: SHX586

## 2016-02-21 HISTORY — DX: Anemia, unspecified: D64.9

## 2016-02-21 LAB — GLUCOSE, CAPILLARY: GLUCOSE-CAPILLARY: 171 mg/dL — AB (ref 65–99)

## 2016-02-21 SURGERY — PHACOEMULSIFICATION, CATARACT, WITH IOL INSERTION
Anesthesia: Monitor Anesthesia Care | Site: Eye | Laterality: Right | Wound class: Clean

## 2016-02-21 MED ORDER — NEOMYCIN-POLYMYXIN-DEXAMETH 0.1 % OP OINT
TOPICAL_OINTMENT | OPHTHALMIC | Status: DC | PRN
  Administered 2016-02-21: 1 via OPHTHALMIC

## 2016-02-21 MED ORDER — TETRACAINE HCL 0.5 % OP SOLN
1.0000 [drp] | Freq: Once | OPHTHALMIC | Status: AC
  Administered 2016-02-21: 1 [drp] via OPHTHALMIC

## 2016-02-21 MED ORDER — POVIDONE-IODINE 5 % OP SOLN
1.0000 | Freq: Once | OPHTHALMIC | Status: AC
Start: 2016-02-21 — End: 2016-02-21
  Administered 2016-02-21: 1 via OPHTHALMIC

## 2016-02-21 MED ORDER — NA HYALUR & NA CHOND-NA HYALUR 0.4-0.35 ML IO KIT
PACK | INTRAOCULAR | Status: DC | PRN
  Administered 2016-02-21: 1.5 mL via INTRAOCULAR

## 2016-02-21 MED ORDER — POVIDONE-IODINE 5 % OP SOLN
OPHTHALMIC | Status: AC
  Administered 2016-02-21: 1 via OPHTHALMIC
  Filled 2016-02-21: qty 30

## 2016-02-21 MED ORDER — MIDAZOLAM HCL 2 MG/2ML IJ SOLN
INTRAMUSCULAR | Status: DC | PRN
  Administered 2016-02-21: 2 mg via INTRAVENOUS

## 2016-02-21 MED ORDER — CARBACHOL 0.01 % IO SOLN
INTRAOCULAR | Status: DC | PRN
  Administered 2016-02-21: 0.5 mL via INTRAOCULAR

## 2016-02-21 MED ORDER — NEOMYCIN-POLYMYXIN-DEXAMETH 3.5-10000-0.1 OP OINT
TOPICAL_OINTMENT | OPHTHALMIC | Status: AC
  Filled 2016-02-21: qty 3.5

## 2016-02-21 MED ORDER — TETRACAINE HCL 0.5 % OP SOLN
OPHTHALMIC | Status: AC
  Administered 2016-02-21: 1 [drp] via OPHTHALMIC
  Filled 2016-02-21: qty 2

## 2016-02-21 MED ORDER — NA HYALUR & NA CHOND-NA HYALUR 0.55-0.5 ML IO KIT
PACK | INTRAOCULAR | Status: AC
  Filled 2016-02-21: qty 1.05

## 2016-02-21 MED ORDER — MOXIFLOXACIN HCL 0.5 % OP SOLN
OPHTHALMIC | Status: AC
  Filled 2016-02-21: qty 3

## 2016-02-21 MED ORDER — BSS IO SOLN
INTRAOCULAR | Status: DC | PRN
  Administered 2016-02-21: 250 mL via OPHTHALMIC

## 2016-02-21 MED ORDER — SODIUM CHLORIDE 0.9 % IV SOLN
INTRAVENOUS | Status: DC
  Administered 2016-02-21: 08:00:00 via INTRAVENOUS

## 2016-02-21 MED ORDER — EPINEPHRINE HCL 1 MG/ML IJ SOLN
INTRAMUSCULAR | Status: AC
  Filled 2016-02-21: qty 2

## 2016-02-21 MED ORDER — MOXIFLOXACIN HCL 0.5 % OP SOLN: 1.0000 [drp] | Freq: Once | OPHTHALMIC | Status: DC

## 2016-02-21 MED ORDER — ARMC OPHTHALMIC DILATING GEL
OPHTHALMIC | Status: AC
  Administered 2016-02-21: 1 via OPHTHALMIC
  Filled 2016-02-21: qty 0.25

## 2016-02-21 MED ORDER — LIDOCAINE HCL (PF) 4 % IJ SOLN
INTRAMUSCULAR | Status: AC
  Filled 2016-02-21: qty 5

## 2016-02-21 MED ORDER — CEFUROXIME OPHTHALMIC INJECTION 1 MG/0.1 ML
INJECTION | OPHTHALMIC | Status: AC
  Filled 2016-02-21: qty 0.1

## 2016-02-21 MED ORDER — CEFUROXIME OPHTHALMIC INJECTION 1 MG/0.1 ML
INJECTION | OPHTHALMIC | Status: DC | PRN
  Administered 2016-02-21: 0.1 mL via INTRACAMERAL

## 2016-02-21 MED ORDER — LIDOCAINE HCL (PF) 4 % IJ SOLN
INTRAOCULAR | Status: DC | PRN
  Administered 2016-02-21: 2 mL via OPHTHALMIC

## 2016-02-21 MED ORDER — ARMC OPHTHALMIC DILATING GEL
1.0000 "application " | OPHTHALMIC | Status: DC | PRN
  Administered 2016-02-21 (×2): 1 via OPHTHALMIC

## 2016-02-21 SURGICAL SUPPLY — 23 items
CANNULA ANT/CHMB 27GA (MISCELLANEOUS) ×3 IMPLANT
CUP MEDICINE 2OZ PLAST GRAD ST (MISCELLANEOUS) ×3 IMPLANT
GLOVE BIO SURGEON STRL SZ8 (GLOVE) ×3 IMPLANT
GLOVE BIOGEL M 6.5 STRL (GLOVE) ×3 IMPLANT
GLOVE SURG LX 7.5 STRW (GLOVE) ×2
GLOVE SURG LX STRL 7.5 STRW (GLOVE) ×1 IMPLANT
GOWN STRL REUS W/ TWL LRG LVL3 (GOWN DISPOSABLE) ×2 IMPLANT
GOWN STRL REUS W/TWL LRG LVL3 (GOWN DISPOSABLE) ×4
LENS IOL SYMFON TORIC 300 19.5 (Intraocular Lens) ×1 IMPLANT
LENS IOL SYMFONY TORIC 19.5 (Intraocular Lens) ×2 IMPLANT
LENS IOL SYMFONY TRC 300 19.5 (Intraocular Lens) ×1 IMPLANT
PACK CATARACT (MISCELLANEOUS) ×3 IMPLANT
PACK CATARACT BRASINGTON LX (MISCELLANEOUS) ×3 IMPLANT
PACK EYE AFTER SURG (MISCELLANEOUS) ×3 IMPLANT
SOL BSS BAG (MISCELLANEOUS) ×3
SOL PREP PVP 2OZ (MISCELLANEOUS) ×3
SOLUTION BSS BAG (MISCELLANEOUS) ×1 IMPLANT
SOLUTION PREP PVP 2OZ (MISCELLANEOUS) ×1 IMPLANT
SYR 3ML LL SCALE MARK (SYRINGE) ×3 IMPLANT
SYR 5ML LL (SYRINGE) ×3 IMPLANT
SYR TB 1ML 27GX1/2 LL (SYRINGE) ×3 IMPLANT
WATER STERILE IRR 1000ML POUR (IV SOLUTION) ×3 IMPLANT
WIPE NON LINTING 3.25X3.25 (MISCELLANEOUS) ×3 IMPLANT

## 2016-02-21 NOTE — Anesthesia Preprocedure Evaluation (Signed)
Anesthesia Evaluation  Patient identified by MRN, date of birth, ID band Patient awake    History of Anesthesia Complications (+) PONV  Airway Mallampati: II       Dental  (+) Teeth Intact   Pulmonary asthma ,    breath sounds clear to auscultation       Cardiovascular Exercise Tolerance: Good  Rhythm:Regular     Neuro/Psych negative neurological ROS     GI/Hepatic Neg liver ROS, GERD  ,  Endo/Other  diabetes, Well Controlled, Type 2, Oral Hypoglycemic AgentsHypothyroidism   Renal/GU negative Renal ROS     Musculoskeletal   Abdominal Normal abdominal exam  (+)   Peds negative pediatric ROS (+)  Hematology  (+) anemia ,   Anesthesia Other Findings   Reproductive/Obstetrics                             Anesthesia Physical Anesthesia Plan  ASA: II  Anesthesia Plan: MAC   Post-op Pain Management:    Induction: Intravenous  Airway Management Planned: Natural Airway and Nasal Cannula  Additional Equipment:   Intra-op Plan:   Post-operative Plan:   Informed Consent: I have reviewed the patients History and Physical, chart, labs and discussed the procedure including the risks, benefits and alternatives for the proposed anesthesia with the patient or authorized representative who has indicated his/her understanding and acceptance.     Plan Discussed with: CRNA  Anesthesia Plan Comments:         Anesthesia Quick Evaluation

## 2016-02-21 NOTE — Transfer of Care (Signed)
Immediate Anesthesia Transfer of Care Note  Patient: Elizabeth [name]  Procedure(s) Performed: Procedure(s) with comments: CATARACT EXTRACTION PHACO AND INTRAOCULAR LENS PLACEMENT (IOC) (Right) - Lot# [id number] H [place]: 00:50.6 AP%:17.0 CDE: 8.56  Patient Location: PACU and Short Stay  Anesthesia Type:MAC  Level of Consciousness: awake and patient cooperative  Airway & Oxygen Therapy: Patient Spontanous Breathing  Post-op Assessment: Report given to RN and Post -op Vital signs reviewed and stable  Post vital signs: Reviewed and stable  Last Vitals:  Filed Vitals:   02/21/16 0751  BP: 156/71  Pulse: 66  Temp: 36.6 C  Resp: 16    Complications: No apparent anesthesia complications

## 2016-02-21 NOTE — Anesthesia Postprocedure Evaluation (Signed)
Anesthesia Post Note  Patient: Elizabeth [name]  Procedure(s) Performed: Procedure(s) (LRB): CATARACT EXTRACTION PHACO AND INTRAOCULAR LENS PLACEMENT (IOC) (Right)  Patient location during evaluation: PACU Anesthesia Type: MAC Level of consciousness: awake Pain management: pain level controlled Vital Signs Assessment: post-procedure vital signs reviewed and stable Respiratory status: spontaneous breathing Cardiovascular status: stable Anesthetic complications: no    Last Vitals:  Filed Vitals:   02/21/16 0957 02/21/16 0959  BP: 161/71 156/65  Pulse: 64 66  Temp: 36.6 C 36.6 C  Resp:  16    Last Pain: There were no vitals filed for this visit.               VAN STAVEREN,Kealie Barrie

## 2016-02-21 NOTE — Discharge Instructions (Signed)
AMBULATORY SURGERY  DISCHARGE INSTRUCTIONS   1) The drugs that you were given will stay in your system until tomorrow so for the next 24 hours you should not:  A) Drive an automobile B) Make any legal decisions C) Drink any alcoholic beverage   2) You may resume regular meals tomorrow.  Today it is better to start with liquids and gradually work up to solid foods.  You may eat anything you prefer, but it is better to start with liquids, then soup and crackers, and gradually work up to solid foods.   3) Please notify your doctor immediately if you have any unusual bleeding, trouble breathing, redness and pain at the surgery site, drainage, fever, or pain not relieved by medication.    4) Additional Instructions:    Eye Surgery Discharge Instructions  Expect mild scratchy sensation or mild soreness. DO NOT RUB YOUR EYE!  The day of surgery:  Minimal physical activity, but bed rest is not required  No reading, computer work, or close hand work  No bending, lifting, or straining.  May watch TV  For 24 hours:  No driving, legal decisions, or alcoholic beverages  Safety precautions  Eat anything you prefer: It is better to start with liquids, then soup then solid foods.  _____ Eye patch should be worn until postoperative exam tomorrow.  ____ Solar shield eyeglasses should be worn for comfort in the sunlight/patch while sleeping  Resume all regular medications including aspirin or Coumadin if these were discontinued prior to surgery. You may shower, bathe, shave, or wash your hair. Tylenol may be taken for mild discomfort.  Call your doctor if you experience significant pain, nausea, or vomiting, fever > 101 or other signs of infection. [phone] or [phone] Specific instructions:  Follow-up Information    Follow up with Leandrew Koyanagi, MD.   Specialty:  Ophthalmology   Why:  April 7 at [facility] information:   [address]   [place] [address] [phone]         Please contact your physician with any problems or Same Day Surgery at [phone], Monday through Friday 6 am to 4 pm, or [place] at [facility] number at [phone].

## 2016-02-21 NOTE — Op Note (Signed)
LOCATION:  [place]   PREOPERATIVE DIAGNOSIS:  Nuclear sclerotic cataract of the right eye.  H25.11   POSTOPERATIVE DIAGNOSIS:  Nuclear sclerotic cataract of the right eye.   PROCEDURE:  Phacoemulsification with Toric posterior chamber intraocular lens placement of the right eye.   LENS:   Implant Name Type Inv. Item Serial No. Manufacturer Lot No. LRB No. Used  LENS IOL TORIC SYMFONY 19.5 - [id number] Intraocular Lens LENS IOL TORIC SYMFONY 19.5 [id number]   Right 1      Toric intraocular lens with 3.00 diopters of cylindrical power with axis orientation at 174 degrees.   ULTRASOUND TIME: 17 % of 0 minutes, 51 seconds.  CDE 8.6   SURGEON:  Wyonia Hough, MD   ANESTHESIA: Topical with tetracaine drops and 2% Xylocaine jelly, augmented with 1% preservative-free intracameral lidocaine. .   COMPLICATIONS:  None.   DESCRIPTION OF PROCEDURE:  The patient was identified in the holding room and transported to the operating suite and placed in the supine position under the operating microscope.  The right eye was identified as the operative eye, and it was prepped and draped in the usual sterile ophthalmic fashion.    A clear-corneal paracentesis incision was made at the 12:00 position.  0.5 ml of preservative-free 1% lidocaine was injected into the anterior chamber. The anterior chamber was filled with Viscoat.  A 2.4 millimeter near clear corneal incision was then made at the 9:00 position.  A cystotome and capsulorrhexis forceps were then used to make a curvilinear capsulorrhexis.  Hydrodissection and hydrodelineation were then performed using balanced salt solution.   Phacoemulsification was then used in stop and chop fashion to remove the lens, nucleus and epinucleus.  The remaining cortex was aspirated using the irrigation and aspiration handpiece.  Provisc viscoelastic was then placed into the capsular bag to distend it for lens placement.  The Verion digital  marker was used to align the implant at the intended axis.   A Toric lens was then injected into the capsular bag.  It was rotated clockwise until the axis marks on the lens were approximately 15 degrees in the counterclockwise direction to the intended alignment.  The viscoelastic was aspirated from the eye using the irrigation aspiration handpiece.  Then, a Koch spatula through the sideport incision was used to rotate the lens in a clockwise direction until the axis markings of the intraocular lens were lined up with the Verion alignment.  Balanced salt solution was then used to hydrate the wounds. Wounds were hydrated with balanced salt solution.  The anterior chamber was inflated to a physiologic pressure with balanced salt solution. Cefuroxime 0.1 ml of a 10mg /ml solution was injected into the anterior chamber for a dose of 1 mg of intracameral antibiotic at the completion of the case. Miostat was placed into the anterior chamber to constrict the pupil.  No wound leaks were noted.  Topical Vigamox drops and Maxitrol ointment were applied to the eye.  The patient was taken to the recovery room in stable condition without complications of anesthesia or surgery.  Negin Hegg 02/21/2016, 9:55 AM

## 2016-02-21 NOTE — H&P (Signed)
  The History and Physical notes are on paper, have been signed, and are to be scanned. The patient remains stable and unchanged from the H&P.   Previous H&P reviewed, patient examined, and there are no changes.  Elizabeth [name] 02/21/2016 8:39 AM

## 2016-03-18 ENCOUNTER — Encounter: Payer: Self-pay | Admitting: *Deleted

## 2016-03-18 DIAGNOSIS — H2512 Age-related nuclear cataract, left eye: Secondary | ICD-10-CM | POA: Diagnosis not present

## 2016-03-20 NOTE — Discharge Instructions (Signed)

## 2016-03-21 DIAGNOSIS — E139 Other specified diabetes mellitus without complications: Secondary | ICD-10-CM | POA: Diagnosis not present

## 2016-03-26 ENCOUNTER — Ambulatory Visit: Payer: PPO | Admitting: Anesthesiology

## 2016-03-26 ENCOUNTER — Encounter
Admission: RE | Disposition: A | Discharge status: Home / Self Care | Payer: Self-pay | Source: Ambulatory Visit | Attending: Ophthalmology

## 2016-03-26 ENCOUNTER — Ambulatory Visit
Admission: RE | Admit: 2016-03-26 | Discharge: 2016-03-26 | Disposition: A | Discharge status: Home / Self Care | Payer: PPO | Source: Ambulatory Visit | Attending: Ophthalmology | Admitting: Ophthalmology

## 2016-03-26 DIAGNOSIS — H2512 Age-related nuclear cataract, left eye: Secondary | ICD-10-CM | POA: Insufficient documentation

## 2016-03-26 DIAGNOSIS — M1990 Unspecified osteoarthritis, unspecified site: Secondary | ICD-10-CM | POA: Insufficient documentation

## 2016-03-26 DIAGNOSIS — J329 Chronic sinusitis, unspecified: Secondary | ICD-10-CM | POA: Diagnosis not present

## 2016-03-26 DIAGNOSIS — Z881 Allergy status to other antibiotic agents status: Secondary | ICD-10-CM | POA: Insufficient documentation

## 2016-03-26 DIAGNOSIS — R011 Cardiac murmur, unspecified: Secondary | ICD-10-CM | POA: Insufficient documentation

## 2016-03-26 DIAGNOSIS — M7989 Other specified soft tissue disorders: Secondary | ICD-10-CM | POA: Insufficient documentation

## 2016-03-26 DIAGNOSIS — K5790 Diverticulosis of intestine, part unspecified, without perforation or abscess without bleeding: Secondary | ICD-10-CM | POA: Insufficient documentation

## 2016-03-26 DIAGNOSIS — E079 Disorder of thyroid, unspecified: Secondary | ICD-10-CM | POA: Insufficient documentation

## 2016-03-26 DIAGNOSIS — J45909 Unspecified asthma, uncomplicated: Secondary | ICD-10-CM | POA: Insufficient documentation

## 2016-03-26 DIAGNOSIS — Z888 Allergy status to other drugs, medicaments and biological substances status: Secondary | ICD-10-CM | POA: Diagnosis not present

## 2016-03-26 DIAGNOSIS — Z9841 Cataract extraction status, right eye: Secondary | ICD-10-CM | POA: Insufficient documentation

## 2016-03-26 DIAGNOSIS — Z882 Allergy status to sulfonamides status: Secondary | ICD-10-CM | POA: Insufficient documentation

## 2016-03-26 DIAGNOSIS — D649 Anemia, unspecified: Secondary | ICD-10-CM | POA: Diagnosis not present

## 2016-03-26 DIAGNOSIS — E7800 Pure hypercholesterolemia, unspecified: Secondary | ICD-10-CM | POA: Insufficient documentation

## 2016-03-26 DIAGNOSIS — E119 Type 2 diabetes mellitus without complications: Secondary | ICD-10-CM | POA: Diagnosis not present

## 2016-03-26 HISTORY — PX: CATARACT EXTRACTION W/PHACO: SHX586

## 2016-03-26 LAB — GLUCOSE, CAPILLARY
GLUCOSE-CAPILLARY: 227 mg/dL — AB (ref 65–99)
Glucose-Capillary: 241 mg/dL — ABNORMAL HIGH (ref 65–99)

## 2016-03-26 SURGERY — PHACOEMULSIFICATION, CATARACT, WITH IOL INSERTION
Anesthesia: Monitor Anesthesia Care | Site: Eye | Laterality: Left | Wound class: Clean

## 2016-03-26 MED ORDER — TETRACAINE HCL 0.5 % OP SOLN
1.0000 [drp] | OPHTHALMIC | Status: DC | PRN
  Administered 2016-03-26: 1 [drp] via OPHTHALMIC

## 2016-03-26 MED ORDER — POVIDONE-IODINE 5 % OP SOLN
1.0000 "application " | OPHTHALMIC | Status: DC | PRN
  Administered 2016-03-26: 1 via OPHTHALMIC

## 2016-03-26 MED ORDER — NA HYALUR & NA CHOND-NA HYALUR 0.4-0.35 ML IO KIT
PACK | INTRAOCULAR | Status: DC | PRN
  Administered 2016-03-26: 1 mL via INTRAOCULAR

## 2016-03-26 MED ORDER — TIMOLOL MALEATE 0.5 % OP SOLN
OPHTHALMIC | Status: DC | PRN
  Administered 2016-03-26: 1 [drp] via OPHTHALMIC

## 2016-03-26 MED ORDER — BRIMONIDINE TARTRATE 0.2 % OP SOLN
OPHTHALMIC | Status: DC | PRN
  Administered 2016-03-26: 1 [drp] via OPHTHALMIC

## 2016-03-26 MED ORDER — MIDAZOLAM HCL 2 MG/2ML IJ SOLN
INTRAMUSCULAR | Status: DC | PRN
  Administered 2016-03-26 (×2): 1 mg via INTRAVENOUS

## 2016-03-26 MED ORDER — FENTANYL CITRATE (PF) 100 MCG/2ML IJ SOLN
INTRAMUSCULAR | Status: DC | PRN
  Administered 2016-03-26 (×2): 50 ug via INTRAVENOUS

## 2016-03-26 MED ORDER — LIDOCAINE HCL (PF) 4 % IJ SOLN
INTRAOCULAR | Status: DC | PRN
  Administered 2016-03-26: 1 mL via OPHTHALMIC

## 2016-03-26 MED ORDER — CEFUROXIME OPHTHALMIC INJECTION 1 MG/0.1 ML
INJECTION | OPHTHALMIC | Status: DC | PRN
  Administered 2016-03-26: 0.1 mL via OPHTHALMIC

## 2016-03-26 MED ORDER — ARMC OPHTHALMIC DILATING GEL
1.0000 "application " | OPHTHALMIC | Status: DC | PRN
  Administered 2016-03-26 (×2): 1 via OPHTHALMIC

## 2016-03-26 MED ORDER — EPINEPHRINE HCL 1 MG/ML IJ SOLN
INTRAOCULAR | Status: DC | PRN
  Administered 2016-03-26: 86 mL via OPHTHALMIC

## 2016-03-26 SURGICAL SUPPLY — 23 items
CANNULA ANT/CHMB 27GA (MISCELLANEOUS) ×3 IMPLANT
CARTRIDGE ABBOTT (MISCELLANEOUS) IMPLANT
GLOVE SURG LX 7.5 STRW (GLOVE) ×2
GLOVE SURG LX STRL 7.5 STRW (GLOVE) ×1 IMPLANT
GLOVE SURG TRIUMPH 8.0 PF LTX (GLOVE) ×3 IMPLANT
GOWN STRL REUS W/ TWL LRG LVL3 (GOWN DISPOSABLE) ×2 IMPLANT
GOWN STRL REUS W/TWL LRG LVL3 (GOWN DISPOSABLE) ×4
LENS IOL SYMFON TORIC 375 19.5 (Intraocular Lens) ×1 IMPLANT
LENS IOL SYMFONY TORIC 19.5 (Intraocular Lens) ×2 IMPLANT
LENS IOL SYMFONY TRC 375 19.5 (Intraocular Lens) ×1 IMPLANT
MARKER SKIN DUAL TIP RULER LAB (MISCELLANEOUS) ×3 IMPLANT
NDL RETROBULBAR .5 NSTRL (NEEDLE) IMPLANT
PACK CATARACT BRASINGTON (MISCELLANEOUS) ×3 IMPLANT
PACK EYE AFTER SURG (MISCELLANEOUS) ×3 IMPLANT
PACK OPTHALMIC (MISCELLANEOUS) ×3 IMPLANT
RING MALYGIN 7.0 (MISCELLANEOUS) IMPLANT
SUT ETHILON 10-0 CS-B-6CS-B-6 (SUTURE)
SUT VICRYL  9 0 (SUTURE)
SUT VICRYL 9 0 (SUTURE) IMPLANT
SUTURE EHLN 10-0 CS-B-6CS-B-6 (SUTURE) IMPLANT
SYR TB 1ML LUER SLIP (SYRINGE) ×3 IMPLANT
WATER STERILE IRR 250ML POUR (IV SOLUTION) ×3 IMPLANT
WIPE NON LINTING 3.25X3.25 (MISCELLANEOUS) ×3 IMPLANT

## 2016-03-26 NOTE — Op Note (Signed)
LOCATION:  [facility]   PREOPERATIVE DIAGNOSIS:  Nuclear sclerotic cataract of the left eye.  H25.12  POSTOPERATIVE DIAGNOSIS:  Nuclear sclerotic cataract of the left eye.   PROCEDURE:  Phacoemulsification with Toric posterior chamber intraocular lens placement of the left eye.   LENS:  Implant Name Type Inv. Item Serial No. Manufacturer Lot No. LRB No. Used  TECHNIS SYMPHONY IOL     [id number]   Left 1   19.5 D Symfony Toric intraocular lens with 3.75 diopters of cylindrical power with axis orientation at 8 degrees.   ULTRASOUND TIME: 14 % of 1 minutes, 24 seconds.  CDE 11.6   SURGEON:  Wyonia Hough, MD   ANESTHESIA:  Topical with tetracaine drops and 2% Xylocaine jelly, augmented with 1% preservative-free intracameral lidocaine.  COMPLICATIONS:  None.   DESCRIPTION OF PROCEDURE:  The patient was identified in the holding room and transported to the operating suite and placed in the supine position under the operating microscope.  The left eye was identified as the operative eye, and it was prepped and draped in the usual sterile ophthalmic fashion.    A clear-corneal paracentesis incision was made at the 1:30 position.  0.5 ml of preservative-free 1% lidocaine was injected into the anterior chamber. The anterior chamber was filled with Viscoat.  A 2.4 millimeter near clear corneal incision was then made at the 10:30 position.  A cystotome and capsulorrhexis forceps were then used to make a curvilinear capsulorrhexis.  Hydrodissection and hydrodelineation were then performed using balanced salt solution.   Phacoemulsification was then used in stop and chop fashion to remove the lens, nucleus and epinucleus.  The remaining cortex was aspirated using the irrigation and aspiration handpiece.  Provisc viscoelastic was then placed into the capsular bag to distend it for lens placement.  The Verion digital marker was used to align the implant at the intended  axis.   A 19.5 diopter lens was then injected into the capsular bag.  It was rotated clockwise until the axis marks on the lens were approximately 15 degrees in the counterclockwise direction to the intended alignment.  The viscoelastic was aspirated from the eye using the irrigation aspiration handpiece.  Then, a Koch spatula through the sideport incision was used to rotate the lens in a clockwise direction until the axis markings of the intraocular lens were lined up with the Verion alignment.  Balanced salt solution was then used to hydrate the wounds. Cefuroxime 0.1 ml of a 10mg /ml solution was injected into the anterior chamber for a dose of 1 mg of intracameral antibiotic at the completion of the case.    The eye was noted to have a physiologic pressure and there was no wound leak noted.   Timolol and Brimonidine drops were applied to the eye.  The patient was taken to the recovery room in stable condition having had no complications of anesthesia or surgery.  Duval Macleod 03/26/2016, 9:20 AM

## 2016-03-26 NOTE — Transfer of Care (Signed)
Immediate Anesthesia Transfer of Care Note  Patient: Elizabeth [name]  Procedure(s) Performed: Procedure(s) with comments: CATARACT EXTRACTION PHACO AND INTRAOCULAR LENS PLACEMENT (IOC) left eye (Left) - DIABETIC - insulin pump SYMFONY TORIC LENS  Patient Location: PACU  Anesthesia Type: MAC  Level of Consciousness: awake, alert  and patient cooperative  Airway and Oxygen Therapy: Patient Spontanous Breathing and Patient connected to supplemental oxygen  Post-op Assessment: Post-op Vital signs reviewed, Patient's Cardiovascular Status Stable, Respiratory Function Stable, Patent Airway and No signs of Nausea or vomiting  Post-op Vital Signs: Reviewed and stable  Complications: No apparent anesthesia complications

## 2016-03-26 NOTE — Anesthesia Procedure Notes (Signed)
Procedure Name: MAC Performed by: Paisleigh Maroney Pre-anesthesia Checklist: Patient identified, Emergency Drugs available, Suction available, Patient being monitored and Timeout performed Patient Re-evaluated:Patient Re-evaluated prior to inductionOxygen Delivery Method: Nasal cannula       

## 2016-03-26 NOTE — Anesthesia Postprocedure Evaluation (Signed)
Anesthesia Post Note  Patient: Elizabeth [name]  Procedure(s) Performed: Procedure(s) (LRB): CATARACT EXTRACTION PHACO AND INTRAOCULAR LENS PLACEMENT (IOC) left eye (Left)  Patient location during evaluation: PACU Anesthesia Type: MAC Level of consciousness: awake and alert Pain management: pain level controlled Vital Signs Assessment: post-procedure vital signs reviewed and stable Respiratory status: spontaneous breathing, nonlabored ventilation, respiratory function stable and patient connected to nasal cannula oxygen Cardiovascular status: blood pressure returned to baseline and stable Postop Assessment: no signs of nausea or vomiting Anesthetic complications: no    Alisa Graff

## 2016-03-26 NOTE — Anesthesia Preprocedure Evaluation (Signed)
Anesthesia Evaluation  Patient identified by MRN, date of birth, ID band Patient awake    History of Anesthesia Complications (+) PONV and history of anesthetic complications  Airway Mallampati: II       Dental  (+) Teeth Intact   Pulmonary asthma ,    breath sounds clear to auscultation       Cardiovascular Exercise Tolerance: Good  Rhythm:Regular     Neuro/Psych negative neurological ROS     GI/Hepatic Neg liver ROS, GERD  ,  Endo/Other  diabetes, Well Controlled, Type 2, Oral Hypoglycemic AgentsHypothyroidism   Renal/GU negative Renal ROS     Musculoskeletal   Abdominal   Peds  Hematology  (+) anemia ,   Anesthesia Other Findings   Reproductive/Obstetrics negative OB ROS                             Anesthesia Physical Anesthesia Plan  ASA: III  Anesthesia Plan: MAC   Post-op Pain Management:    Induction:   Airway Management Planned:   Additional Equipment:   Intra-op Plan:   Post-operative Plan:   Informed Consent:   Plan Discussed with: CRNA  Anesthesia Plan Comments:         Anesthesia Quick Evaluation

## 2016-03-26 NOTE — H&P (Signed)
  The History and Physical notes are on paper, have been signed, and are to be scanned. The patient remains stable and unchanged from the H&P.   Previous H&P reviewed, patient examined, and there are no changes.  Elizabeth [name] 03/26/2016 8:46 AM

## 2016-03-27 ENCOUNTER — Encounter: Payer: Self-pay | Admitting: Ophthalmology

## 2016-03-28 DIAGNOSIS — J4520 Mild intermittent asthma, uncomplicated: Secondary | ICD-10-CM | POA: Diagnosis not present

## 2016-03-28 DIAGNOSIS — E139 Other specified diabetes mellitus without complications: Secondary | ICD-10-CM | POA: Diagnosis not present

## 2016-03-28 DIAGNOSIS — E034 Atrophy of thyroid (acquired): Secondary | ICD-10-CM | POA: Diagnosis not present

## 2016-05-28 DIAGNOSIS — B9689 Other specified bacterial agents as the cause of diseases classified elsewhere: Secondary | ICD-10-CM | POA: Diagnosis not present

## 2016-05-28 DIAGNOSIS — J019 Acute sinusitis, unspecified: Secondary | ICD-10-CM | POA: Diagnosis not present

## 2016-06-11 DIAGNOSIS — E039 Hypothyroidism, unspecified: Secondary | ICD-10-CM | POA: Diagnosis not present

## 2016-06-11 DIAGNOSIS — E10649 Type 1 diabetes mellitus with hypoglycemia without coma: Secondary | ICD-10-CM | POA: Diagnosis not present

## 2016-06-19 DIAGNOSIS — Z4681 Encounter for fitting and adjustment of insulin pump: Secondary | ICD-10-CM | POA: Diagnosis not present

## 2016-06-19 DIAGNOSIS — E785 Hyperlipidemia, unspecified: Secondary | ICD-10-CM | POA: Diagnosis not present

## 2016-06-19 DIAGNOSIS — E039 Hypothyroidism, unspecified: Secondary | ICD-10-CM | POA: Diagnosis not present

## 2016-06-19 DIAGNOSIS — E538 Deficiency of other specified B group vitamins: Secondary | ICD-10-CM | POA: Diagnosis not present

## 2016-06-19 DIAGNOSIS — E10649 Type 1 diabetes mellitus with hypoglycemia without coma: Secondary | ICD-10-CM | POA: Diagnosis not present

## 2016-07-01 DIAGNOSIS — L821 Other seborrheic keratosis: Secondary | ICD-10-CM | POA: Diagnosis not present

## 2016-07-01 DIAGNOSIS — B078 Other viral warts: Secondary | ICD-10-CM | POA: Diagnosis not present

## 2016-07-01 DIAGNOSIS — D692 Other nonthrombocytopenic purpura: Secondary | ICD-10-CM | POA: Diagnosis not present

## 2016-07-01 DIAGNOSIS — X32XXXA Exposure to sunlight, initial encounter: Secondary | ICD-10-CM | POA: Diagnosis not present

## 2016-09-15 ENCOUNTER — Ambulatory Visit: Payer: Commercial Managed Care - HMO | Admitting: Podiatry

## 2016-09-23 DIAGNOSIS — E109 Type 1 diabetes mellitus without complications: Secondary | ICD-10-CM | POA: Diagnosis not present

## 2016-09-30 ENCOUNTER — Other Ambulatory Visit: Payer: Self-pay | Admitting: Internal Medicine

## 2016-09-30 DIAGNOSIS — Z0000 Encounter for general adult medical examination without abnormal findings: Secondary | ICD-10-CM | POA: Diagnosis not present

## 2016-09-30 DIAGNOSIS — Z0001 Encounter for general adult medical examination with abnormal findings: Secondary | ICD-10-CM | POA: Diagnosis not present

## 2016-09-30 DIAGNOSIS — Z1231 Encounter for screening mammogram for malignant neoplasm of breast: Secondary | ICD-10-CM | POA: Diagnosis not present

## 2016-09-30 DIAGNOSIS — E034 Atrophy of thyroid (acquired): Secondary | ICD-10-CM | POA: Diagnosis not present

## 2016-09-30 DIAGNOSIS — E538 Deficiency of other specified B group vitamins: Secondary | ICD-10-CM | POA: Diagnosis not present

## 2016-09-30 DIAGNOSIS — E7800 Pure hypercholesterolemia, unspecified: Secondary | ICD-10-CM | POA: Diagnosis not present

## 2016-09-30 DIAGNOSIS — E109 Type 1 diabetes mellitus without complications: Secondary | ICD-10-CM | POA: Diagnosis not present

## 2016-10-21 DIAGNOSIS — Z961 Presence of intraocular lens: Secondary | ICD-10-CM | POA: Diagnosis not present

## 2016-10-27 DIAGNOSIS — N898 Other specified noninflammatory disorders of vagina: Secondary | ICD-10-CM | POA: Diagnosis not present

## 2016-10-28 DIAGNOSIS — E10649 Type 1 diabetes mellitus with hypoglycemia without coma: Secondary | ICD-10-CM | POA: Diagnosis not present

## 2016-10-28 DIAGNOSIS — M25562 Pain in left knee: Secondary | ICD-10-CM | POA: Diagnosis not present

## 2016-10-28 DIAGNOSIS — M1732 Unilateral post-traumatic osteoarthritis, left knee: Secondary | ICD-10-CM | POA: Diagnosis not present

## 2016-11-05 DIAGNOSIS — E10649 Type 1 diabetes mellitus with hypoglycemia without coma: Secondary | ICD-10-CM | POA: Diagnosis not present

## 2016-11-12 ENCOUNTER — Ambulatory Visit
Admission: RE | Admit: 2016-11-12 | Discharge: 2016-11-12 | Disposition: A | Discharge status: Home / Self Care | Payer: PPO | Source: Ambulatory Visit | Attending: Internal Medicine | Admitting: Internal Medicine

## 2016-11-12 DIAGNOSIS — Z1231 Encounter for screening mammogram for malignant neoplasm of breast: Secondary | ICD-10-CM | POA: Insufficient documentation

## 2016-11-21 DIAGNOSIS — M1732 Unilateral post-traumatic osteoarthritis, left knee: Secondary | ICD-10-CM | POA: Diagnosis not present

## 2016-11-28 DIAGNOSIS — M1732 Unilateral post-traumatic osteoarthritis, left knee: Secondary | ICD-10-CM | POA: Diagnosis not present

## 2016-11-28 DIAGNOSIS — E10649 Type 1 diabetes mellitus with hypoglycemia without coma: Secondary | ICD-10-CM | POA: Diagnosis not present

## 2016-12-01 ENCOUNTER — Ambulatory Visit (INDEPENDENT_AMBULATORY_CARE_PROVIDER_SITE_OTHER): Payer: PPO | Admitting: Podiatry

## 2016-12-01 ENCOUNTER — Ambulatory Visit (INDEPENDENT_AMBULATORY_CARE_PROVIDER_SITE_OTHER): Payer: PPO

## 2016-12-01 DIAGNOSIS — M7661 Achilles tendinitis, right leg: Secondary | ICD-10-CM | POA: Diagnosis not present

## 2016-12-01 DIAGNOSIS — M79671 Pain in right foot: Secondary | ICD-10-CM

## 2016-12-01 NOTE — Patient Instructions (Signed)

## 2016-12-02 NOTE — Progress Notes (Signed)
She presents today with chief complaint of pain to the posterior aspect of the heel near the Achilles area as she forces the inferior posterior aspect of the right heel. Stasis been this way for the past 2 months she says she can hold pressure on the heel she's tried Aleve and stretching which really doesn't seem to be helping at all.  Objective: I have reviewed her past medical history medications and allergies. Pulses remain palpable. No acute distress. She has good full range of motion with no pain. She has pain on direct palpation of the Achilles at its distalmost aspect of insertion posterior aspect of the right heel. Radiographs taken in the office today 3 views of the foot demonstrate no major osseous abnormalities of the forefoot. Posterior aspect of the left heel does demonstrate thickening of the Achilles tendon as it inserts on a relatively large posterior proximally oriented calcaneal heel spur. No fracture of the spur is noted.  Assessment: Achilles tendinitis retrocalcaneal heel spur.  Plan: I injected the area today with 2 mg of dexamethasone subcutaneously did not inject into the tendon. I also placed her in a night splint. Recommended dressing exercises which are distributed to her today and ice therapy. Follow up with her in 1 month

## 2016-12-05 DIAGNOSIS — M1732 Unilateral post-traumatic osteoarthritis, left knee: Secondary | ICD-10-CM | POA: Diagnosis not present

## 2016-12-05 DIAGNOSIS — M94262 Chondromalacia, left knee: Secondary | ICD-10-CM | POA: Diagnosis not present

## 2016-12-11 DIAGNOSIS — E10649 Type 1 diabetes mellitus with hypoglycemia without coma: Secondary | ICD-10-CM | POA: Diagnosis not present

## 2016-12-11 DIAGNOSIS — E785 Hyperlipidemia, unspecified: Secondary | ICD-10-CM | POA: Diagnosis not present

## 2016-12-11 DIAGNOSIS — E039 Hypothyroidism, unspecified: Secondary | ICD-10-CM | POA: Diagnosis not present

## 2016-12-11 DIAGNOSIS — E538 Deficiency of other specified B group vitamins: Secondary | ICD-10-CM | POA: Diagnosis not present

## 2016-12-19 DIAGNOSIS — E785 Hyperlipidemia, unspecified: Secondary | ICD-10-CM | POA: Diagnosis not present

## 2016-12-19 DIAGNOSIS — Z4681 Encounter for fitting and adjustment of insulin pump: Secondary | ICD-10-CM | POA: Diagnosis not present

## 2016-12-19 DIAGNOSIS — E039 Hypothyroidism, unspecified: Secondary | ICD-10-CM | POA: Diagnosis not present

## 2016-12-19 DIAGNOSIS — E10649 Type 1 diabetes mellitus with hypoglycemia without coma: Secondary | ICD-10-CM | POA: Diagnosis not present

## 2016-12-19 DIAGNOSIS — E538 Deficiency of other specified B group vitamins: Secondary | ICD-10-CM | POA: Diagnosis not present

## 2016-12-29 DIAGNOSIS — E10649 Type 1 diabetes mellitus with hypoglycemia without coma: Secondary | ICD-10-CM | POA: Diagnosis not present

## 2016-12-31 ENCOUNTER — Ambulatory Visit (INDEPENDENT_AMBULATORY_CARE_PROVIDER_SITE_OTHER): Payer: PPO | Admitting: Podiatry

## 2016-12-31 DIAGNOSIS — M7661 Achilles tendinitis, right leg: Secondary | ICD-10-CM

## 2016-12-31 NOTE — Progress Notes (Signed)
She presents today for follow-up of her Achilles tendinitis to the right leg. She states it is really not any better and I been doing everything that I can possibly do.  Objective: Vital signs are stable she is alert and oriented 3 failure of conservative therapies have failed and she still has pain on palpation posterior calcaneal tubercle  Assessment: Painful insertional Achilles tendinitis right.  Plan: Requesting MRI of the right foot. This is for surgical consideration.

## 2017-01-01 ENCOUNTER — Telehealth: Payer: Self-pay | Admitting: *Deleted

## 2017-01-01 NOTE — Telephone Encounter (Addendum)
-----   Message from Mack Hook, LPN sent at 075-GRM  2:08 PM EST ----- Dr. Milinda Pointer requests MRI right achilles   Dx insertional achilles tendonitis  For surgical consideration.  Order is in [place]. 01/01/2017-Orders faxed to [facility] and A. Clifton James, LPN for pre-cert. 075-GRM pt of the delay for overread and explained overread process. 02/09/2017-Mailed copy of MRI disc to [facility].

## 2017-01-15 ENCOUNTER — Telehealth: Payer: Self-pay | Admitting: *Deleted

## 2017-01-15 NOTE — Telephone Encounter (Signed)
"  She's coming in for a MRI of the foot without contrast.  It needs to be pre-certed with [employer].  I saw an authorization number in there but it's MRI of the ankle.  MRI of the foot is a different code.  Give me a call back."

## 2017-01-16 ENCOUNTER — Ambulatory Visit: Admission: RE | Admit: 2017-01-16 | Payer: PPO | Source: Ambulatory Visit

## 2017-01-19 NOTE — Telephone Encounter (Signed)
Dr. Milinda Pointer said that the diagnoses is insertional achilles tendonitis and requested MRI of foot.  That is what the order was for and the authorization was approved for.  Do I need to do one for the ankle?

## 2017-01-20 ENCOUNTER — Telehealth: Payer: Self-pay | Admitting: *Deleted

## 2017-01-20 NOTE — Telephone Encounter (Signed)
"  Patient is scheduled for MRI of right foot without contrast on 01/22/2017.  She needs authorization from [employer] which is the website for [facility].  She has [employer]."  Okay, we'll take care of it.

## 2017-01-20 NOTE — Telephone Encounter (Addendum)
I think she was saying the ankle was authorized. The wrong code may have been given previously.  It should have been for 73718 for foot.  Is this the code you used?  Ankle cpt is 73721.  Call and see if you can switch it to foot instead of ankle.

## 2017-01-22 ENCOUNTER — Ambulatory Visit
Admission: RE | Admit: 2017-01-22 | Discharge: 2017-01-22 | Disposition: A | Discharge status: Home / Self Care | Payer: PPO | Source: Ambulatory Visit | Attending: Podiatry | Admitting: Podiatry

## 2017-01-22 DIAGNOSIS — M7661 Achilles tendinitis, right leg: Secondary | ICD-10-CM | POA: Insufficient documentation

## 2017-01-22 NOTE — Telephone Encounter (Signed)
Authorization has been approved  Auth # [id number] for cpt [id number].  [facility] radiology was notified of new PA #.

## 2017-01-26 DIAGNOSIS — E10649 Type 1 diabetes mellitus with hypoglycemia without coma: Secondary | ICD-10-CM | POA: Diagnosis not present

## 2017-01-30 NOTE — Telephone Encounter (Signed)
-----   Message from Garrel Ridgel, [place] sent at 01/24/2017 10:19 AM EST ----- Send for an over read and inform patient of the delay

## 2017-02-09 ENCOUNTER — Telehealth: Payer: Self-pay | Admitting: Podiatry

## 2017-02-09 NOTE — Telephone Encounter (Signed)
Patient called the office stating she had her MRI at [facility] on 01/22/17 and has not heard anything from [facility]. Would like an update and details of the findings on the MRI. Please call her at home number to discuss.

## 2017-02-09 NOTE — Telephone Encounter (Signed)
I informed pt, I had spoken to her on 01/30/2017 and informed of the delay and had received the MRI disc from [facility] today and mailed to the overread service today.

## 2017-02-17 ENCOUNTER — Encounter: Payer: Self-pay | Admitting: Podiatry

## 2017-02-25 ENCOUNTER — Encounter: Payer: Self-pay | Admitting: Podiatry

## 2017-02-25 ENCOUNTER — Ambulatory Visit (INDEPENDENT_AMBULATORY_CARE_PROVIDER_SITE_OTHER): Payer: PPO | Admitting: Podiatry

## 2017-02-25 DIAGNOSIS — M7661 Achilles tendinitis, right leg: Secondary | ICD-10-CM

## 2017-02-25 NOTE — Progress Notes (Signed)
She presents today for follow-up of her Achilles tendinitis right heel. She states that it still bothers me but I been wearing open heeled shoes. She is here for MRI interpretation.  Objective: Pulses are palpable no change in pain on palpation to the right Achilles at its insertion. It is warm to the touch.  MRI does demonstrate Achilles tendinopathy without a tear with a retrocalcaneal heel spur and osseous edema.  Assessment: Achilles tendinopathy right.  Plan: Provide her with surgical versus physical therapy options. She chose physical therapy at this point fully understanding that should this fail surgery will be the only option. She understands this is amenable to it and will follow up with me in 4-5 weeks.

## 2017-02-26 DIAGNOSIS — E10649 Type 1 diabetes mellitus with hypoglycemia without coma: Secondary | ICD-10-CM | POA: Diagnosis not present

## 2017-03-03 DIAGNOSIS — M25571 Pain in right ankle and joints of right foot: Secondary | ICD-10-CM | POA: Diagnosis not present

## 2017-03-06 DIAGNOSIS — M25571 Pain in right ankle and joints of right foot: Secondary | ICD-10-CM | POA: Diagnosis not present

## 2017-03-09 DIAGNOSIS — M25571 Pain in right ankle and joints of right foot: Secondary | ICD-10-CM | POA: Diagnosis not present

## 2017-03-13 DIAGNOSIS — M25571 Pain in right ankle and joints of right foot: Secondary | ICD-10-CM | POA: Diagnosis not present

## 2017-03-17 DIAGNOSIS — M25571 Pain in right ankle and joints of right foot: Secondary | ICD-10-CM | POA: Diagnosis not present

## 2017-03-19 DIAGNOSIS — M25571 Pain in right ankle and joints of right foot: Secondary | ICD-10-CM | POA: Diagnosis not present

## 2017-03-24 DIAGNOSIS — E109 Type 1 diabetes mellitus without complications: Secondary | ICD-10-CM | POA: Diagnosis not present

## 2017-03-24 DIAGNOSIS — E10649 Type 1 diabetes mellitus with hypoglycemia without coma: Secondary | ICD-10-CM | POA: Diagnosis not present

## 2017-03-28 DIAGNOSIS — E10649 Type 1 diabetes mellitus with hypoglycemia without coma: Secondary | ICD-10-CM | POA: Diagnosis not present

## 2017-03-31 DIAGNOSIS — E875 Hyperkalemia: Secondary | ICD-10-CM | POA: Diagnosis not present

## 2017-03-31 DIAGNOSIS — E034 Atrophy of thyroid (acquired): Secondary | ICD-10-CM | POA: Diagnosis not present

## 2017-03-31 DIAGNOSIS — E109 Type 1 diabetes mellitus without complications: Secondary | ICD-10-CM | POA: Diagnosis not present

## 2017-04-01 ENCOUNTER — Ambulatory Visit: Payer: PPO | Admitting: Podiatry

## 2017-04-21 DIAGNOSIS — R5383 Other fatigue: Secondary | ICD-10-CM | POA: Diagnosis not present

## 2017-04-28 DIAGNOSIS — E10649 Type 1 diabetes mellitus with hypoglycemia without coma: Secondary | ICD-10-CM | POA: Diagnosis not present

## 2017-05-22 DIAGNOSIS — H43813 Vitreous degeneration, bilateral: Secondary | ICD-10-CM | POA: Diagnosis not present

## 2017-05-28 DIAGNOSIS — E10649 Type 1 diabetes mellitus with hypoglycemia without coma: Secondary | ICD-10-CM | POA: Diagnosis not present

## 2017-06-08 ENCOUNTER — Encounter: Payer: Self-pay | Admitting: Podiatry

## 2017-06-08 ENCOUNTER — Ambulatory Visit (INDEPENDENT_AMBULATORY_CARE_PROVIDER_SITE_OTHER): Payer: PPO | Admitting: Podiatry

## 2017-06-08 DIAGNOSIS — M7661 Achilles tendinitis, right leg: Secondary | ICD-10-CM | POA: Diagnosis not present

## 2017-06-08 NOTE — Progress Notes (Signed)
She presents today to discuss surgical intervention regarding her right heel. She states the physical therapy did not help at all. She states, have to have something done about this second hardly touch it with shoe gear or even in the bed. We reviewed her past medical history medications and allergies together. Diabetes and underactive thyroid disease or her primary concerns. She utilizes insulin pump is treated by Dr. Eddie Dibbles and her primary physician is Dr. Lisette Grinder.  Objective: Vital signs are stable alert and oriented 3. Pulses are palpable. Neurologic sensorium is intact. Large nonpulsatile mass posterior aspect of the right heel with erythema and pain on insertion of the Achilles obviously Achilles tendinitis insertional in nature for MRI.  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  Plan: We signed a consent form today for a gastroc recession retrocalcaneal heel spur resection Achilles tendon lysis and application of a cast on the right foot. We discussed the possible postoperative patient's which may include or not limited to postop pain bleeding swelling infection recurrence and need for further surgery overcorrection under correction. She was provided informational for the surgery center as well as the anesthesia group. I will follow-up with her in the near future for surgical intervention we are requesting medical clearance.

## 2017-06-08 NOTE — Patient Instructions (Signed)
Pre-Operative Instructions  Congratulations, you have decided to take an important step to improving your quality of life.  You can be assured that the doctors of [facility] will be with you every step of the way.  1. Plan to be at the surgery center/hospital at least 1 (one) hour prior to your scheduled time unless otherwise directed by the surgical center/hospital staff.  You must have a responsible adult accompany you, remain during the surgery and drive you home.  Make sure you have directions to the surgical center/hospital and know how to get there on time. 2. For hospital based surgery you will need to obtain a history and physical form from your family physician within 1 month prior to the date of surgery- we will give you a form for you primary physician.  3. We make every effort to accommodate the date you request for surgery.  There are however, times where surgery dates or times have to be moved.  We will contact you as soon as possible if a change in schedule is required.   4. No Aspirin/Ibuprofen for one week before surgery.  If you are on aspirin, any non-steroidal anti-inflammatory medications (Mobic, Aleve, Ibuprofen) you should stop taking it 7 days prior to your surgery.  You make take Tylenol  For pain prior to surgery.  5. Medications- If you are taking daily heart and blood pressure medications, seizure, reflux, allergy, asthma, anxiety, pain or diabetes medications, make sure the surgery center/hospital is aware before the day of surgery so they may notify you which medications to take or avoid the day of surgery. 6. No food or drink after midnight the night before surgery unless directed otherwise by surgical center/hospital staff. 7. No alcoholic beverages 24 hours prior to surgery.  No smoking 24 hours prior to or 24 hours after surgery. 8. Wear loose pants or shorts- loose enough to fit over bandages, boots, and casts. 9. No slip on shoes, sneakers are best. 10. Bring  your boot with you to the surgery center/hospital.  Also bring crutches or a walker if your physician has prescribed it for you.  If you do not have this equipment, it will be provided for you after surgery. 11. If you have not been contracted by the surgery center/hospital by the day before your surgery, call to confirm the date and time of your surgery. 12. Leave-time from work may vary depending on the type of surgery you have.  Appropriate arrangements should be made prior to surgery with your employer. 13. Prescriptions will be provided immediately following surgery by your doctor.  Have these filled as soon as possible after surgery and take the medication as directed. 14. Remove nail polish on the operative foot. 15. Wash the night before surgery.  The night before surgery wash the foot and leg well with the antibacterial soap provided and water paying special attention to beneath the toenails and in between the toes.  Rinse thoroughly with water and dry well with a towel.  Perform this wash unless told not to do so by your physician.  Enclosed: 1 Ice pack (please put in freezer the night before surgery)   1 Hibiclens skin cleaner   Pre-op Instructions  If you have any questions regarding the instructions, do not hesitate to call our office.  [place]: [address] [place], [place] [address] [phone]  [place]: [address] [place], [place] [address] [phone]  [place]: [address]  [place], [place] [address] [phone]   Dr.   Norman Regal DPM, Dr. Matthew Wagoner DPM, Dr. M. Todd Hyatt DPM, Dr. Titorya Stover DPM 

## 2017-06-10 ENCOUNTER — Telehealth: Payer: Self-pay | Admitting: *Deleted

## 2017-06-10 NOTE — Telephone Encounter (Signed)
"  Good afternoon.  I'm a patient of Dr. Stephenie Acres in [place].  I'm calling to see what kind of schedule you have for surgery coming up."

## 2017-06-10 NOTE — Telephone Encounter (Signed)
I'm returning your call.  Do you have a date in mind that you would like to schedule surgery?  "How about the week of August 6?"  He can't do it that week.  He wants to get medical clearance.  "Okay, whatever he has available."  He can do it on August 24.  "That will be fine.  You'll call me with the time?"  Someone from the surgical center will give you a call a day or two prior to surgery date.

## 2017-06-11 ENCOUNTER — Telehealth: Payer: Self-pay | Admitting: *Deleted

## 2017-06-11 ENCOUNTER — Encounter: Payer: Self-pay | Admitting: *Deleted

## 2017-06-11 DIAGNOSIS — E10649 Type 1 diabetes mellitus with hypoglycemia without coma: Secondary | ICD-10-CM | POA: Diagnosis not present

## 2017-06-11 DIAGNOSIS — E039 Hypothyroidism, unspecified: Secondary | ICD-10-CM | POA: Diagnosis not present

## 2017-06-11 NOTE — Telephone Encounter (Signed)
"  I'm returning you call from this morning."

## 2017-06-11 NOTE — Telephone Encounter (Signed)
I'm returning your call.  How can I help you?  "I thought maybe you had called.  Someone called from your office number.  I figured it was you."  No, I haven't called.  "Okay thank you, sorry to be a bother."

## 2017-06-15 ENCOUNTER — Telehealth: Payer: Self-pay | Admitting: *Deleted

## 2017-06-15 ENCOUNTER — Encounter: Payer: Self-pay | Admitting: *Deleted

## 2017-06-15 NOTE — Telephone Encounter (Signed)
I asked pt the name of her endocrinologist, pt states Dr. Adella Hare [phone]. Faxed medical clearance letter to Dr. Adella Hare.

## 2017-06-18 DIAGNOSIS — E10649 Type 1 diabetes mellitus with hypoglycemia without coma: Secondary | ICD-10-CM | POA: Diagnosis not present

## 2017-06-18 DIAGNOSIS — E538 Deficiency of other specified B group vitamins: Secondary | ICD-10-CM | POA: Diagnosis not present

## 2017-06-18 DIAGNOSIS — Z4681 Encounter for fitting and adjustment of insulin pump: Secondary | ICD-10-CM | POA: Diagnosis not present

## 2017-06-18 DIAGNOSIS — E785 Hyperlipidemia, unspecified: Secondary | ICD-10-CM | POA: Diagnosis not present

## 2017-06-18 DIAGNOSIS — E039 Hypothyroidism, unspecified: Secondary | ICD-10-CM | POA: Diagnosis not present

## 2017-06-19 DIAGNOSIS — E10649 Type 1 diabetes mellitus with hypoglycemia without coma: Secondary | ICD-10-CM | POA: Diagnosis not present

## 2017-06-28 DIAGNOSIS — E10649 Type 1 diabetes mellitus with hypoglycemia without coma: Secondary | ICD-10-CM | POA: Diagnosis not present

## 2017-06-30 DIAGNOSIS — D2271 Melanocytic nevi of right lower limb, including hip: Secondary | ICD-10-CM | POA: Diagnosis not present

## 2017-06-30 DIAGNOSIS — D2261 Melanocytic nevi of right upper limb, including shoulder: Secondary | ICD-10-CM | POA: Diagnosis not present

## 2017-06-30 DIAGNOSIS — L821 Other seborrheic keratosis: Secondary | ICD-10-CM | POA: Diagnosis not present

## 2017-06-30 DIAGNOSIS — D2262 Melanocytic nevi of left upper limb, including shoulder: Secondary | ICD-10-CM | POA: Diagnosis not present

## 2017-07-02 ENCOUNTER — Telehealth: Payer: Self-pay | Admitting: *Deleted

## 2017-07-02 NOTE — Telephone Encounter (Signed)
Per Dr. Milinda Pointer, I'm calling to let you know that he received medical clearance from Dr. Lisette Grinder.  However, Dr. Gilford Rile has suggested for you to contact Dr. Eddie Dibbles about the insulin pump management.  "I saw Dr. Eddie Dibbles yesterday and she said I didn't need to do anything.  She said I was healthy as a horse and that I shouldn't have any problems with having the surgery.

## 2017-07-07 ENCOUNTER — Other Ambulatory Visit: Payer: Self-pay | Admitting: Podiatry

## 2017-07-07 ENCOUNTER — Telehealth: Payer: Self-pay | Admitting: *Deleted

## 2017-07-07 MED ORDER — OXYCODONE-ACETAMINOPHEN 10-325 MG PO TABS: 1.0000 | ORAL_TABLET | ORAL | 0 refills | Status: DC | PRN

## 2017-07-07 MED ORDER — PROMETHAZINE HCL 25 MG PO TABS: 25.0000 mg | ORAL_TABLET | Freq: Three times a day (TID) | ORAL | 0 refills | Status: DC | PRN

## 2017-07-07 MED ORDER — DOXYCYCLINE HYCLATE 100 MG PO TABS: 100.0000 mg | ORAL_TABLET | Freq: Two times a day (BID) | ORAL | 0 refills | Status: DC

## 2017-07-07 NOTE — Telephone Encounter (Signed)
I called to check on the status of authorization for surgery scheduled for 07/10/2017.  I submitted the request on 06/26/2017.  I was informed that it was still pending.  I asked if anything was needed to speed the process up.  She stated they had everything that was needed.  They will call if any additional information is needed.

## 2017-07-09 NOTE — Telephone Encounter (Signed)
I called and spoke to [facility] regarding authorization request.  She stated the surgery was approved from 06/26/2017 to 09/25/2017.  The authorization number is [id number].

## 2017-07-10 ENCOUNTER — Encounter: Payer: Self-pay | Admitting: Podiatry

## 2017-07-10 DIAGNOSIS — M7731 Calcaneal spur, right foot: Secondary | ICD-10-CM | POA: Diagnosis not present

## 2017-07-10 DIAGNOSIS — M25571 Pain in right ankle and joints of right foot: Secondary | ICD-10-CM | POA: Diagnosis not present

## 2017-07-10 DIAGNOSIS — M62461 Contracture of muscle, right lower leg: Secondary | ICD-10-CM | POA: Diagnosis not present

## 2017-07-10 DIAGNOSIS — M7661 Achilles tendinitis, right leg: Secondary | ICD-10-CM | POA: Diagnosis not present

## 2017-07-10 DIAGNOSIS — E7800 Pure hypercholesterolemia, unspecified: Secondary | ICD-10-CM | POA: Diagnosis not present

## 2017-07-10 DIAGNOSIS — M216X1 Other acquired deformities of right foot: Secondary | ICD-10-CM | POA: Diagnosis not present

## 2017-07-10 DIAGNOSIS — M65871 Other synovitis and tenosynovitis, right ankle and foot: Secondary | ICD-10-CM | POA: Diagnosis not present

## 2017-07-15 ENCOUNTER — Ambulatory Visit (INDEPENDENT_AMBULATORY_CARE_PROVIDER_SITE_OTHER): Payer: PPO

## 2017-07-15 ENCOUNTER — Ambulatory Visit (INDEPENDENT_AMBULATORY_CARE_PROVIDER_SITE_OTHER): Payer: Self-pay | Admitting: Podiatry

## 2017-07-15 VITALS — BP 168/87 | HR 79 | Temp 97.7°F | Resp 16

## 2017-07-15 DIAGNOSIS — M7661 Achilles tendinitis, right leg: Secondary | ICD-10-CM | POA: Diagnosis not present

## 2017-07-15 NOTE — Progress Notes (Signed)
She presents today 1 week postop gastroc recession retrocalcaneal heel spur resection Achilles tendon lysis and cast application. She states that she is having a hard time keeping her blood sugars under control as well as her blood pressure. She denies fever chills nausea vomiting muscle aches and pains. Denies chest pain shortness of breath.  Objective: Vital signs are stable she is alert and oriented 3. She has good circulation to her toes are nice and pink with good capillary fill time the cast is not tight around the proximal portion of the leg and she has good sensation to her toes. Radiographs taken today demonstrate retrocalcaneal heel spur has been resected staples are in place there's pulling of cast padding.  Assessment one week status post heel spur resection date of surgery 07/10/2017. Healing well.  Plan: Follow up with me in 1 week for cast removal and reapplication. He

## 2017-07-24 ENCOUNTER — Encounter: Payer: Self-pay | Admitting: Podiatry

## 2017-07-24 ENCOUNTER — Ambulatory Visit (INDEPENDENT_AMBULATORY_CARE_PROVIDER_SITE_OTHER): Payer: PPO | Admitting: Podiatry

## 2017-07-24 DIAGNOSIS — Z9889 Other specified postprocedural states: Secondary | ICD-10-CM

## 2017-07-24 NOTE — Progress Notes (Signed)
   Subjective:  Patient presents today status post heel spur resection with gastroc lengthening of the right lower extremity. DOS: 07/10/2017. Patient states that she is doing very well. No new complaints today. Patient states that her pain is improving significantly.   Past Medical History:  Diagnosis Date  . Anemia   . Arthritis   . Asthma   . Diabetes mellitus without complication ([place])   . GERD (gastroesophageal reflux disease)    H/O  . Heart murmur   . Hx of cervical spine surgery   . Hyperlipidemia   . Hypothyroidism   . Insulin pump in place   . PONV (postoperative nausea and vomiting)       Objective/Physical Exam Skin incisions appear to be well coapted with sutures and staples intact. No sign of infectious process noted. No dehiscence. No active bleeding noted. Moderate edema noted to the surgical extremity.  Assessment: 1. s/p posterior heel spur resection with gasctroc lengthening right lower extremity. DOS: 07/10/2017   Plan of Care:  1. Patient was evaluated. 2. Cast was removed today. Staples removed.  3. Steristrips and dry sterile dressings applied.  4. Fiberglass cast applied to surgical extremity. Continue NWB to the right lower extremity.  5. Return to clinic in 2 weeks with Dr. Milinda Pointer.    Edrick Kins, DPM [facility]  Dr. Edrick Kins, [address]                                        [place], [place] [address]                Office [phone]  Fax [phone]

## 2017-07-29 DIAGNOSIS — E10649 Type 1 diabetes mellitus with hypoglycemia without coma: Secondary | ICD-10-CM | POA: Diagnosis not present

## 2017-08-10 ENCOUNTER — Encounter: Payer: Self-pay | Admitting: Podiatry

## 2017-08-10 ENCOUNTER — Ambulatory Visit (INDEPENDENT_AMBULATORY_CARE_PROVIDER_SITE_OTHER): Payer: PPO | Admitting: Podiatry

## 2017-08-10 ENCOUNTER — Ambulatory Visit (INDEPENDENT_AMBULATORY_CARE_PROVIDER_SITE_OTHER): Payer: PPO

## 2017-08-10 DIAGNOSIS — M7661 Achilles tendinitis, right leg: Secondary | ICD-10-CM

## 2017-08-10 NOTE — Progress Notes (Signed)
She presents today for follow-up of her gastroc recession and Achilles tendon repair right heel. She states that she's been doing just fine no chest pain no Pain or shortness of breath. Continues to utilize her knee scooter.  Objective: Vital signs are stable alert and oriented 3. Cast to the right lower extremity was intact and clean. Once removed demonstrate dry sterile dressing intact no erythema edema cellulitis drainage or odor. Distalmost aspect of the incision appears to be a little bit soft but is not draining. No signs of infection.  Assessment: Well-healing surgical leg and foot.  Plan: I'm going allow her to start partial weightbearing with the Cam Walker and she will use her night splint at bedtime. I will follow-up with her in 2 weeks at which time I would like for her to be fully standing and ambulating with her Cam Walker.

## 2017-08-24 ENCOUNTER — Encounter: Payer: Self-pay | Admitting: Podiatry

## 2017-08-24 ENCOUNTER — Ambulatory Visit (INDEPENDENT_AMBULATORY_CARE_PROVIDER_SITE_OTHER): Payer: PPO | Admitting: Podiatry

## 2017-08-24 ENCOUNTER — Ambulatory Visit (INDEPENDENT_AMBULATORY_CARE_PROVIDER_SITE_OTHER): Payer: PPO

## 2017-08-24 DIAGNOSIS — M7661 Achilles tendinitis, right leg: Secondary | ICD-10-CM

## 2017-08-24 NOTE — Progress Notes (Signed)
She presents today for follow-up of her gastroc recession Achilles tendon lysis retrocalcaneal heel spur resection date of surgery 07/10/2017 on the right foot. She states is feeling pretty good and is feeling okay.  Objective: Vital signs are stable she is alert and oriented 3 she has no calf pain. Still small scab to the distalmost aspect of the incision which is tender and sensitive. No signs of infection no edema erythema cellulitis drainage or odor.  Assessment: Well-healing surgical foot.  Plan: Encouraged her to try to walk without the crutches just using her Cam Walker and anti-inflammatories as needed. We'll follow-up with her in 2-3 weeks.

## 2017-08-25 NOTE — Progress Notes (Signed)
DOS 07/10/17 gastroc recession Rt, retrocalcaneal heel spur resect, achilles tenolysis, w cast Rt

## 2017-08-26 DIAGNOSIS — M1732 Unilateral post-traumatic osteoarthritis, left knee: Secondary | ICD-10-CM | POA: Diagnosis not present

## 2017-08-26 DIAGNOSIS — M25562 Pain in left knee: Secondary | ICD-10-CM | POA: Diagnosis not present

## 2017-08-28 DIAGNOSIS — E10649 Type 1 diabetes mellitus with hypoglycemia without coma: Secondary | ICD-10-CM | POA: Diagnosis not present

## 2017-09-14 ENCOUNTER — Ambulatory Visit (INDEPENDENT_AMBULATORY_CARE_PROVIDER_SITE_OTHER): Payer: PPO | Admitting: Podiatry

## 2017-09-14 ENCOUNTER — Encounter: Payer: Self-pay | Admitting: Podiatry

## 2017-09-14 DIAGNOSIS — M7661 Achilles tendinitis, right leg: Secondary | ICD-10-CM

## 2017-09-14 NOTE — Progress Notes (Signed)
She presents today for postop visit she is now status post 2 months gastroc recession Achilles tendon lysis retro-calcaneal spur resection and EPF on the right foot. She states it still swells and is painful sometimes but for the most part it feels a whole lot better than before surgery.  Objective: Vital signs are stable she's alert and oriented 3. Pulses are palpable. Incision line is going to heal uneventfully minimal edema no erythema cellulitis drainage or odor. Full plantarflexion against resistance.  Assessment: Well-healing surgical foot and leg.  Plan: Recommended she follow up with me in one more month. I will allow her to get back to her water aerobics and exercise regimen. Follow-up with me in 1 month or as needed.

## 2017-09-16 DIAGNOSIS — M1732 Unilateral post-traumatic osteoarthritis, left knee: Secondary | ICD-10-CM | POA: Diagnosis not present

## 2017-09-17 DIAGNOSIS — M1732 Unilateral post-traumatic osteoarthritis, left knee: Secondary | ICD-10-CM | POA: Diagnosis not present

## 2017-09-21 DIAGNOSIS — E10649 Type 1 diabetes mellitus with hypoglycemia without coma: Secondary | ICD-10-CM | POA: Diagnosis not present

## 2017-09-24 DIAGNOSIS — M1732 Unilateral post-traumatic osteoarthritis, left knee: Secondary | ICD-10-CM | POA: Diagnosis not present

## 2017-09-25 DIAGNOSIS — E109 Type 1 diabetes mellitus without complications: Secondary | ICD-10-CM | POA: Diagnosis not present

## 2017-09-28 DIAGNOSIS — E10649 Type 1 diabetes mellitus with hypoglycemia without coma: Secondary | ICD-10-CM | POA: Diagnosis not present

## 2017-10-01 DIAGNOSIS — M1732 Unilateral post-traumatic osteoarthritis, left knee: Secondary | ICD-10-CM | POA: Diagnosis not present

## 2017-10-02 DIAGNOSIS — E119 Type 2 diabetes mellitus without complications: Secondary | ICD-10-CM | POA: Diagnosis not present

## 2017-10-02 DIAGNOSIS — E538 Deficiency of other specified B group vitamins: Secondary | ICD-10-CM | POA: Diagnosis not present

## 2017-10-02 DIAGNOSIS — Z5181 Encounter for therapeutic drug level monitoring: Secondary | ICD-10-CM | POA: Diagnosis not present

## 2017-10-02 DIAGNOSIS — E7800 Pure hypercholesterolemia, unspecified: Secondary | ICD-10-CM | POA: Diagnosis not present

## 2017-10-02 DIAGNOSIS — Z794 Long term (current) use of insulin: Secondary | ICD-10-CM | POA: Diagnosis not present

## 2017-10-02 DIAGNOSIS — Z0000 Encounter for general adult medical examination without abnormal findings: Secondary | ICD-10-CM | POA: Diagnosis not present

## 2017-10-02 DIAGNOSIS — E034 Atrophy of thyroid (acquired): Secondary | ICD-10-CM | POA: Diagnosis not present

## 2017-10-05 ENCOUNTER — Other Ambulatory Visit: Payer: Self-pay | Admitting: Internal Medicine

## 2017-10-05 DIAGNOSIS — Z1231 Encounter for screening mammogram for malignant neoplasm of breast: Secondary | ICD-10-CM

## 2017-10-12 ENCOUNTER — Ambulatory Visit: Payer: PPO | Admitting: Podiatry

## 2017-10-28 DIAGNOSIS — E10649 Type 1 diabetes mellitus with hypoglycemia without coma: Secondary | ICD-10-CM | POA: Diagnosis not present

## 2017-11-16 ENCOUNTER — Ambulatory Visit
Admission: RE | Admit: 2017-11-16 | Discharge: 2017-11-16 | Disposition: A | Discharge status: Home / Self Care | Payer: PPO | Source: Ambulatory Visit | Attending: Internal Medicine | Admitting: Internal Medicine

## 2017-11-16 DIAGNOSIS — Z1231 Encounter for screening mammogram for malignant neoplasm of breast: Secondary | ICD-10-CM | POA: Insufficient documentation

## 2017-11-17 DIAGNOSIS — E119 Type 2 diabetes mellitus without complications: Secondary | ICD-10-CM

## 2017-11-17 DIAGNOSIS — E10649 Type 1 diabetes mellitus with hypoglycemia without coma: Secondary | ICD-10-CM | POA: Diagnosis not present

## 2017-11-17 HISTORY — DX: Type 2 diabetes mellitus without complications: E11.9

## 2017-12-08 DIAGNOSIS — M1712 Unilateral primary osteoarthritis, left knee: Secondary | ICD-10-CM | POA: Diagnosis not present

## 2017-12-13 DIAGNOSIS — M1712 Unilateral primary osteoarthritis, left knee: Secondary | ICD-10-CM | POA: Insufficient documentation

## 2017-12-22 DIAGNOSIS — E10649 Type 1 diabetes mellitus with hypoglycemia without coma: Secondary | ICD-10-CM | POA: Diagnosis not present

## 2017-12-25 DIAGNOSIS — Z4681 Encounter for fitting and adjustment of insulin pump: Secondary | ICD-10-CM | POA: Diagnosis not present

## 2017-12-25 DIAGNOSIS — E039 Hypothyroidism, unspecified: Secondary | ICD-10-CM | POA: Diagnosis not present

## 2017-12-25 DIAGNOSIS — E10649 Type 1 diabetes mellitus with hypoglycemia without coma: Secondary | ICD-10-CM | POA: Diagnosis not present

## 2017-12-25 DIAGNOSIS — E785 Hyperlipidemia, unspecified: Secondary | ICD-10-CM | POA: Diagnosis not present

## 2017-12-25 DIAGNOSIS — E538 Deficiency of other specified B group vitamins: Secondary | ICD-10-CM | POA: Diagnosis not present

## 2018-01-19 DIAGNOSIS — M25562 Pain in left knee: Secondary | ICD-10-CM | POA: Diagnosis not present

## 2018-01-20 ENCOUNTER — Other Ambulatory Visit: Payer: Self-pay

## 2018-01-20 ENCOUNTER — Encounter
Admission: RE | Admit: 2018-01-20 | Discharge: 2018-01-20 | Disposition: A | Discharge status: Home / Self Care | Payer: PPO | Source: Ambulatory Visit | Attending: Orthopedic Surgery | Admitting: Orthopedic Surgery

## 2018-01-20 DIAGNOSIS — E1122 Type 2 diabetes mellitus with diabetic chronic kidney disease: Secondary | ICD-10-CM | POA: Insufficient documentation

## 2018-01-20 DIAGNOSIS — E785 Hyperlipidemia, unspecified: Secondary | ICD-10-CM | POA: Insufficient documentation

## 2018-01-20 DIAGNOSIS — Z01810 Encounter for preprocedural cardiovascular examination: Secondary | ICD-10-CM | POA: Diagnosis not present

## 2018-01-20 DIAGNOSIS — N189 Chronic kidney disease, unspecified: Secondary | ICD-10-CM | POA: Diagnosis not present

## 2018-01-20 DIAGNOSIS — Z01812 Encounter for preprocedural laboratory examination: Secondary | ICD-10-CM | POA: Insufficient documentation

## 2018-01-20 DIAGNOSIS — D649 Anemia, unspecified: Secondary | ICD-10-CM | POA: Diagnosis not present

## 2018-01-20 DIAGNOSIS — E119 Type 2 diabetes mellitus without complications: Secondary | ICD-10-CM | POA: Diagnosis not present

## 2018-01-20 DIAGNOSIS — K219 Gastro-esophageal reflux disease without esophagitis: Secondary | ICD-10-CM | POA: Insufficient documentation

## 2018-01-20 DIAGNOSIS — E039 Hypothyroidism, unspecified: Secondary | ICD-10-CM | POA: Diagnosis not present

## 2018-01-20 HISTORY — DX: Chronic kidney disease, unspecified: N18.9

## 2018-01-20 LAB — URINALYSIS, ROUTINE W REFLEX MICROSCOPIC
Bilirubin Urine: NEGATIVE
GLUCOSE, UA: NEGATIVE mg/dL
HGB URINE DIPSTICK: NEGATIVE
Ketones, ur: NEGATIVE mg/dL
LEUKOCYTES UA: NEGATIVE
Nitrite: NEGATIVE
Protein, ur: NEGATIVE mg/dL
SPECIFIC GRAVITY, URINE: 1.012 (ref 1.005–1.030)
pH: 6 (ref 5.0–8.0)

## 2018-01-20 LAB — CBC
HCT: 40.8 % (ref 35.0–47.0)
Hemoglobin: 13.7 g/dL (ref 12.0–16.0)
MCH: 30.3 pg (ref 26.0–34.0)
MCHC: 33.6 g/dL (ref 32.0–36.0)
MCV: 90.2 fL (ref 80.0–100.0)
PLATELETS: 230 10*3/uL (ref 150–440)
RBC: 4.52 MIL/uL (ref 3.80–5.20)
RDW: 14 % (ref 11.5–14.5)
WBC: 6.9 10*3/uL (ref 3.6–11.0)

## 2018-01-20 LAB — COMPREHENSIVE METABOLIC PANEL
ALK PHOS: 70 U/L (ref 38–126)
ALT: 17 U/L (ref 14–54)
ANION GAP: 10 (ref 5–15)
AST: 30 U/L (ref 15–41)
Albumin: 4.1 g/dL (ref 3.5–5.0)
BUN: 18 mg/dL (ref 6–20)
CALCIUM: 9.3 mg/dL (ref 8.9–10.3)
CO2: 27 mmol/L (ref 22–32)
CREATININE: 0.97 mg/dL (ref 0.44–1.00)
Chloride: 97 mmol/L — ABNORMAL LOW (ref 101–111)
GFR, EST NON AFRICAN AMERICAN: 55 mL/min — AB (ref >60)
Glucose, Bld: 196 mg/dL — ABNORMAL HIGH (ref 65–99)
Potassium: 4 mmol/L (ref 3.5–5.1)
SODIUM: 134 mmol/L — AB (ref 135–145)
Total Bilirubin: 1.4 mg/dL — ABNORMAL HIGH (ref 0.3–1.2)
Total Protein: 7.5 g/dL (ref 6.5–8.1)

## 2018-01-20 LAB — TYPE AND SCREEN
ABO/RH(D): O NEG
Antibody Screen: NEGATIVE

## 2018-01-20 LAB — SURGICAL PCR SCREEN
MRSA, PCR: NEGATIVE
STAPHYLOCOCCUS AUREUS: NEGATIVE

## 2018-01-20 LAB — SEDIMENTATION RATE: Sed Rate: 29 mm/hr (ref 0–30)

## 2018-01-20 LAB — PROTIME-INR
INR: 1
Prothrombin Time: 13.1 seconds (ref 11.4–15.2)

## 2018-01-20 LAB — C-REACTIVE PROTEIN: CRP: 0.8 mg/dL (ref <1.0)

## 2018-01-20 LAB — APTT: aPTT: 27 seconds (ref 24–36)

## 2018-01-20 NOTE — Pre-Procedure Instructions (Signed)
Patient requests that none of her nonprescription medications be ordered while inpatient.          Telephone call to Dr. Andree Elk (anesthesia) regarding insulin pump and arm sensor.  We were unsure if patient could keep the sensor in place during surgery as it has a 14 day life span. He felt that it could remain intact on day of surgery.  Also, reviewed with patient the usual protocols for an insulin pump but requested her to contact Dr. Eddie Dibbles to review just how low she can turn the pump at midnight on the night before surgery. Patient also informed to drink gatorade or apple juice if she feels her sugar is running low on surgical morning and not to eat her glucose tablets.  She states understanding of all information provided. **Patient does not rely on sensor only for administering her insulin as it can be 30 or 40 points different than her finger stick. She checks a fingerstick almost hourly.  She states that she can drop her sugar 140 points an hour.    **Decadron IV is ordered preoperatively. Will double check with Dr. Marry Guan if he is still planning on using preoperatively.

## 2018-01-20 NOTE — Patient Instructions (Addendum)
Your procedure is scheduled on: Wednesday, MARCH 20  Report to [place]  To find out your arrival time please call [phone] between     1PM - 3PM on Tuesday, MARCH 19  Remember: Instructions that are not followed completely may result in serious  medical risk, up to and including death, or upon the discretion of your surgeon  and anesthesiologist your surgery may need to be rescheduled.     _X__ 1. Do not eat food after midnight the night before your procedure.                 No gum chewing, lozengers, glucose or hard candies.                  You may drink clear liquids up to 2 hours                 before you are scheduled to arrive for your surgery-                   DO not drink clear liquids within 2 hours of the start of your surgery.                  Clear Liquids include:  water, apple juice without pulp, clear carbohydrate                 drink such as Clearfast of Gatorade, Black Coffee or Tea (Do not add                 anything to coffee or tea).  __X__2.  On the morning of surgery brush your teeth with toothpaste and water,                       You may rinse your mouth with mouthwash if you wish.                                  Do not swallow any toothpaste of mouthwash.     _X__ 3.  No Alcohol for 24 hours before or after surgery.   _X__ 4.  Do Not Smoke or use e-cigarettes For 24 Hours Prior to Your Surgery.                 Do not use any chewable tobacco products for at least 6 hours prior to                 surgery.  ____  5.  Bring all medications with you on the day of surgery if instructed.   __x_  6.  Notify your doctor if there is any change in your medical condition      (cold, fever, infections).     Do not wear jewelry, make-up, hairpins, clips or nail polish. Do not wear lotions, powders, or perfumes. You may wear deodorant. Do not shave 48 hours prior to surgery. Men may shave face and  neck. Do not bring valuables to the hospital.    [facility] is not responsible for any belongings or valuables.  Contacts, dentures or bridgework may not be worn into surgery. Leave your suitcase in the car. After surgery it may be brought to your room. For patients admitted to the hospital, discharge time is determined by your treatment team.   Patients discharged the day of surgery will  not be allowed to drive home.   Please read over the following fact sheets that you were given:   Preparing for surgery               MRSA: stop the spread  ____ Take these medicines the morning of surgery with A SIP OF WATER:    1. synthroid  2.   3.     ____ Fleet Enema (as directed)   __x__ Use CHG Soap as directed  ____ Use inhalers on the day of surgery  _X___ Stop ASPIRIN PRODUCTS AS OF MARCH 13th              THIS INCLUDES EXCEDRIN / BC POWDER / GOODYS POWDER  _X___ Stop Anti-inflammatories on MARCH 13              THIS INCLUDES ADVIL / ALEVE / IBUPROFEN / MOTRIN /                    NAPROSYN                            YOU MAY TAKE TYLENOL   _X___ Stop supplements until after surgery.                   THIS INCLUDES BIOTIN / MELATONIN YOU MAY CONTINUE TAKING VITAMIN B12, CALCIUM AND CRESTOR  ____ Bring C-Pap to the hospital.   WEAR FIRM SHOES WITH RUBBER BACKING HAVE LOOSE FITTING CLOTHES FOR [facility] [employer]  BRING A COPY OF YOUR MEDICAL POA FOR YOUR CHART  HAVE STOOL SOFTENERS AT HOME  BRING ACCESSORIES FOR ALL OF YOUR DEVICES  CONTACT DR. PAUL TO SEE IF YOU NEED ANY CHANGES TO   YOUR INSULIN DOSING.  DO NOT TAKE GLUCOSE TABLETS    ON THE MORNING OF SURGERY

## 2018-01-22 LAB — URINE CULTURE
Culture: NO GROWTH
SPECIAL REQUESTS: NORMAL

## 2018-01-23 LAB — IGE

## 2018-01-25 LAB — HEMOGLOBIN A1C
HEMOGLOBIN A1C: 6.5 % — AB (ref 4.8–5.6)
MEAN PLASMA GLUCOSE: 139.85 mg/dL

## 2018-01-26 NOTE — Pre-Procedure Instructions (Signed)
HGB A1C FAXED TO DR Marry Guan

## 2018-02-02 MED ORDER — TRANEXAMIC ACID 1000 MG/10ML IV SOLN
1000.0000 mg | INTRAVENOUS | Status: AC
  Administered 2018-02-03: 1000 mg via INTRAVENOUS
  Filled 2018-02-02: qty 10

## 2018-02-02 MED ORDER — CEFAZOLIN SODIUM-DEXTROSE 2-4 GM/100ML-% IV SOLN
2.0000 g | INTRAVENOUS | Status: AC
  Administered 2018-02-03: 2 g via INTRAVENOUS

## 2018-02-03 ENCOUNTER — Encounter: Payer: Self-pay | Admitting: Orthopedic Surgery

## 2018-02-03 ENCOUNTER — Other Ambulatory Visit: Payer: Self-pay

## 2018-02-03 ENCOUNTER — Inpatient Hospital Stay: Payer: PPO | Admitting: Certified Registered Nurse Anesthetist

## 2018-02-03 ENCOUNTER — Encounter
Admission: RE | Disposition: A | Discharge status: Home Health | Payer: Self-pay | Source: Ambulatory Visit | Attending: Orthopedic Surgery

## 2018-02-03 ENCOUNTER — Inpatient Hospital Stay: Payer: PPO

## 2018-02-03 ENCOUNTER — Inpatient Hospital Stay
Admission: RE | Admit: 2018-02-03 | Discharge: 2018-02-05 | DRG: 470 | Disposition: A | Discharge status: Home Health | Payer: PPO | Source: Ambulatory Visit | Attending: Orthopedic Surgery | Admitting: Orthopedic Surgery

## 2018-02-03 DIAGNOSIS — E109 Type 1 diabetes mellitus without complications: Secondary | ICD-10-CM | POA: Diagnosis present

## 2018-02-03 DIAGNOSIS — E039 Hypothyroidism, unspecified: Secondary | ICD-10-CM | POA: Insufficient documentation

## 2018-02-03 DIAGNOSIS — Z96652 Presence of left artificial knee joint: Secondary | ICD-10-CM | POA: Diagnosis not present

## 2018-02-03 DIAGNOSIS — E1022 Type 1 diabetes mellitus with diabetic chronic kidney disease: Secondary | ICD-10-CM | POA: Diagnosis not present

## 2018-02-03 DIAGNOSIS — M1712 Unilateral primary osteoarthritis, left knee: Principal | ICD-10-CM | POA: Diagnosis present

## 2018-02-03 DIAGNOSIS — N189 Chronic kidney disease, unspecified: Secondary | ICD-10-CM | POA: Diagnosis not present

## 2018-02-03 DIAGNOSIS — J45909 Unspecified asthma, uncomplicated: Secondary | ICD-10-CM | POA: Diagnosis not present

## 2018-02-03 DIAGNOSIS — M25562 Pain in left knee: Secondary | ICD-10-CM | POA: Diagnosis present

## 2018-02-03 DIAGNOSIS — Z471 Aftercare following joint replacement surgery: Secondary | ICD-10-CM | POA: Diagnosis not present

## 2018-02-03 DIAGNOSIS — K219 Gastro-esophageal reflux disease without esophagitis: Secondary | ICD-10-CM | POA: Diagnosis present

## 2018-02-03 DIAGNOSIS — M26609 Unspecified temporomandibular joint disorder, unspecified side: Secondary | ICD-10-CM | POA: Insufficient documentation

## 2018-02-03 DIAGNOSIS — Z9641 Presence of insulin pump (external) (internal): Secondary | ICD-10-CM | POA: Diagnosis present

## 2018-02-03 DIAGNOSIS — E785 Hyperlipidemia, unspecified: Secondary | ICD-10-CM | POA: Diagnosis not present

## 2018-02-03 DIAGNOSIS — Z96659 Presence of unspecified artificial knee joint: Secondary | ICD-10-CM

## 2018-02-03 HISTORY — PX: KNEE ARTHROPLASTY: SHX992

## 2018-02-03 LAB — ABO/RH: ABO/RH(D): O NEG

## 2018-02-03 LAB — GLUCOSE, CAPILLARY
GLUCOSE-CAPILLARY: 207 mg/dL — AB (ref 65–99)
GLUCOSE-CAPILLARY: 289 mg/dL — AB (ref 65–99)
GLUCOSE-CAPILLARY: 210 mg/dL — AB (ref 65–99)
Glucose-Capillary: 317 mg/dL — ABNORMAL HIGH (ref 65–99)
Glucose-Capillary: 257 mg/dL — ABNORMAL HIGH (ref 65–99)
Glucose-Capillary: 229 mg/dL — ABNORMAL HIGH (ref 65–99)

## 2018-02-03 SURGERY — ARTHROPLASTY, KNEE, TOTAL, USING IMAGELESS COMPUTER-ASSISTED NAVIGATION
Anesthesia: Spinal | Laterality: Left

## 2018-02-03 MED ORDER — ALUM & MAG HYDROXIDE-SIMETH 200-200-20 MG/5ML PO SUSP: 30.0000 mL | ORAL | Status: DC | PRN

## 2018-02-03 MED ORDER — SODIUM CHLORIDE 0.9 % IR SOLN
Status: DC | PRN
  Administered 2018-02-03: 8 mL

## 2018-02-03 MED ORDER — BUPIVACAINE HCL (PF) 0.25 % IJ SOLN
INTRAMUSCULAR | Status: AC
  Filled 2018-02-03: qty 60

## 2018-02-03 MED ORDER — LEVOTHYROXINE SODIUM 100 MCG PO TABS
100.0000 ug | ORAL_TABLET | Freq: Every day | ORAL | Status: DC
  Administered 2018-02-04 – 2018-02-05 (×2): 100 ug via ORAL
  Filled 2018-02-03 (×2): qty 1

## 2018-02-03 MED ORDER — FENTANYL CITRATE (PF) 100 MCG/2ML IJ SOLN
INTRAMUSCULAR | Status: AC
  Filled 2018-02-03: qty 2

## 2018-02-03 MED ORDER — GABAPENTIN 300 MG PO CAPS
300.0000 mg | ORAL_CAPSULE | Freq: Every day | ORAL | Status: DC
  Administered 2018-02-03 – 2018-02-04 (×2): 300 mg via ORAL
  Filled 2018-02-03 (×2): qty 1

## 2018-02-03 MED ORDER — FERROUS SULFATE 325 (65 FE) MG PO TABS
325.0000 mg | ORAL_TABLET | Freq: Two times a day (BID) | ORAL | Status: DC
  Administered 2018-02-03 – 2018-02-05 (×4): 325 mg via ORAL
  Filled 2018-02-03 (×4): qty 1

## 2018-02-03 MED ORDER — BUPIVACAINE HCL (PF) 0.25 % IJ SOLN
INTRAMUSCULAR | Status: DC | PRN
  Administered 2018-02-03: 60 mL

## 2018-02-03 MED ORDER — GLUCOSE 4 G PO CHEW: 15.0000 g | CHEWABLE_TABLET | Freq: Three times a day (TID) | ORAL | Status: DC | PRN

## 2018-02-03 MED ORDER — SENNOSIDES-DOCUSATE SODIUM 8.6-50 MG PO TABS
1.0000 | ORAL_TABLET | Freq: Two times a day (BID) | ORAL | Status: DC
  Administered 2018-02-03 – 2018-02-04 (×3): 1 via ORAL
  Filled 2018-02-03 (×4): qty 1

## 2018-02-03 MED ORDER — CHLORHEXIDINE GLUCONATE 4 % EX LIQD: 60.0000 mL | Freq: Once | CUTANEOUS | Status: DC

## 2018-02-03 MED ORDER — FLEET ENEMA 7-19 GM/118ML RE ENEM: 1.0000 | ENEMA | Freq: Once | RECTAL | Status: DC | PRN

## 2018-02-03 MED ORDER — TRANEXAMIC ACID 1000 MG/10ML IV SOLN
1000.0000 mg | Freq: Once | INTRAVENOUS | Status: AC
  Administered 2018-02-03: 1000 mg via INTRAVENOUS
  Filled 2018-02-03: qty 10

## 2018-02-03 MED ORDER — TRAMADOL HCL 50 MG PO TABS: 50.0000 mg | ORAL_TABLET | ORAL | Status: DC | PRN

## 2018-02-03 MED ORDER — FENTANYL CITRATE (PF) 100 MCG/2ML IJ SOLN
INTRAMUSCULAR | Status: DC | PRN
  Administered 2018-02-03: 50 ug via INTRAVENOUS

## 2018-02-03 MED ORDER — ACETAMINOPHEN 325 MG PO TABS: 325.0000 mg | ORAL_TABLET | Freq: Four times a day (QID) | ORAL | Status: DC | PRN

## 2018-02-03 MED ORDER — MAGNESIUM HYDROXIDE 400 MG/5ML PO SUSP
30.0000 mL | Freq: Every day | ORAL | Status: DC | PRN
  Administered 2018-02-04: 30 mL via ORAL
  Filled 2018-02-03: qty 30

## 2018-02-03 MED ORDER — ROSUVASTATIN CALCIUM 10 MG PO TABS
10.0000 mg | ORAL_TABLET | Freq: Every day | ORAL | Status: DC
  Administered 2018-02-03 – 2018-02-04 (×2): 10 mg via ORAL
  Filled 2018-02-03 (×2): qty 1

## 2018-02-03 MED ORDER — BUPIVACAINE HCL (PF) 0.5 % IJ SOLN
INTRAMUSCULAR | Status: DC | PRN
  Administered 2018-02-03: 3 mL

## 2018-02-03 MED ORDER — PROPOFOL 500 MG/50ML IV EMUL
INTRAVENOUS | Status: AC
  Filled 2018-02-03: qty 50

## 2018-02-03 MED ORDER — SODIUM CHLORIDE 0.9 % IV SOLN
INTRAVENOUS | Status: DC | PRN
  Administered 2018-02-03: 20 ug/min via INTRAVENOUS

## 2018-02-03 MED ORDER — SODIUM CHLORIDE 0.9 % IV SOLN
INTRAVENOUS | Status: DC
  Administered 2018-02-03 – 2018-02-04 (×3): via INTRAVENOUS

## 2018-02-03 MED ORDER — BUPIVACAINE LIPOSOME 1.3 % IJ SUSP
INTRAMUSCULAR | Status: AC
  Filled 2018-02-03: qty 20

## 2018-02-03 MED ORDER — PROPOFOL 500 MG/50ML IV EMUL
INTRAVENOUS | Status: DC | PRN
  Administered 2018-02-03: 75 ug/kg/min via INTRAVENOUS

## 2018-02-03 MED ORDER — HYDROMORPHONE HCL 1 MG/ML IJ SOLN: 0.5000 mg | INTRAMUSCULAR | Status: DC | PRN

## 2018-02-03 MED ORDER — DEXAMETHASONE SODIUM PHOSPHATE 10 MG/ML IJ SOLN
8.0000 mg | Freq: Once | INTRAMUSCULAR | Status: AC
  Administered 2018-02-03: 8 mg via INTRAVENOUS

## 2018-02-03 MED ORDER — ACETAMINOPHEN 10 MG/ML IV SOLN
INTRAVENOUS | Status: AC
  Filled 2018-02-03: qty 100

## 2018-02-03 MED ORDER — DIPHENHYDRAMINE HCL 12.5 MG/5ML PO ELIX: 12.5000 mg | ORAL_SOLUTION | ORAL | Status: DC | PRN

## 2018-02-03 MED ORDER — PHENOL 1.4 % MT LIQD
1.0000 | OROMUCOSAL | Status: DC | PRN
  Filled 2018-02-03: qty 177

## 2018-02-03 MED ORDER — INSULIN PUMP
SUBCUTANEOUS | Status: DC
  Administered 2018-02-03: 1.3 via SUBCUTANEOUS
  Administered 2018-02-03: 2 via SUBCUTANEOUS
  Administered 2018-02-03: 8.1 via SUBCUTANEOUS
  Administered 2018-02-04: 1.5 via SUBCUTANEOUS
  Administered 2018-02-04: 0.6 via SUBCUTANEOUS
  Administered 2018-02-04: 17:00:00 via SUBCUTANEOUS
  Administered 2018-02-04: 0.6 via SUBCUTANEOUS
  Administered 2018-02-04: 3 via SUBCUTANEOUS
  Administered 2018-02-04: 0.8 via SUBCUTANEOUS
  Administered 2018-02-05: 0.5 via SUBCUTANEOUS
  Administered 2018-02-05: 0.6 via SUBCUTANEOUS
  Administered 2018-02-05: 2.8 via SUBCUTANEOUS
  Filled 2018-02-03: qty 1

## 2018-02-03 MED ORDER — ONDANSETRON HCL 4 MG/2ML IJ SOLN
INTRAMUSCULAR | Status: DC | PRN
  Administered 2018-02-03: 4 mg via INTRAVENOUS

## 2018-02-03 MED ORDER — BISACODYL 10 MG RE SUPP
10.0000 mg | Freq: Every day | RECTAL | Status: DC | PRN
  Administered 2018-02-05: 10 mg via RECTAL
  Filled 2018-02-03: qty 1

## 2018-02-03 MED ORDER — CLINDAMYCIN PHOSPHATE 600 MG/50ML IV SOLN
600.0000 mg | Freq: Four times a day (QID) | INTRAVENOUS | Status: AC
  Administered 2018-02-03 – 2018-02-04 (×4): 600 mg via INTRAVENOUS
  Filled 2018-02-03 (×4): qty 50

## 2018-02-03 MED ORDER — PROPOFOL 10 MG/ML IV BOLUS
INTRAVENOUS | Status: AC
  Filled 2018-02-03: qty 20

## 2018-02-03 MED ORDER — VITAMIN B-12 1000 MCG PO TABS
1000.0000 ug | ORAL_TABLET | Freq: Every day | ORAL | Status: DC
Start: 2018-02-03 — End: 2018-02-05
  Administered 2018-02-04: 1000 ug via ORAL
  Filled 2018-02-03: qty 1

## 2018-02-03 MED ORDER — ACETAMINOPHEN 10 MG/ML IV SOLN
1000.0000 mg | Freq: Four times a day (QID) | INTRAVENOUS | Status: AC
  Administered 2018-02-03 – 2018-02-04 (×4): 1000 mg via INTRAVENOUS
  Filled 2018-02-03 (×4): qty 100

## 2018-02-03 MED ORDER — SODIUM CHLORIDE 0.9 % IJ SOLN
INTRAMUSCULAR | Status: DC | PRN
  Administered 2018-02-03: 40 mL via INTRAVENOUS

## 2018-02-03 MED ORDER — CALCIUM CARBONATE-VITAMIN D 500-200 MG-UNIT PO TABS
1.0000 | ORAL_TABLET | Freq: Two times a day (BID) | ORAL | Status: DC
  Administered 2018-02-04 – 2018-02-05 (×3): 1 via ORAL
  Filled 2018-02-03 (×5): qty 1

## 2018-02-03 MED ORDER — FENTANYL CITRATE (PF) 100 MCG/2ML IJ SOLN: 25.0000 ug | INTRAMUSCULAR | Status: DC | PRN

## 2018-02-03 MED ORDER — CELECOXIB 200 MG PO CAPS
200.0000 mg | ORAL_CAPSULE | Freq: Two times a day (BID) | ORAL | Status: DC
  Administered 2018-02-03 – 2018-02-04 (×3): 200 mg via ORAL
  Filled 2018-02-03 (×4): qty 1

## 2018-02-03 MED ORDER — PHENYLEPHRINE HCL 10 MG/ML IJ SOLN
INTRAMUSCULAR | Status: DC | PRN
  Administered 2018-02-03 (×2): 200 ug via INTRAVENOUS
  Administered 2018-02-03 (×4): 100 ug via INTRAVENOUS

## 2018-02-03 MED ORDER — DEXAMETHASONE SODIUM PHOSPHATE 10 MG/ML IJ SOLN
INTRAMUSCULAR | Status: AC
  Administered 2018-02-03: 8 mg via INTRAVENOUS
  Filled 2018-02-03: qty 1

## 2018-02-03 MED ORDER — ONDANSETRON HCL 4 MG PO TABS: 4.0000 mg | ORAL_TABLET | Freq: Four times a day (QID) | ORAL | Status: DC | PRN

## 2018-02-03 MED ORDER — ENOXAPARIN SODIUM 30 MG/0.3ML ~~LOC~~ SOLN
30.0000 mg | Freq: Two times a day (BID) | SUBCUTANEOUS | Status: DC
  Administered 2018-02-04 – 2018-02-05 (×3): 30 mg via SUBCUTANEOUS
  Filled 2018-02-03 (×3): qty 0.3

## 2018-02-03 MED ORDER — GLUCAGON HCL RDNA (DIAGNOSTIC) 1 MG IJ SOLR: 1.0000 mg | Freq: Every day | INTRAMUSCULAR | Status: DC | PRN

## 2018-02-03 MED ORDER — MIDAZOLAM HCL 2 MG/2ML IJ SOLN
INTRAMUSCULAR | Status: AC
  Filled 2018-02-03: qty 2

## 2018-02-03 MED ORDER — METOCLOPRAMIDE HCL 5 MG/ML IJ SOLN: 5.0000 mg | Freq: Three times a day (TID) | INTRAMUSCULAR | Status: DC | PRN

## 2018-02-03 MED ORDER — SODIUM CHLORIDE 0.9 % IV SOLN
INTRAVENOUS | Status: DC
  Administered 2018-02-03 (×2): via INTRAVENOUS

## 2018-02-03 MED ORDER — LIDOCAINE HCL (CARDIAC) 20 MG/ML IV SOLN
INTRAVENOUS | Status: DC | PRN
  Administered 2018-02-03: 30 mg via INTRAVENOUS

## 2018-02-03 MED ORDER — LIDOCAINE HCL (PF) 2 % IJ SOLN
INTRAMUSCULAR | Status: AC
  Filled 2018-02-03: qty 10

## 2018-02-03 MED ORDER — MENTHOL 3 MG MT LOZG
1.0000 | LOZENGE | OROMUCOSAL | Status: DC | PRN
  Filled 2018-02-03: qty 9

## 2018-02-03 MED ORDER — POLYVINYL ALCOHOL 1.4 % OP SOLN
1.0000 [drp] | Freq: Three times a day (TID) | OPHTHALMIC | Status: DC | PRN
  Filled 2018-02-03: qty 15

## 2018-02-03 MED ORDER — CEFAZOLIN SODIUM-DEXTROSE 2-4 GM/100ML-% IV SOLN
INTRAVENOUS | Status: AC
  Filled 2018-02-03: qty 100

## 2018-02-03 MED ORDER — ONDANSETRON HCL 4 MG/2ML IJ SOLN
4.0000 mg | Freq: Four times a day (QID) | INTRAMUSCULAR | Status: DC | PRN
  Administered 2018-02-04: 4 mg via INTRAVENOUS
  Filled 2018-02-03: qty 2

## 2018-02-03 MED ORDER — GABAPENTIN 300 MG PO CAPS
ORAL_CAPSULE | ORAL | Status: AC
  Administered 2018-02-03: 300 mg via ORAL
  Filled 2018-02-03: qty 1

## 2018-02-03 MED ORDER — FAMOTIDINE 20 MG PO TABS
ORAL_TABLET | ORAL | Status: AC
  Administered 2018-02-03: 20 mg via ORAL
  Filled 2018-02-03: qty 1

## 2018-02-03 MED ORDER — METOCLOPRAMIDE HCL 10 MG PO TABS
10.0000 mg | ORAL_TABLET | Freq: Three times a day (TID) | ORAL | Status: DC
  Administered 2018-02-03 – 2018-02-05 (×7): 10 mg via ORAL
  Filled 2018-02-03 (×7): qty 1

## 2018-02-03 MED ORDER — OXYCODONE HCL 5 MG PO TABS
5.0000 mg | ORAL_TABLET | ORAL | Status: DC | PRN
  Administered 2018-02-03 – 2018-02-05 (×7): 5 mg via ORAL
  Filled 2018-02-03 (×7): qty 1

## 2018-02-03 MED ORDER — GABAPENTIN 300 MG PO CAPS
300.0000 mg | ORAL_CAPSULE | Freq: Once | ORAL | Status: AC
  Administered 2018-02-03: 300 mg via ORAL

## 2018-02-03 MED ORDER — SODIUM CHLORIDE 0.9 % IJ SOLN
INTRAMUSCULAR | Status: AC
  Filled 2018-02-03: qty 50

## 2018-02-03 MED ORDER — PANTOPRAZOLE SODIUM 40 MG PO TBEC
40.0000 mg | DELAYED_RELEASE_TABLET | Freq: Two times a day (BID) | ORAL | Status: DC
  Administered 2018-02-03 – 2018-02-04 (×3): 40 mg via ORAL
  Filled 2018-02-03 (×4): qty 1

## 2018-02-03 MED ORDER — OXYCODONE HCL 5 MG PO TABS
10.0000 mg | ORAL_TABLET | ORAL | Status: DC | PRN
Start: 2018-02-03 — End: 2018-02-05
  Filled 2018-02-03: qty 2

## 2018-02-03 MED ORDER — METOCLOPRAMIDE HCL 10 MG PO TABS
5.0000 mg | ORAL_TABLET | Freq: Three times a day (TID) | ORAL | Status: DC | PRN
  Administered 2018-02-03: 10 mg via ORAL

## 2018-02-03 MED ORDER — SODIUM CHLORIDE 0.9 % IV SOLN
INTRAVENOUS | Status: DC | PRN
  Administered 2018-02-03: 60 mL

## 2018-02-03 MED ORDER — ACETAMINOPHEN 10 MG/ML IV SOLN
INTRAVENOUS | Status: DC | PRN
  Administered 2018-02-03: 1000 mg via INTRAVENOUS

## 2018-02-03 MED ORDER — PROPOFOL 10 MG/ML IV BOLUS
INTRAVENOUS | Status: AC
Start: 2018-02-03 — End: 2018-02-03
  Filled 2018-02-03: qty 20

## 2018-02-03 MED ORDER — FAMOTIDINE 20 MG PO TABS
20.0000 mg | ORAL_TABLET | Freq: Once | ORAL | Status: AC
  Administered 2018-02-03: 20 mg via ORAL

## 2018-02-03 MED ORDER — BIOTIN 10000 MCG PO TABS: 10000.0000 ug | ORAL_TABLET | Freq: Every day | ORAL | Status: DC

## 2018-02-03 MED ORDER — MIDAZOLAM HCL 5 MG/5ML IJ SOLN
INTRAMUSCULAR | Status: DC | PRN
  Administered 2018-02-03: 2 mg via INTRAVENOUS

## 2018-02-03 MED ORDER — GENTAMICIN SULFATE 40 MG/ML IJ SOLN
INTRAMUSCULAR | Status: AC
  Filled 2018-02-03: qty 10

## 2018-02-03 MED ORDER — ONDANSETRON HCL 4 MG/2ML IJ SOLN: 4.0000 mg | Freq: Once | INTRAMUSCULAR | Status: DC | PRN

## 2018-02-03 MED ORDER — BUPIVACAINE HCL (PF) 0.5 % IJ SOLN
INTRAMUSCULAR | Status: AC
  Filled 2018-02-03: qty 10

## 2018-02-03 SURGICAL SUPPLY — 64 items
BATTERY INSTRU NAVIGATION (MISCELLANEOUS) ×12 IMPLANT
BLADE SAW 1 (BLADE) ×3 IMPLANT
BLADE SAW 1/2 (BLADE) ×3 IMPLANT
BLADE SAW 70X12.5 (BLADE) ×3 IMPLANT
BONE CEMENT GENTAMICIN (Cement) ×6 IMPLANT
CANISTER SUCT 1200ML W/VALVE (MISCELLANEOUS) ×3 IMPLANT
CANISTER SUCT 3000ML PPV (MISCELLANEOUS) ×6 IMPLANT
CAPT KNEE TOTAL 3 ATTUNE ×3 IMPLANT
CEMENT BONE GENTAMICIN 40 (Cement) ×2 IMPLANT
COOLER POLAR GLACIER W/PUMP (MISCELLANEOUS) ×3 IMPLANT
CUFF TOURN 24 STER (MISCELLANEOUS) IMPLANT
CUFF TOURN 30 STER DUAL PORT (MISCELLANEOUS) ×3 IMPLANT
DRAPE SHEET LG 3/4 BI-LAMINATE (DRAPES) ×3 IMPLANT
DRSG DERMACEA 8X12 NADH (GAUZE/BANDAGES/DRESSINGS) ×3 IMPLANT
DRSG OPSITE POSTOP 4X14 (GAUZE/BANDAGES/DRESSINGS) ×3 IMPLANT
DRSG TEGADERM 4X4.75 (GAUZE/BANDAGES/DRESSINGS) ×3 IMPLANT
DURAPREP 26ML APPLICATOR (WOUND CARE) ×6 IMPLANT
ELECT CAUTERY BLADE 6.4 (BLADE) ×3 IMPLANT
ELECT REM PT RETURN 9FT ADLT (ELECTROSURGICAL) ×3
ELECTRODE REM PT RTRN 9FT ADLT (ELECTROSURGICAL) ×1 IMPLANT
EVACUATOR 1/8 PVC DRAIN (DRAIN) ×3 IMPLANT
EX-PIN ORTHOLOCK NAV 4X150 (PIN) ×6 IMPLANT
GLOVE BIOGEL M STRL SZ7.5 (GLOVE) ×6 IMPLANT
GLOVE BIOGEL PI IND STRL 9 (GLOVE) ×1 IMPLANT
GLOVE BIOGEL PI INDICATOR 9 (GLOVE) ×2
GLOVE INDICATOR 8.0 STRL GRN (GLOVE) ×3 IMPLANT
GLOVE SURG SYN 9.0  PF PI (GLOVE) ×2
GLOVE SURG SYN 9.0 PF PI (GLOVE) ×1 IMPLANT
GOWN STRL REUS W/ TWL LRG LVL3 (GOWN DISPOSABLE) ×2 IMPLANT
GOWN STRL REUS W/TWL 2XL LVL3 (GOWN DISPOSABLE) ×3 IMPLANT
GOWN STRL REUS W/TWL LRG LVL3 (GOWN DISPOSABLE) ×4
HOLDER FOLEY CATH W/STRAP (MISCELLANEOUS) ×3 IMPLANT
HOOD PEEL AWAY FLYTE STAYCOOL (MISCELLANEOUS) ×6 IMPLANT
KIT TURNOVER KIT A (KITS) ×3 IMPLANT
KNIFE SCULPS 14X20 (INSTRUMENTS) ×3 IMPLANT
LABEL OR SOLS (LABEL) ×3 IMPLANT
NDL SAFETY ECLIPSE 18X1.5 (NEEDLE) ×1 IMPLANT
NEEDLE HYPO 18GX1.5 SHARP (NEEDLE) ×2
NEEDLE SPNL 20GX3.5 QUINCKE YW (NEEDLE) ×6 IMPLANT
NS IRRIG 500ML POUR BTL (IV SOLUTION) ×3 IMPLANT
PACK TOTAL KNEE (MISCELLANEOUS) ×3 IMPLANT
PAD WRAPON POLAR KNEE (MISCELLANEOUS) ×1 IMPLANT
PIN DRILL QUICK PACK ×3 IMPLANT
PIN FIXATION 1/8DIA X 3INL (PIN) ×3 IMPLANT
PULSAVAC PLUS IRRIG FAN TIP (DISPOSABLE) ×3
SOL .9 NS 3000ML IRR  AL (IV SOLUTION) ×2
SOL .9 NS 3000ML IRR UROMATIC (IV SOLUTION) ×1 IMPLANT
SOL PREP PVP 2OZ (MISCELLANEOUS) ×3
SOLUTION PREP PVP 2OZ (MISCELLANEOUS) ×1 IMPLANT
SPONGE DRAIN TRACH 4X4 STRL 2S (GAUZE/BANDAGES/DRESSINGS) ×3 IMPLANT
STAPLER SKIN PROX 35W (STAPLE) ×3 IMPLANT
STRAP TIBIA SHORT (MISCELLANEOUS) ×3 IMPLANT
SUCTION FRAZIER HANDLE 10FR (MISCELLANEOUS) ×2
SUCTION TUBE FRAZIER 10FR DISP (MISCELLANEOUS) ×1 IMPLANT
SUT VIC AB 0 CT1 36 (SUTURE) ×3 IMPLANT
SUT VIC AB 1 CT1 36 (SUTURE) ×6 IMPLANT
SUT VIC AB 2-0 CT2 27 (SUTURE) ×3 IMPLANT
SYR 20CC LL (SYRINGE) ×3 IMPLANT
SYR 30ML LL (SYRINGE) ×6 IMPLANT
TIP FAN IRRIG PULSAVAC PLUS (DISPOSABLE) ×1 IMPLANT
TOWEL OR 17X26 4PK STRL BLUE (TOWEL DISPOSABLE) ×3 IMPLANT
TOWER CARTRIDGE SMART MIX (DISPOSABLE) ×3 IMPLANT
TRAY FOLEY W/METER SILVER 16FR (SET/KITS/TRAYS/PACK) ×3 IMPLANT
WRAPON POLAR PAD KNEE (MISCELLANEOUS) ×3

## 2018-02-03 NOTE — Progress Notes (Signed)
PHARMACIST - PHYSICIAN ORDER COMMUNICATION  CONCERNING: P&T Medication Policy on Herbal Medications  DESCRIPTION:  This patient's order for:  Biotin  has been noted.  This product(s) is classified as an "herbal" or natural product. Due to a lack of definitive safety studies or FDA approval, nonstandard manufacturing practices, plus the potential risk of unknown drug-drug interactions while on inpatient medications, the Pharmacy and Therapeutics Committee does not permit the use of "herbal" or natural products of this type within [facility].   ACTION TAKEN: The pharmacy department is unable to verify this order at this time Please reevaluate patient's clinical condition at discharge and address if the herbal or natural product(s) should be resumed at that time.    

## 2018-02-03 NOTE — Anesthesia Post-op Follow-up Note (Signed)
Anesthesia QCDR form completed.        

## 2018-02-03 NOTE — H&P (Signed)
The patient has been re-examined, and the chart reviewed, and there have been no interval changes to the documented history and physical.    The risks, benefits, and alternatives have been discussed at length. The patient expressed understanding of the risks benefits and agreed with plans for surgical intervention.  James P. Hooten, Jr. M.D.    

## 2018-02-03 NOTE — Progress Notes (Signed)
Pt administered 1.3 units novolog for blood sugar of 246.

## 2018-02-03 NOTE — Progress Notes (Signed)
Pt has insulin pump.  Pt has administered herself a total of 8 units of insulin since lunch time.

## 2018-02-03 NOTE — Progress Notes (Addendum)
Inpatient Diabetes Program Recommendations  AACE/ADA: New Consensus Statement on Inpatient Glycemic Control (2015)  Target Ranges:  Prepandial:   less than 140 mg/dL      Peak postprandial:   less than 180 mg/dL (1-2 hours)      Critically ill patients:  140 - 180 mg/dL   Lab Results  Component Value Date   GLUCAP 257 (H) 02/03/2018   HGBA1C 6.5 (H) 01/20/2018    Review of Glycemic ControlResults for SHARDAY, MICHL (MRN [id number]) as of 02/03/2018 14:34  Ref. Range 02/03/2018 06:14 02/03/2018 08:51 02/03/2018 10:47 02/03/2018 12:09  Glucose-Capillary Latest Ref Range: 65 - 99 mg/dL 207 (H) 289 (H) 317 (H) 257 (H)    Inpatient Diabetes Program Recommendations:    Spoke with patient regarding insulin pump.  She states that she has insulin pump on and also has [employer].  Patient understands that she will need to communicate with RN's regarding her insulin dosing. Blood sugars elevated, likely from steroids this AM. Patient is correcting with insulin pump.  RN is Customer service manager for patient.  Will follow.  Thanks,  Adah Perl, RN, BC-ADM Inpatient Diabetes Coordinator Pager [phone] (8a-5p)

## 2018-02-03 NOTE — Anesthesia Preprocedure Evaluation (Signed)
Anesthesia Evaluation  Patient identified by MRN, date of birth, ID band Patient awake    Reviewed: Allergy & Precautions, NPO status , Patient's Chart, lab work & pertinent test results  History of Anesthesia Complications (+) PONV  Airway Mallampati: II       Dental   Pulmonary asthma , neg sleep apnea, neg COPD,           Cardiovascular (-) hypertension(-) Past MI and (-) CHF (-) dysrhythmias + Valvular Problems/Murmurs (murmur, no tx)      Neuro/Psych neg Seizures    GI/Hepatic GERD (remote hx)  ,  Endo/Other  diabetes, Type 1Hypothyroidism   Renal/GU Renal InsufficiencyRenal disease     Musculoskeletal   Abdominal   Peds  Hematology  (+) anemia ,   Anesthesia Other Findings   Reproductive/Obstetrics                             Anesthesia Physical Anesthesia Plan  ASA: III  Anesthesia Plan: Spinal   Post-op Pain Management:    Induction:   PONV Risk Score and Plan:   Airway Management Planned:   Additional Equipment:   Intra-op Plan:   Post-operative Plan:   Informed Consent: I have reviewed the patients History and Physical, chart, labs and discussed the procedure including the risks, benefits and alternatives for the proposed anesthesia with the patient or authorized representative who has indicated his/her understanding and acceptance.     Plan Discussed with:   Anesthesia Plan Comments:         Anesthesia Quick Evaluation

## 2018-02-03 NOTE — Progress Notes (Signed)
Blood sugar 317 upon arrival to pacu  Pt gave herself insulin via insulin pump

## 2018-02-03 NOTE — Plan of Care (Signed)
  Progressing Education: Knowledge of General Education information will improve 02/03/2018 1504 - Progressing by Milderd Meager, RN Health Behavior/Discharge Planning: Ability to manage health-related needs will improve 02/03/2018 1504 - Progressing by Milderd Meager, RN Clinical Measurements: Ability to maintain clinical measurements within normal limits will improve 02/03/2018 1504 - Progressing by Milderd Meager, RN Will remain free from infection 02/03/2018 1504 - Progressing by Milderd Meager, RN Diagnostic test results will improve 02/03/2018 1504 - Progressing by Milderd Meager, RN Respiratory complications will improve 02/03/2018 1504 - Progressing by Milderd Meager, RN Cardiovascular complication will be avoided 02/03/2018 1504 - Progressing by Milderd Meager, RN Activity: Risk for activity intolerance will decrease 02/03/2018 1504 - Progressing by Milderd Meager, RN Nutrition: Adequate nutrition will be maintained 02/03/2018 1504 - Progressing by Milderd Meager, RN Coping: Level of anxiety will decrease 02/03/2018 1504 - Progressing by Milderd Meager, RN Elimination: Will not experience complications related to bowel motility 02/03/2018 1504 - Progressing by Milderd Meager, RN Will not experience complications related to urinary retention 02/03/2018 1504 - Progressing by Milderd Meager, RN Pain Managment: General experience of comfort will improve 02/03/2018 1504 - Progressing by Milderd Meager, RN Safety: Ability to remain free from injury will improve 02/03/2018 1504 - Progressing by Milderd Meager, RN Skin Integrity: Risk for impaired skin integrity will decrease 02/03/2018 1504 - Progressing by Milderd Meager, RN Education: Knowledge of the prescribed therapeutic regimen will improve 02/03/2018 1504 - Progressing by Milderd Meager, RN Activity: Ability to avoid complications of mobility impairment will  improve 02/03/2018 1504 - Progressing by Milderd Meager, RN Range of joint motion will improve 02/03/2018 1504 - Progressing by Milderd Meager, RN Clinical Measurements: Postoperative complications will be avoided or minimized 02/03/2018 1504 - Progressing by Milderd Meager, RN Pain Management: Pain level will decrease with appropriate interventions 02/03/2018 1504 - Progressing by Milderd Meager, RN Skin Integrity: Signs of wound healing will improve 02/03/2018 1504 - Progressing by Milderd Meager, RN

## 2018-02-03 NOTE — Discharge Instructions (Signed)
°  Instructions after Total Knee Replacement ° ° Nihaal Friesen P. Chaniah Cisse, Jr., M.D.    ° Dept. of Orthopaedics & Sports Medicine ° [facility] ° [address] ° [place], [place]  [address] ° Phone: [phone]   Fax: [phone] ° °  °DIET: °• Drink plenty of non-alcoholic fluids. °• Resume your normal diet. Include foods high in fiber. ° °ACTIVITY:  °• You may use crutches or a walker with weight-bearing as tolerated, unless instructed otherwise. °• You may be weaned off of the walker or crutches by your Physical Therapist.  °• Do NOT place pillows under the knee. Anything placed under the knee could limit your ability to straighten the knee.   °• Continue doing gentle exercises. Exercising will reduce the pain and swelling, increase motion, and prevent muscle weakness.   °• Please continue to use the TED compression stockings for 6 weeks. You may remove the stockings at night, but should reapply them in the morning. °• Do not drive or operate any equipment until instructed. ° °WOUND CARE:  °• Continue to use the PolarCare or ice packs periodically to reduce pain and swelling. °• You may bathe or shower after the staples are removed at the first office visit following surgery. ° °MEDICATIONS: °• You may resume your regular medications. °• Please take the pain medication as prescribed on the medication. °• Do not take pain medication on an empty stomach. °• You have been given a prescription for a blood thinner (Lovenox or Coumadin). Please take the medication as instructed. (NOTE: After completing a 2 week course of Lovenox, take one Enteric-coated aspirin once a day. This along with elevation will help reduce the possibility of phlebitis in your operated leg.) °• Do not drive or drink alcoholic beverages when taking pain medications. ° °CALL THE OFFICE FOR: °• Temperature above 101 degrees °• Excessive bleeding or drainage on the dressing. °• Excessive swelling, coldness, or paleness of the toes. °• Persistent  nausea and vomiting. ° °FOLLOW-UP:  °• You should have an appointment to return to the office in 10-14 days after surgery. °• Arrangements have been made for continuation of Physical Therapy (either home therapy or outpatient therapy). °  °

## 2018-02-03 NOTE — Anesthesia Procedure Notes (Signed)
Date/Time: 02/03/2018 7:25 AM Performed by: Rudean Hitt, CRNA Pre-anesthesia Checklist: Patient identified, Emergency Drugs available, Suction available, Patient being monitored and Timeout performed Patient Re-evaluated:Patient Re-evaluated prior to induction Oxygen Delivery Method: Nasal cannula Placement Confirmation: positive ETCO2

## 2018-02-03 NOTE — Anesthesia Procedure Notes (Signed)
Spinal  Patient location during procedure: OR Start time: 02/03/2018 7:15 AM End time: 02/03/2018 7:26 AM Staffing Performed: anesthesiologist  Preanesthetic Checklist Completed: patient identified, site marked, surgical consent, pre-op evaluation, timeout performed, IV checked, risks and benefits discussed and monitors and equipment checked Spinal Block Patient position: sitting Prep: Betadine Patient monitoring: heart rate, continuous pulse ox, blood pressure and cardiac monitor Approach: midline Location: L4-5 Injection technique: single-shot Needle Needle type: Whitacre and Introducer  Needle gauge: 24 G Needle length: 9 cm Additional Notes Negative paresthesia. Negative blood return. Positive free-flowing CSF. Expiration date of kit checked and confirmed. Patient tolerated procedure well, without complications.

## 2018-02-03 NOTE — Progress Notes (Signed)
Physical Therapy Evaluation Patient Details Name: Elizabeth [name] MRN: [id number] DOB: 1941/06/24 Today's Date: 02/03/2018   History of Present Illness  Pt underwent L TKR without reported post-op complications. PMH includes DMI, asthma  Clinical Impression  Pt admitted with above diagnosis. Pt currently with functional limitations due to the deficits listed below (see PT Problem List). Pt is modified independent for bed mobility and CGA only for transferes. Pt provided cues for safe hand placement during transfers. Good sequencing and good stability once upright in standing with bilateral UE support on rolling walker. Pt able to ambulate from bed to door and back to recliner with rolling walker. Education and cues for proper sequencing with walker. Decreased weight shift to LLE but good stability with ambulation. Pt denies DOE and no LLE buckling noted. She is able to complete all supine exercises as instructed. AAROM is -2 to 78 degrees AAROM with flexion being limited by pain. Pt will benefit from PT services to address deficits in strength, balance, and mobility in order to return to full function at home.     Follow Up Recommendations Home health PT    Equipment Recommendations  None recommended by PT    Recommendations for Other Services       Precautions / Restrictions Precautions Precautions: Fall;Knee Precaution Booklet Issued: Yes (comment) Restrictions Weight Bearing Restrictions: Yes      Mobility  Bed Mobility Overal bed mobility: Modified Independent             General bed mobility comments: HOB elevated, use of bed rails, increased time  Transfers Overall transfer level: Needs assistance Equipment used: Rolling walker (2 wheeled) Transfers: Sit to/from Stand Sit to Stand: Min guard         General transfer comment: Pt provided cues for safe hand placement. Godo sequencing and good stability once upright in standing with bilateral UE support on rolling  walker  Ambulation/Gait Ambulation/Gait assistance: Min guard Ambulation Distance (Feet): 15 Feet Assistive device: Rolling walker (2 wheeled)       General Gait Details: Pt able to ambulate from bed to door and back to recliner with rolling walker. Education and cues for proper sequencing with walker. Decreased weight shift to LLE but good stability with ambulation. Pt denies DOE and no LLE buckling noted  Stairs            Wheelchair Mobility    Modified Rankin (Stroke Patients Only)       Balance Overall balance assessment: Needs assistance Sitting-balance support: No upper extremity supported Sitting balance-Leahy Scale: Good     Standing balance support: No upper extremity supported Standing balance-Leahy Scale: Fair                               Pertinent Vitals/Pain Pain Assessment: 0-10 Pain Score: 5  Pain Location: L knee Pain Descriptors / Indicators: Aching Pain Intervention(s): Monitored during session    Home Living Family/patient expects to be discharged to:: Private residence Living Arrangements: Spouse/significant other Available Help at Discharge: Family Type of Home: House Home Access: Stairs to enter Entrance Stairs-Rails: Right Entrance Stairs-Number of Steps: 3 Home Layout: Multi-level;Laundry or work area in basement;Able to live on main level with bedroom/bathroom Home Equipment: Shower seat;Bedside commode;Walker - 2 wheels;Cane - single point;Other (comment)(Adjustable bed, has access to wheelchair if needed)      Prior Function Level of Independence: Independent         Comments:  Independent with ADLs/IADLs. Ambulates full community distances without assistive device. No falls     Hand Dominance   Dominant Hand: Right    Extremity/Trunk Assessment   Upper Extremity Assessment Upper Extremity Assessment: Overall WFL for tasks assessed    Lower Extremity Assessment Lower Extremity Assessment: LLE  deficits/detail LLE Deficits / Details: Pt able to perform SLR and SAQ without assistance. Reports full sensation in LLE. Full DF/PF. RLE strength grossly WFL       Communication   Communication: No difficulties  Cognition Arousal/Alertness: Awake/alert Behavior During Therapy: WFL for tasks assessed/performed Overall Cognitive Status: Within Functional Limits for tasks assessed                                        General Comments      Exercises Total Joint Exercises Ankle Circles/Pumps: Both;10 reps Quad Sets: Both;10 reps Gluteal Sets: Both;10 reps Towel Squeeze: Both;10 reps Short Arc Quad: Left;10 reps Heel Slides: Left;10 reps Hip ABduction/ADduction: Left;10 reps Straight Leg Raises: Left;10 reps Goniometric ROM: -2 to 78 degrees AAROM, pain limited   Assessment/Plan    PT Assessment Patient needs continued PT services  PT Problem List Decreased strength;Decreased range of motion;Decreased activity tolerance;Decreased balance;Decreased mobility;Pain       PT Treatment Interventions DME instruction;Gait training;Stair training;Therapeutic activities;Therapeutic exercise;Balance training;Neuromuscular re-education;Cognitive remediation;Patient/family education;Manual techniques    PT Goals (Current goals can be found in the Care Plan section)  Acute Rehab PT Goals Patient Stated Goal: Return to prior level of function at home with less pain PT Goal Formulation: With patient/family Time For Goal Achievement: 02/17/18 Potential to Achieve Goals: Good    Frequency BID   Barriers to discharge        Co-evaluation               AM-PAC PT "6 Clicks" Daily Activity  Outcome Measure Difficulty turning over in bed (including adjusting bedclothes, sheets and blankets)?: A Little Difficulty moving from lying on back to sitting on the side of the bed? : A Little Difficulty sitting down on and standing up from a chair with arms (e.g.,  wheelchair, bedside commode, etc,.)?: A Little Help needed moving to and from a bed to chair (including a wheelchair)?: A Little Help needed walking in hospital room?: A Little Help needed climbing 3-5 steps with a railing? : A Lot 6 Click Score: 17    End of Session Equipment Utilized During Treatment: Gait belt Activity Tolerance: Patient tolerated treatment well Patient left: in chair;with call bell/phone within reach;with chair alarm set;with SCD's reapplied;Other (comment)(towel roll under heel and polar care inplace) Nurse Communication: Mobility status PT Visit Diagnosis: Other abnormalities of gait and mobility (R26.89);Muscle weakness (generalized) (M62.81);Difficulty in walking, not elsewhere classified (R26.2);Pain Pain - Right/Left: Left Pain - part of body: Knee    Time: 7408-1448 PT Time Calculation (min) (ACUTE ONLY): 34 min   Charges:   PT Evaluation $PT Eval Low Complexity: 1 Low PT Treatments $Therapeutic Exercise: 8-22 mins   PT G Codes:        Lyndel Safe Bedie Dominey PT, DPT    Janard Culp 02/03/2018, 4:43 PM

## 2018-02-03 NOTE — NC FL2 (Signed)
[place] LEVEL OF CARE SCREENING TOOL     IDENTIFICATION  Patient Name: Elizabeth [name] Birthdate: [birth year] Sex: female Admission Date (Current Location): 02/03/2018  [place] and [place] Number:  Engineering geologist and Address:  [facility], [address], [place], [place] [address]      Provider Number: [id number]  Attending Physician Name and Address:  Dereck Leep, MD  Relative Name and Phone Number:       Current Level of Care: Hospital Recommended Level of Care: [place] Prior Approval Number:    Date Approved/Denied:   PASRR Number: [id number])  Discharge Plan: SNF    Current Diagnoses: Patient Active Problem List   Diagnosis Date Noted  . Hyperlipidemia, unspecified 02/03/2018  . Hypothyroidism, unspecified 02/03/2018  . TMJ (temporomandibular joint disorder) 02/03/2018  . S/P total knee arthroplasty 02/03/2018  . Primary osteoarthritis of left knee 12/13/2017  . B12 deficiency 07/11/2014  . Hypoglycemia associated with diabetes ([place]) 07/09/2014  . Insulin pump titration 07/09/2014  . LADA (latent autoimmune diabetes in adults), managed as type 1 ([place]) 07/09/2014    Orientation RESPIRATION BLADDER Height & Weight     Self, Time, Situation, Place  Normal Continent Weight: 152 lb (68.9 kg) Height:  5' 3.5" (161.3 cm)  BEHAVIORAL SYMPTOMS/MOOD NEUROLOGICAL BOWEL NUTRITION STATUS      Continent Diet(Diet: Carb Modified. )  AMBULATORY STATUS COMMUNICATION OF NEEDS Skin   Extensive Assist Verbally Surgical wounds(Incision: Left Knee. )                       Personal Care Assistance Level of Assistance  Bathing, Feeding, Dressing Bathing Assistance: Limited assistance Feeding assistance: Independent Dressing Assistance: Limited assistance     Functional Limitations Info  Sight, Hearing, Speech Sight Info: Adequate Hearing Info: Adequate Speech Info: Adequate    SPECIAL CARE  FACTORS FREQUENCY  PT (By licensed PT), OT (By licensed OT)     PT Frequency: (5) OT Frequency: (5)            Contractures      Additional Factors Info  Code Status, Allergies Code Status Info: (Full Code. ) Allergies Info: ( Neosporin Neomycin-bacitracin Zn-polymyx, Ceclor Cefaclor, Other, Polysorbate, Relafen Nabumetone, Sulfasalazine, Amoxicillin, Band- aid Plus Antibiotic Bacitracin-polymyxin B, Clindamycin, Niacin, Niaspan Niacin Er, Penicillin V Potassium, Penicillin)           Current Medications (02/03/2018):  This is the current hospital active medication list Current Facility-Administered Medications  Medication Dose Route Frequency Provider Last Rate Last Dose  . 0.9 %  sodium chloride infusion   Intravenous Continuous Hooten, Laurice Record, MD 100 mL/hr at 02/03/18 1325    . acetaminophen (OFIRMEV) IV 1,000 mg  1,000 mg Intravenous Q6H Hooten, Laurice Record, MD 400 mL/hr at 02/03/18 1612 1,000 mg at 02/03/18 1612  . [START ON 02/04/2018] acetaminophen (TYLENOL) tablet 325-650 mg  325-650 mg Oral Q6H PRN Hooten, Laurice Record, MD      . alum & mag hydroxide-simeth (MAALOX/MYLANTA) 200-200-20 MG/5ML suspension 30 mL  30 mL Oral Q4H PRN Hooten, Laurice Record, MD      . bisacodyl (DULCOLAX) suppository 10 mg  10 mg Rectal Daily PRN Hooten, Laurice Record, MD      . calcium-vitamin D 500-200 MG-UNIT per tablet 1 tablet  1 tablet Oral BID PC Hooten, Laurice Record, MD      . celecoxib (CELEBREX) capsule 200 mg  200 mg Oral BID Hooten, Laurice Record,  MD      . clindamycin (CLEOCIN) IVPB 600 mg  600 mg Intravenous Q6H Hooten, Laurice Record, MD   Stopped at 02/03/18 1413  . diphenhydrAMINE (BENADRYL) 12.5 MG/5ML elixir 12.5-25 mg  12.5-25 mg Oral Q4H PRN Hooten, Laurice Record, MD      . Derrill Memo ON 02/04/2018] enoxaparin (LOVENOX) injection 30 mg  30 mg Subcutaneous Q12H Hooten, Laurice Record, MD      . ferrous sulfate tablet 325 mg  325 mg Oral BID WC Hooten, Laurice Record, MD      . gabapentin (NEURONTIN) capsule 300 mg  300 mg Oral QHS Hooten,  Laurice Record, MD      . glucagon (human recombinant) (GLUCAGEN) injection 1 mg  1 mg Intramuscular Daily PRN Hooten, Laurice Record, MD      . glucose chewable tablet 16 g  16 g Oral TID PRN Hooten, Laurice Record, MD      . HYDROmorphone (DILAUDID) injection 0.5-1 mg  0.5-1 mg Intravenous Q4H PRN Hooten, Laurice Record, MD      . insulin pump   Subcutaneous Q4H Hooten, Laurice Record, MD   2 each at 02/03/18 1240  . [START ON 02/04/2018] levothyroxine (SYNTHROID, LEVOTHROID) tablet 100 mcg  100 mcg Oral QAC breakfast Hooten, Laurice Record, MD      . magnesium hydroxide (MILK OF MAGNESIA) suspension 30 mL  30 mL Oral Daily PRN Hooten, Laurice Record, MD      . menthol-cetylpyridinium (CEPACOL) lozenge 3 mg  1 lozenge Oral PRN Hooten, Laurice Record, MD       Or  . phenol (CHLORASEPTIC) mouth spray 1 spray  1 spray Mouth/Throat PRN Hooten, Laurice Record, MD      . metoCLOPramide (REGLAN) tablet 5-10 mg  5-10 mg Oral Q8H PRN Hooten, Laurice Record, MD       Or  . metoCLOPramide (REGLAN) injection 5-10 mg  5-10 mg Intravenous Q8H PRN Hooten, Laurice Record, MD      . metoCLOPramide (REGLAN) tablet 10 mg  10 mg Oral TID AC & HS Hooten, Laurice Record, MD      . ondansetron (ZOFRAN) tablet 4 mg  4 mg Oral Q6H PRN Hooten, Laurice Record, MD       Or  . ondansetron (ZOFRAN) injection 4 mg  4 mg Intravenous Q6H PRN Hooten, Laurice Record, MD      . oxyCODONE (Oxy IR/ROXICODONE) immediate release tablet 10 mg  10 mg Oral Q4H PRN Hooten, Laurice Record, MD      . oxyCODONE (Oxy IR/ROXICODONE) immediate release tablet 5 mg  5 mg Oral Q4H PRN Hooten, Laurice Record, MD   5 mg at 02/03/18 1321  . pantoprazole (PROTONIX) EC tablet 40 mg  40 mg Oral BID Hooten, Laurice Record, MD      . polyvinyl alcohol (LIQUIFILM TEARS) 1.4 % ophthalmic solution 1-2 drop  1-2 drop Both Eyes TID PRN Hooten, Laurice Record, MD      . rosuvastatin (CRESTOR) tablet 10 mg  10 mg Oral QHS Hooten, Laurice Record, MD      . senna-docusate (Senokot-S) tablet 1 tablet  1 tablet Oral BID Hooten, Laurice Record, MD      . sodium phosphate (FLEET) 7-19 GM/118ML enema 1  enema  1 enema Rectal Once PRN Hooten, Laurice Record, MD      . traMADol Veatrice Bourbon) tablet 50-100 mg  50-100 mg Oral Q4H PRN Hooten, Laurice Record, MD      . vitamin B-12 (CYANOCOBALAMIN) tablet 1,000 mcg  1,000  mcg Oral Q1200 Hooten, Laurice Record, MD         Discharge Medications: Please see discharge summary for a list of discharge medications.  Relevant Imaging Results:  Relevant Lab Results:   Additional Information (SSN: [id number])  Brinae Woods, Veronia Beets, LCSW

## 2018-02-03 NOTE — Op Note (Addendum)
OPERATIVE NOTE  DATE OF SURGERY:  02/03/2018  PATIENT NAME:  Elizabeth [name]   DOB: 1941/09/06  MRN: [id number]  PRE-OPERATIVE DIAGNOSIS: Degenerative arthrosis of the left knee, primary  POST-OPERATIVE DIAGNOSIS:  Same  PROCEDURE:  Left total knee arthroplasty using computer-assisted navigation  SURGEON:  Marciano Sequin. M.D.  ASSISTANT:  Reche Dixon, PA-C (present and scrubbed throughout the case, critical for assistance with exposure, retraction, instrumentation, and closure)  ANESTHESIA: spinal  ESTIMATED BLOOD LOSS: 100 mL  FLUIDS REPLACED: 1400 mL of crystalloid  TOURNIQUET TIME: 91 minutes  DRAINS: 2 medium Hemovac drains  SOFT TISSUE RELEASES: Anterior cruciate ligament, posterior cruciate ligament, deep medial collateral ligament, patellofemoral ligament  IMPLANTS UTILIZED: DePuy Attune size 5N posterior stabilized femoral component (cemented), size 5 rotating platform tibial component (cemented), 35 mm medialized dome patella (cemented), and a 5 mm stabilized rotating platform polyethylene insert.  INDICATIONS FOR SURGERY: Elizabeth [name] is a 77 y.o. year old female with a long history of progressive knee pain. X-rays demonstrated severe degenerative changes in tricompartmental fashion. The patient had not seen any significant improvement despite conservative nonsurgical intervention. After discussion of the risks and benefits of surgical intervention, the patient expressed understanding of the risks benefits and agree with plans for total knee arthroplasty.   The risks, benefits, and alternatives were discussed at length including but not limited to the risks of infection, bleeding, nerve injury, stiffness, blood clots, the need for revision surgery, cardiopulmonary complications, among others, and they were willing to proceed.  PROCEDURE IN DETAIL: The patient was brought into the operating room and, after adequate spinal anesthesia was achieved, a tourniquet was  placed on the patient's upper thigh. The patient's knee and leg were cleaned and prepped with alcohol and DuraPrep and draped in the usual sterile fashion. A "timeout" was performed as per usual protocol. The lower extremity was exsanguinated using an Esmarch, and the tourniquet was inflated to 300 mmHg. An anterior longitudinal incision was made followed by a standard mid vastus approach. The deep fibers of the medial collateral ligament were elevated in a subperiosteal fashion off of the medial flare of the tibia so as to maintain a continuous soft tissue sleeve. The patella was subluxed laterally and the patellofemoral ligament was incised. Inspection of the knee demonstrated severe degenerative changes with full-thickness loss of articular cartilage. Osteophytes were debrided using a rongeur. Anterior and posterior cruciate ligaments were excised. Two 4.0 mm Schanz pins were inserted in the femur and into the tibia for attachment of the array of trackers used for computer-assisted navigation. Hip center was identified using a circumduction technique. Distal landmarks were mapped using the computer. The distal femur and proximal tibia were mapped using the computer. The distal femoral cutting guide was positioned using computer-assisted navigation so as to achieve a 5 distal valgus cut. The femur was sized and it was felt that a size 5N femoral component was appropriate. A size 5 femoral cutting guide was positioned and the anterior cut was performed and verified using the computer. This was followed by completion of the posterior and chamfer cuts. Femoral cutting guide for the central box was then positioned in the center box cut was performed.  Attention was then directed to the proximal tibia. Medial and lateral menisci were excised. The extramedullary tibial cutting guide was positioned using computer-assisted navigation so as to achieve a 0 varus-valgus alignment and 3 posterior slope. The cut was  performed and verified using the computer. The proximal tibia  was sized and it was felt that a size 5 tibial tray was appropriate. Tibial and femoral trials were inserted followed by insertion of a 5 mm polyethylene insert. This allowed for excellent mediolateral soft tissue balancing both in flexion and in full extension. Finally, the patella was cut and prepared so as to accommodate a 35 mm medialized dome patella. A patella trial was placed and the knee was placed through a range of motion with excellent patellar tracking appreciated. The femoral trial was removed after debridement of posterior osteophytes. The central post-hole for the tibial component was reamed followed by insertion of a keel punch. Tibial trials were then removed. Cut surfaces of bone were irrigated with copious amounts of normal saline with antibiotic solution using pulsatile lavage and then suctioned dry. Polymethylmethacrylate cement with gentamicin was prepared in the usual fashion using a vacuum mixer. Cement was applied to the cut surface of the proximal tibia as well as along the undersurface of a size 5 rotating platform tibial component. Tibial component was positioned and impacted into place. Excess cement was removed using [employer] fast streamer. Cement was then applied to the cut surfaces of the femur as well as along the posterior flanges of the size 5N femoral component. The femoral component was positioned and impacted into place. Excess cement was removed using [employer] fast streamer. A 5 mm polyethylene trial was inserted and the knee was brought into full extension with steady axial compression applied. Finally, cement was applied to the backside of a 35 mm medialized dome patella and the patellar component was positioned and patellar clamp applied. Excess cement was removed using [employer] fast streamer. After adequate curing of the cement, the tourniquet was deflated after a total tourniquet time of 91 minutes. Hemostasis was achieved using  electrocautery. The knee was irrigated with copious amounts of normal saline with antibiotic solution using pulsatile lavage and then suctioned dry. 20 mL of 1.3% Exparel and 60 mL of 0.25% Marcaine in 40 mL of normal saline was injected along the posterior capsule, medial and lateral gutters, and along the arthrotomy site. A 5 mm stabilized rotating platform polyethylene insert was inserted and the knee was placed through a range of motion with excellent mediolateral soft tissue balancing appreciated and excellent patellar tracking noted. 2 medium drains were placed in the wound bed and brought out through separate stab incisions. The medial parapatellar portion of the incision was reapproximated using interrupted sutures of #1 Vicryl. Subcutaneous tissue was approximated in layers using first #0 Vicryl followed #2-0 Vicryl. The skin was approximated with skin staples. A sterile dressing was applied.  The patient tolerated the procedure well and was transported to the recovery room in stable condition.    James P. Holley Bouche., M.D.

## 2018-02-03 NOTE — Progress Notes (Addendum)
Inpatient Diabetes Program Recommendations  AACE/ADA: New Consensus Statement on Inpatient Glycemic Control (2015)  Target Ranges:  Prepandial:   less than 140 mg/dL      Peak postprandial:   less than 180 mg/dL (1-2 hours)      Critically ill patients:  140 - 180 mg/dL   Lab Results  Component Value Date   GLUCAP 289 (H) 02/03/2018   HGBA1C 6.5 (H) 01/20/2018    Review of Glycemic ControlResults for Elizabeth, [name] (MRN [id number]) as of 02/03/2018 08:59  Ref. Range 02/03/2018 06:14 02/03/2018 08:51  Glucose-Capillary Latest Ref Range: 65 - 99 mg/dL 207 (H) 289 (H)    Diabetes history: LADA-Type 1 DM Outpatient Diabetes medications:  Insulin pump:  Pump Settings: Basal Rates:  Time Dose (u/hr)  0:00 0.475  05;30 0.575  24hr Basal: 13.25u  Bolus Settings:  I:C 0:00 1:9 9:00 1:10  SF 0:00 52 9:00 55  TBG 110-120  Active time 4 hours  Current orders for Inpatient glycemic control:  Insulin pump in OR  Inpatient Diabetes Program Recommendations:    Please check blood sugars hourly while in surgery. Will follow.  Thanks,  Adah Perl, RN, BC-ADM Inpatient Diabetes Coordinator Pager [phone] (8a-5p)  Addendum 10:28a- Spoke with RN in [place].  She states that patient is wearing insulin pump and that last CBG was 289 mg/dL.  Patient is getting ready to move to PACU.  Will follow.

## 2018-02-03 NOTE — Transfer of Care (Signed)
Immediate Anesthesia Transfer of Care Note  Patient: Elizabeth [name]  Procedure(s) Performed: COMPUTER ASSISTED TOTAL KNEE ARTHROPLASTY (Left )  Patient Location: PACU  Anesthesia Type:Spinal  Level of Consciousness: awake, alert  and oriented  Airway & Oxygen Therapy: Patient Spontanous Breathing and Patient connected to nasal cannula oxygen  Post-op Assessment: Report given to RN and Post -op Vital signs reviewed and stable  Post vital signs: Reviewed and stable  Last Vitals:  Vitals:   02/03/18 0612 02/03/18 1045  BP: (!) 161/71 (!) 116/57  Pulse: 82 88  Resp: 12 15  Temp: (!) 36.4 C 37 C  SpO2: 97% 97%    Last Pain:  Vitals:   02/03/18 0612  TempSrc: Oral  PainSc: 8          Complications: No apparent anesthesia complications

## 2018-02-03 NOTE — Progress Notes (Signed)
Pt administered 1.2 units novolog for blood sugar of 245

## 2018-02-04 ENCOUNTER — Encounter: Payer: Self-pay | Admitting: Orthopedic Surgery

## 2018-02-04 LAB — GLUCOSE, CAPILLARY
GLUCOSE-CAPILLARY: 147 mg/dL — AB (ref 65–99)
GLUCOSE-CAPILLARY: 157 mg/dL — AB (ref 65–99)
Glucose-Capillary: 157 mg/dL — ABNORMAL HIGH (ref 65–99)

## 2018-02-04 LAB — BASIC METABOLIC PANEL
ANION GAP: 6 (ref 5–15)
BUN: 22 mg/dL — ABNORMAL HIGH (ref 6–20)
CHLORIDE: 102 mmol/L (ref 101–111)
CO2: 25 mmol/L (ref 22–32)
Calcium: 8.4 mg/dL — ABNORMAL LOW (ref 8.9–10.3)
Creatinine, Ser: 1 mg/dL (ref 0.44–1.00)
GFR calc non Af Amer: 53 mL/min — ABNORMAL LOW (ref >60)
Glucose, Bld: 200 mg/dL — ABNORMAL HIGH (ref 65–99)
POTASSIUM: 4.7 mmol/L (ref 3.5–5.1)
SODIUM: 133 mmol/L — AB (ref 135–145)

## 2018-02-04 LAB — CBC
HCT: 32.6 % — ABNORMAL LOW (ref 35.0–47.0)
HEMOGLOBIN: 10.8 g/dL — AB (ref 12.0–16.0)
MCH: 30.1 pg (ref 26.0–34.0)
MCHC: 33.2 g/dL (ref 32.0–36.0)
MCV: 90.6 fL (ref 80.0–100.0)
Platelets: 182 10*3/uL (ref 150–440)
RBC: 3.6 MIL/uL — AB (ref 3.80–5.20)
RDW: 13.5 % (ref 11.5–14.5)
WBC: 10 10*3/uL (ref 3.6–11.0)

## 2018-02-04 MED ORDER — METAXALONE 800 MG PO TABS
800.0000 mg | ORAL_TABLET | Freq: Three times a day (TID) | ORAL | Status: DC | PRN
  Administered 2018-02-04: 800 mg via ORAL
  Filled 2018-02-04 (×2): qty 1

## 2018-02-04 NOTE — Anesthesia Postprocedure Evaluation (Signed)
Anesthesia Post Note  Patient: Elizabeth [name]  Procedure(s) Performed: COMPUTER ASSISTED TOTAL KNEE ARTHROPLASTY (Left )  Patient location during evaluation: Other Anesthesia Type: Spinal Level of consciousness: awake and alert Pain management: pain level controlled Vital Signs Assessment: post-procedure vital signs reviewed and stable Respiratory status: spontaneous breathing and respiratory function stable Cardiovascular status: blood pressure returned to baseline and stable Postop Assessment: spinal receding Anesthetic complications: no     Last Vitals:  Vitals Value Taken Time  BP    Temp    Pulse    Resp    SpO2      Last Pain:  Vitals:   02/04/18 0824  TempSrc:   PainSc: 0-No pain                 Alison Stalling

## 2018-02-04 NOTE — Progress Notes (Signed)
Clinical Social Worker (CSW) received SNF consult. PT is recommending home health. RN case manager aware of above. Please reconsult if future social work needs arise. CSW signing off.   Lashay Osborne, LCSW [phone] 

## 2018-02-04 NOTE — Progress Notes (Signed)
Pt complains of muscle spasms. MD notified, order received

## 2018-02-04 NOTE — Progress Notes (Signed)
Physical Therapy Treatment Patient Details Name: Elizabeth [name] MRN: [id number] DOB: July 13, 1941 Today's Date: 02/04/2018    History of Present Illness Pt underwent L TKR without reported post-op complications. PMH includes DMI, asthma    PT Comments    Pt does well with therapy at this time. Pt is independent for bed mobility and CGA only for transfers. She is able to complete a full lap around RN station. Cues provided for proper sequencing with walker. Instructed to increase step length bilaterally. VSS during ambulation. Pt able to complete supine and seated exercises as instructed. AAROM is progressing very well at -2 to 106 degrees AAROM. Pt will benefit from PT services to address deficits in strength, balance, and mobility in order to return to full function at home.    Follow Up Recommendations  Home health PT     Equipment Recommendations  None recommended by PT    Recommendations for Other Services       Precautions / Restrictions Precautions Precautions: Fall;Knee Precaution Booklet Issued: Yes (comment) Restrictions Weight Bearing Restrictions: Yes    Mobility  Bed Mobility Overal bed mobility: Independent             General bed mobility comments: HOB elevated, use of bed rails, increased time. Improving from prior sessions  Transfers Overall transfer level: Needs assistance Equipment used: Rolling walker (2 wheeled) Transfers: Sit to/from Stand Sit to Stand: Min guard         General transfer comment: Safe hand placement demonstrated. Good sequencing. Stable in standing with UE support on walker  Ambulation/Gait Ambulation/Gait assistance: Min guard Ambulation Distance (Feet): 220 Feet Assistive device: Rolling walker (2 wheeled)     Gait velocity interpretation: Below normal speed for age/gender General Gait Details: Pt is able to complete a full lap around RN station. Cues provided for proper sequencing with walker. Instructed to increase  step length bilaterally. VSS during ambulation   Stairs            Wheelchair Mobility    Modified Rankin (Stroke Patients Only)       Balance Overall balance assessment: Needs assistance Sitting-balance support: No upper extremity supported Sitting balance-Leahy Scale: Good     Standing balance support: No upper extremity supported Standing balance-Leahy Scale: Fair Standing balance comment: Pt able to stand and stabilize without UE support                            Cognition Arousal/Alertness: Awake/alert Behavior During Therapy: WFL for tasks assessed/performed Overall Cognitive Status: Within Functional Limits for tasks assessed                                        Exercises Total Joint Exercises Ankle Circles/Pumps: Both;15 reps Quad Sets: Both;15 reps Gluteal Sets: Both;15 reps Towel Squeeze: Both;15 reps Heel Slides: Left;15 reps Hip ABduction/ADduction: Left;15 reps Straight Leg Raises: Left;15 reps Long Arc Quad: Left;15 reps Knee Flexion: Left;15 reps Goniometric ROM: -2 to 106 degrees AAROM, pain limited in flexion    General Comments        Pertinent Vitals/Pain Pain Assessment: No/denies pain Pain Location: Denies L knee pain Pain Descriptors / Indicators: Aching Pain Intervention(s): Monitored during session    Home Living  Prior Function            PT Goals (current goals can now be found in the care plan section) Acute Rehab PT Goals Patient Stated Goal: Return to prior level of function at home with less pain PT Goal Formulation: With patient/family Time For Goal Achievement: 02/17/18 Potential to Achieve Goals: Good Progress towards PT goals: Progressing toward goals    Frequency    BID      PT Plan Current plan remains appropriate    Co-evaluation              AM-PAC PT "6 Clicks" Daily Activity  Outcome Measure  Difficulty turning over in bed  (including adjusting bedclothes, sheets and blankets)?: None Difficulty moving from lying on back to sitting on the side of the bed? : None Difficulty sitting down on and standing up from a chair with arms (e.g., wheelchair, bedside commode, etc,.)?: A Little Help needed moving to and from a bed to chair (including a wheelchair)?: None Help needed walking in hospital room?: A Little Help needed climbing 3-5 steps with a railing? : A Little 6 Click Score: 21    End of Session Equipment Utilized During Treatment: Gait belt Activity Tolerance: Patient tolerated treatment well Patient left: with nursing/sitter in room(Pt left in bathroom with CNA) Nurse Communication: Mobility status PT Visit Diagnosis: Other abnormalities of gait and mobility (R26.89);Muscle weakness (generalized) (M62.81);Difficulty in walking, not elsewhere classified (R26.2);Pain Pain - Right/Left: Left Pain - part of body: Knee     Time: 9892-1194 PT Time Calculation (min) (ACUTE ONLY): 28 min  Charges:  $Gait Training: 8-22 mins $Therapeutic Exercise: 8-22 mins                    G Codes:      Lyndel Safe Nashika Coker PT, DPT    Adelina Collard 02/04/2018, 10:41 AM

## 2018-02-04 NOTE — Care Management Note (Signed)
Case Management Note  Patient Details  Name: Elizabeth [name] MRN: [id number] Date of Birth: 05-Mar-1941  Subjective/Objective:                  RNCM met with patient to discuss transition of care. She would like to return home with her husband followed by [facility].  She has a rolling walker and a shower chair available for use at home. She uses [facility] pharmacy for medications- [phone].  Action/Plan: Referral made to [facility]. Lovenox 66m injection daily with no refills called in to [facility] per Dr. HMarry Guan RNCM will follow.   Expected Discharge Date:                  Expected Discharge Plan:     In-House Referral:     Discharge planning Services  CM Consult  Post Acute Care Choice:  Home Health Choice offered to:  Patient  DME Arranged:    DME Agency:     HH Arranged[facility]  [facility]  [facility]  Status of Service:  In process, will continue to follow  If discussed at Long Length of Stay Meetings, dates discussed:    Additional Comments:  AMarshell Garfinkel RN 02/04/2018, 11:24 AM

## 2018-02-04 NOTE — Progress Notes (Signed)
Pt is progressing well. No complaints. Pain is well controlled and pt is tolerating activity. Surgical site intact. Will continue to monitor.

## 2018-02-04 NOTE — Progress Notes (Signed)
  Subjective: 1 Day Post-Op Procedure(s) (LRB): COMPUTER ASSISTED TOTAL KNEE ARTHROPLASTY (Left) Patient reports pain as mild.   Patient seen in rounds with Dr. Marry Guan. Patient is well, and has had no acute complaints or problems Plan is to go Home after hospital stay. Negative for chest pain and shortness of breath Fever: no Gastrointestinal: Negative for nausea and vomiting  Objective: Vital signs in last 24 hours: Temp:  [97.5 F (36.4 C)-98.6 F (37 C)] 97.5 F (36.4 C) (03/21 0346) Pulse Rate:  [59-91] 59 (03/21 0346) Resp:  [11-18] 16 (03/21 0346) BP: (113-136)/(44-76) 114/44 (03/21 0346) SpO2:  [95 %-100 %] 100 % (03/21 0346)  Intake/Output from previous day:  Intake/Output Summary (Last 24 hours) at 02/04/2018 0622 Last data filed at 02/04/2018 0353 Gross per 24 hour  Intake 2210 ml  Output 2990 ml  Net -780 ml    Intake/Output this shift: Total I/O In: -  Out: 920 [Urine:850; Drains:70]  Labs: No results for input(s): HGB in the last 72 hours. No results for input(s): WBC, RBC, HCT, PLT in the last 72 hours. No results for input(s): NA, K, CL, CO2, BUN, CREATININE, GLUCOSE, CALCIUM in the last 72 hours. No results for input(s): LABPT, INR in the last 72 hours.   EXAM General - Patient is Alert and Oriented Extremity - Neurovascular intact Dorsiflexion/Plantar flexion intact No cellulitis present Compartment soft Dressing/Incision - clean, dry, no drainage Motor Function - intact, moving foot and toes well on exam.  Able to do a straight leg raise independently.  Past Medical History:  Diagnosis Date  . Anemia   . Arthritis   . Asthma    with cough/cold but nothing now  . Chronic kidney disease    due to type 1 diabetes  . Diabetes mellitus without complication ([place]) 5631   on insulin pump. VERY BRITTLE. per patient, it can drop 140 points in an hour  . GERD (gastroesophageal reflux disease)    H/O  . Heart murmur    not followed by anyone  . Hx  of cervical spine surgery   . Hyperlipidemia   . Hypothyroidism   . Insulin pump in place   . PONV (postoperative nausea and vomiting)    NO PROBLEMS SINCE USING ZOFRAN INTRAOP    Assessment/Plan: 1 Day Post-Op Procedure(s) (LRB): COMPUTER ASSISTED TOTAL KNEE ARTHROPLASTY (Left) Active Problems:   S/P total knee arthroplasty  Estimated body mass index is 26.5 kg/m as calculated from the following:   Height as of this encounter: 5' 3.5" (1.613 m).   Weight as of this encounter: 68.9 kg (152 lb). Advance diet Up with therapy D/C IV fluids Plan for discharge tomorrow  DVT Prophylaxis - Lovenox, Foot Pumps and TED hose Weight-Bearing as tolerated to left leg  Reche Dixon, PA-C Orthopaedic Surgery 02/04/2018, 6:22 AM

## 2018-02-04 NOTE — Progress Notes (Signed)
Physical Therapy Treatment Patient Details Name: Elizabeth [name] MRN: [id number] DOB: [birth year] Today's Date: 02/04/2018    History of Present Illness Pt underwent L TKR without reported post-op complications. PMH includes DMI, asthma    PT Comments    Pt continues to make excellent progress with therapy this afternoon. Pt continues to demonstrate improved speed, stability, and strength with transfers. She completes 2 laps around RN station and is able to ambulate into rehab gym. VSS during ambulation and pt denies DOE. Cues for upright posture and to increase step length. Gait speed is functional for limited community mobility. Good stability with UE support on rolling walker. Pt educated about proper sequencing with stairs. She demonstrates good strength and stability on stairs without safety concerns. Pt able to complete seated exercises as instructed. She has met all PT goals for discharge at this time. Pt will benefit from PT services to address deficits in strength, balance, and mobility in order to return to full function at home.    Follow Up Recommendations  Home health PT     Equipment Recommendations  None recommended by PT    Recommendations for Other Services       Precautions / Restrictions Precautions Precautions: Fall;Knee Precaution Booklet Issued: Yes (comment) Restrictions Weight Bearing Restrictions: Yes    Mobility  Bed Mobility               General bed mobility comments: Received and left upright in recliner  Transfers Overall transfer level: Needs assistance Equipment used: Rolling walker (2 wheeled) Transfers: Sit to/from Stand Sit to Stand: Min guard         General transfer comment: Pt continues to demonstrate improved speed, stability, and strength with transfers  Ambulation/Gait Ambulation/Gait assistance: Min guard Ambulation Distance (Feet): 375 Feet Assistive device: Rolling walker (2 wheeled)       General Gait Details:  Pt completes 2 laps around RN station and is able to ambulate into rehab gym. VSS during ambulation and pt denies DOE. Cues for upright posture and to increase step length. Gait speed is functional for limited community mobility. Good stability with UE support on rolling walker.    Stairs Stairs: Yes   Stair Management: Step to pattern;Two rails Number of Stairs: 4 General stair comments: Pt educated about proper sequencing with stairs. She demonstrates good strength and stability on stairs without safety concerns  Wheelchair Mobility    Modified Rankin (Stroke Patients Only)       Balance Overall balance assessment: Needs assistance Sitting-balance support: No upper extremity supported Sitting balance-Leahy Scale: Good     Standing balance support: No upper extremity supported Standing balance-Leahy Scale: Fair Standing balance comment: Pt able to stand and stabilize without UE support                            Cognition Arousal/Alertness: Awake/alert Behavior During Therapy: WFL for tasks assessed/performed Overall Cognitive Status: Within Functional Limits for tasks assessed                                        Exercises Total Joint Exercises Hip ABduction/ADduction: Left;15 reps;Other (comment) Long Arc Quad: Left;15 reps Knee Flexion: Left;15 reps Marching in Standing: Both;Seated;15 reps(also in standing x 15) Other Exercises Other Exercises: Mini squats x 10    General Comments  Pertinent Vitals/Pain Pain Assessment: No/denies pain Pain Location: Denies L knee pain Pain Descriptors / Indicators: Aching Pain Intervention(s): Monitored during session    Home Living                      Prior Function            PT Goals (current goals can now be found in the care plan section) Acute Rehab PT Goals Patient Stated Goal: Return to prior level of function at home with less pain PT Goal Formulation: With  patient/family Time For Goal Achievement: 02/17/18 Potential to Achieve Goals: Good Progress towards PT goals: Progressing toward goals    Frequency    BID      PT Plan Current plan remains appropriate    Co-evaluation              AM-PAC PT "6 Clicks" Daily Activity  Outcome Measure  Difficulty turning over in bed (including adjusting bedclothes, sheets and blankets)?: None Difficulty moving from lying on back to sitting on the side of the bed? : None Difficulty sitting down on and standing up from a chair with arms (e.g., wheelchair, bedside commode, etc,.)?: None Help needed moving to and from a bed to chair (including a wheelchair)?: None Help needed walking in hospital room?: A Little Help needed climbing 3-5 steps with a railing? : A Little 6 Click Score: 22    End of Session Equipment Utilized During Treatment: Gait belt Activity Tolerance: Patient tolerated treatment well Patient left: in chair;with call bell/phone within reach;with chair alarm set;with SCD's reapplied;Other (comment)(towel roll under heel, polar care in place)   PT Visit Diagnosis: Other abnormalities of gait and mobility (R26.89);Muscle weakness (generalized) (M62.81);Difficulty in walking, not elsewhere classified (R26.2);Pain Pain - Right/Left: Left Pain - part of body: Knee     Time: 3833-3832 PT Time Calculation (min) (ACUTE ONLY): 24 min  Charges:  $Gait Training: 8-22 mins $Therapeutic Exercise: 8-22 mins                    G Codes:       Lyndel Safe Huprich PT, DPT     Huprich,Jason 02/04/2018, 3:58 PM

## 2018-02-05 LAB — CBC
HEMATOCRIT: 30.9 % — AB (ref 35.0–47.0)
HEMOGLOBIN: 10.5 g/dL — AB (ref 12.0–16.0)
MCH: 30.7 pg (ref 26.0–34.0)
MCHC: 34.1 g/dL (ref 32.0–36.0)
MCV: 90 fL (ref 80.0–100.0)
Platelets: 165 10*3/uL (ref 150–440)
RBC: 3.43 MIL/uL — AB (ref 3.80–5.20)
RDW: 14 % (ref 11.5–14.5)
WBC: 7.2 10*3/uL (ref 3.6–11.0)

## 2018-02-05 LAB — BASIC METABOLIC PANEL
Anion gap: 7 (ref 5–15)
BUN: 20 mg/dL (ref 6–20)
CHLORIDE: 102 mmol/L (ref 101–111)
CO2: 26 mmol/L (ref 22–32)
Calcium: 8.5 mg/dL — ABNORMAL LOW (ref 8.9–10.3)
Creatinine, Ser: 0.92 mg/dL (ref 0.44–1.00)
GFR calc Af Amer: >60 mL/min (ref >60)
GFR calc non Af Amer: 58 mL/min — ABNORMAL LOW (ref >60)
GLUCOSE: 165 mg/dL — AB (ref 65–99)
POTASSIUM: 4.7 mmol/L (ref 3.5–5.1)
Sodium: 135 mmol/L (ref 135–145)

## 2018-02-05 LAB — GLUCOSE, CAPILLARY: Glucose-Capillary: 138 mg/dL — ABNORMAL HIGH (ref 65–99)

## 2018-02-05 MED ORDER — ENOXAPARIN SODIUM 40 MG/0.4ML ~~LOC~~ SOLN: 40.0000 mg | SUBCUTANEOUS | 0 refills | Status: DC

## 2018-02-05 MED ORDER — OXYCODONE HCL 5 MG PO TABS: 5.0000 mg | ORAL_TABLET | ORAL | 0 refills | Status: DC | PRN

## 2018-02-05 MED ORDER — CELECOXIB 200 MG PO CAPS: 200.0000 mg | ORAL_CAPSULE | Freq: Two times a day (BID) | ORAL | 1 refills | Status: DC

## 2018-02-05 MED ORDER — TRAMADOL HCL 50 MG PO TABS: 50.0000 mg | ORAL_TABLET | ORAL | 1 refills | Status: DC | PRN

## 2018-02-05 NOTE — Progress Notes (Signed)
  Subjective: 2 Days Post-Op Procedure(s) (LRB): COMPUTER ASSISTED TOTAL KNEE ARTHROPLASTY (Left) Patient reports pain as mild.   Patient seen in rounds with Dr. Marry Guan. Patient is well, and has had no acute complaints or problems Plan is to go Home after hospital stay. Negative for chest pain and shortness of breath Fever: no Gastrointestinal: Negative for nausea and vomiting  Objective: Vital signs in last 24 hours: Temp:  [97.6 F (36.4 C)-97.8 F (36.6 C)] 97.8 F (36.6 C) (03/22 0421) Pulse Rate:  [53-64] 64 (03/22 0421) Resp:  [18-19] 19 (03/22 0421) BP: (127-142)/(51-61) 135/61 (03/22 0421) SpO2:  [98 %-100 %] 99 % (03/22 0421)  Intake/Output from previous day:  Intake/Output Summary (Last 24 hours) at 02/05/2018 0710 Last data filed at 02/05/2018 0427 Gross per 24 hour  Intake 3038.33 ml  Output 150 ml  Net 2888.33 ml    Intake/Output this shift: No intake/output data recorded.  Labs: Recent Labs    02/04/18 0510 02/05/18 0542  HGB 10.8* 10.5*   Recent Labs    02/04/18 0510 02/05/18 0542  WBC 10.0 7.2  RBC 3.60* 3.43*  HCT 32.6* 30.9*  PLT 182 165   Recent Labs    02/04/18 0510 02/05/18 0542  NA 133* 135  K 4.7 4.7  CL 102 102  CO2 25 26  BUN 22* 20  CREATININE 1.00 0.92  GLUCOSE 200* 165*  CALCIUM 8.4* 8.5*   No results for input(s): LABPT, INR in the last 72 hours.   EXAM General - Patient is Alert and Oriented Extremity - Neurovascular intact Dorsiflexion/Plantar flexion intact No cellulitis present Compartment soft Dressing/Incision - clean, dry, no drainage with Hemovac removed. Motor Function - intact, moving foot and toes well on exam.  Able to do a straight leg raise independently.  Past Medical History:  Diagnosis Date  . Anemia   . Arthritis   . Asthma    with cough/cold but nothing now  . Chronic kidney disease    due to type 1 diabetes  . Diabetes mellitus without complication ([place]) 6144   on insulin pump. VERY  BRITTLE. per patient, it can drop 140 points in an hour  . GERD (gastroesophageal reflux disease)    H/O  . Heart murmur    not followed by anyone  . Hx of cervical spine surgery   . Hyperlipidemia   . Hypothyroidism   . Insulin pump in place   . PONV (postoperative nausea and vomiting)    NO PROBLEMS SINCE USING ZOFRAN INTRAOP    Assessment/Plan: 2 Days Post-Op Procedure(s) (LRB): COMPUTER ASSISTED TOTAL KNEE ARTHROPLASTY (Left) Active Problems:   S/P total knee arthroplasty  Estimated body mass index is 26.5 kg/m as calculated from the following:   Height as of this encounter: 5' 3.5" (1.613 m).   Weight as of this encounter: 68.9 kg (152 lb). Needs a bowel movement. Discharge home with home health physical therapy.  DVT Prophylaxis - Lovenox, Foot Pumps and TED hose Weight-Bearing as tolerated to left leg  Reche Dixon, PA-C Orthopaedic Surgery 02/05/2018, 7:10 AM

## 2018-02-05 NOTE — Plan of Care (Signed)
  Problem: Education: Goal: Knowledge of General Education information will improve Outcome: Adequate for Discharge   Problem: Health Behavior/Discharge Planning: Goal: Ability to manage health-related needs will improve Outcome: Adequate for Discharge   Problem: Clinical Measurements: Goal: Ability to maintain clinical measurements within normal limits will improve Outcome: Adequate for Discharge Goal: Will remain free from infection Outcome: Adequate for Discharge Goal: Diagnostic test results will improve Outcome: Adequate for Discharge Goal: Respiratory complications will improve Outcome: Adequate for Discharge Goal: Cardiovascular complication will be avoided Outcome: Adequate for Discharge   Problem: Activity: Goal: Risk for activity intolerance will decrease Outcome: Adequate for Discharge   Problem: Nutrition: Goal: Adequate nutrition will be maintained Outcome: Adequate for Discharge   Problem: Coping: Goal: Level of anxiety will decrease Outcome: Adequate for Discharge   Problem: Elimination: Goal: Will not experience complications related to bowel motility Outcome: Adequate for Discharge Goal: Will not experience complications related to urinary retention Outcome: Adequate for Discharge   Problem: Pain Managment: Goal: General experience of comfort will improve Outcome: Adequate for Discharge   Problem: Safety: Goal: Ability to remain free from injury will improve Outcome: Adequate for Discharge   Problem: Skin Integrity: Goal: Risk for impaired skin integrity will decrease Outcome: Adequate for Discharge   Problem: Education: Goal: Knowledge of the prescribed therapeutic regimen will improve Outcome: Adequate for Discharge   Problem: Activity: Goal: Ability to avoid complications of mobility impairment will improve Outcome: Adequate for Discharge Goal: Range of joint motion will improve Outcome: Adequate for Discharge   Problem: Clinical  Measurements: Goal: Postoperative complications will be avoided or minimized Outcome: Adequate for Discharge   Problem: Pain Management: Goal: Pain level will decrease with appropriate interventions Outcome: Adequate for Discharge   Problem: Skin Integrity: Goal: Signs of wound healing will improve Outcome: Adequate for Discharge

## 2018-02-05 NOTE — Discharge Summary (Signed)
Physician Discharge Summary  Subjective: 2 Days Post-Op Procedure(s) (LRB): COMPUTER ASSISTED TOTAL KNEE ARTHROPLASTY (Left) Patient reports pain as mild.   Patient seen in rounds with Dr. Marry Guan. Patient is well, and has had no acute complaints or problems Patient is ready to go home with home health physical therapy.  Physician Discharge Summary  Patient ID: Elizabeth [name] MRN: [id number] DOB/AGE: 04/22/1941 77 y.o.  Admit date: 02/03/2018 Discharge date: 02/05/2018  Admission Diagnoses:  Discharge Diagnoses:  Active Problems:   S/P total knee arthroplasty   Discharged Condition: good  Hospital Course: The patient is postop day 2 from a left total knee replacement.  She has done very well since surgery.  She ambulated 375 feet yesterday.  Patient has normal vitals with stable labs.  The patient is ready to go home.  Treatments: surgery:   Left total knee arthroplasty using computer-assisted navigation  SURGEON:  Marciano Sequin. M.D.  ASSISTANT:  Vance Peper, PA (present and scrubbed throughout the case, critical for assistance with exposure, retraction, instrumentation, and closure)  ANESTHESIA: spinal  ESTIMATED BLOOD LOSS: 100 mL  FLUIDS REPLACED: 1400 mL of crystalloid  TOURNIQUET TIME: 91 minutes  DRAINS: 2 medium Hemovac drains  SOFT TISSUE RELEASES: Anterior cruciate ligament, posterior cruciate ligament, deep medial collateral ligament, patellofemoral ligament  IMPLANTS UTILIZED: DePuy Attune size 5N posterior stabilized femoral component (cemented), size 5 rotating platform tibial component (cemented), 35 mm medialized dome patella (cemented), and a 5 mm stabilized rotating platform polyethylene insert.     Discharge Exam: Blood pressure 135/61, pulse 64, temperature 97.8 F (36.6 C), temperature source Oral, resp. rate 19, height 5' 3.5" (1.613 m), weight 68.9 kg (152 lb), SpO2 99 %.   Disposition: Discharge disposition: 01-Home or Self  Care        Allergies as of 02/05/2018      Reactions   Neosporin [neomycin-bacitracin Zn-polymyx] Other (See Comments)   Skin rash or redness   Ceclor [cefaclor] Other (See Comments)   Unsure of reaction type   Other    Other reaction(s): Other (See Comments) sulfasaline-Does not remember   Polysorbate Other (See Comments)   Relafen [nabumetone] Other (See Comments)   Unsure of reaction type   Sulfasalazine Other (See Comments)   Unsure of reaction type   Amoxicillin Nausea And Vomiting   Has patient had a PCN reaction causing immediate rash, facial/tongue/throat swelling, SOB or lightheadedness with hypotension: No Has patient had a PCN reaction causing severe rash involving mucus membranes or skin necrosis:No Has patient had a PCN reaction that required hospitalization: No Has patient had a PCN reaction occurring within the last 10 years: No If all of the above answers are "NO", then may proceed with Cephalosporin use.   Band-aid Plus Antibiotic [bacitracin-polymyxin B] Other (See Comments)   ADHESIVE, NOT ANTIBIOTIC Can use paper tape PULLS SKIN OFF Other reaction(s): Other (See Comments) PULLS SKIN OFF PULLS SKIN OFF   Clindamycin Rash   Niacin    Other reaction(s): Other (See Comments) flushing   Niaspan [niacin Er] Other (See Comments)   FLUSHING   Penicillin V Potassium Other (See Comments)   Has patient had a PCN reaction causing immediate rash, facial/tongue/throat swelling, SOB or lightheadedness with hypotension: Unknown Has patient had a PCN reaction causing severe rash involving mucus membranes or skin necrosis: Unknown Has patient had a PCN reaction that required hospitalization: No Has patient had a PCN reaction occurring within the last 10 years: No If all of the above  answers are "NO", then may proceed with Cephalosporin use.   Penicillins Nausea And Vomiting   Happened so long ago and patient is not sure of reaction   Statins Other (See Comments)    Other reaction(s): Other (See Comments) arthralgia ARTHRALGIA arthralgia      Medication List    STOP taking these medications   ALEVE 220 MG Caps Generic drug:  Naproxen Sodium   HYDROcodone-acetaminophen 5-325 MG tablet Commonly known as:  NORCO     TAKE these medications   Biotin 10000 MCG Tabs Take 10,000 mcg by mouth daily at 12 noon.   CALCIUM 600+D3 PO Take 2 tablets by mouth daily.   celecoxib 200 MG capsule Commonly known as:  CELEBREX Take 1 capsule (200 mg total) by mouth 2 (two) times daily.   diphenhydrAMINE 25 MG tablet Commonly known as:  SOMINEX Take 25 mg by mouth at bedtime.   enoxaparin 40 MG/0.4ML injection Commonly known as:  LOVENOX Inject 0.4 mLs (40 mg total) into the skin daily.   glucagon 1 MG injection Inject 1 mg into the muscle daily as needed (for low blood sugars). Reported on 02/21/2016   glucose 5 g chewable tablet Chew 15 g by mouth 3 (three) times daily as needed for low blood sugar.   hydroxypropyl methylcellulose / hypromellose 2.5 % ophthalmic solution Commonly known as:  ISOPTO TEARS / GONIOVISC Place 1-2 drops into both eyes 3 (three) times daily as needed for dry eyes.   insulin pump Soln Inject into the skin. NOVOLOG 100 UNIT/ML injection   levothyroxine 100 MCG tablet Commonly known as:  SYNTHROID, LEVOTHROID Take 100 mcg by mouth daily before breakfast.   Melatonin 10 MG Tabs Take 10 mg by mouth at bedtime.   oxyCODONE 5 MG immediate release tablet Commonly known as:  Oxy IR/ROXICODONE Take 1 tablet (5 mg total) by mouth every 4 (four) hours as needed for moderate pain (pain score 4-6).   promethazine 25 MG tablet Commonly known as:  PHENERGAN Take 1 tablet (25 mg total) by mouth every 8 (eight) hours as needed.   pseudoephedrine-guaifenesin 60-600 MG 12 hr tablet Commonly known as:  MUCINEX D Take 1 tablet by mouth 2 (two) times daily as needed for congestion.   rosuvastatin 10 MG tablet Commonly known as:   CRESTOR Take 10 mg by mouth at bedtime.   traMADol 50 MG tablet Commonly known as:  ULTRAM Take 1-2 tablets (50-100 mg total) by mouth every 4 (four) hours as needed for moderate pain.   vitamin B-12 1000 MCG tablet Commonly known as:  CYANOCOBALAMIN Take 1,000 mcg by mouth daily at 12 noon.            Durable Medical Equipment  (From admission, onward)        Start     Ordered   02/03/18 1233  DME Walker rolling  Once    Question:  Patient needs a walker to treat with the following condition  Answer:  Total knee replacement status   02/03/18 1232   02/03/18 1233  DME Bedside commode  Once    Question:  Patient needs a bedside commode to treat with the following condition  Answer:  Total knee replacement status   02/03/18 1232     Follow-up Information    Watt Climes, PA On 02/18/2018.   Specialty:  Physician Assistant Why:  at 1:15pm Contact information: [address] [address] [phone]  Dereck Leep, MD On 03/18/2018.   Specialty:  Orthopedic Surgery Why:  at 10:00am Contact information: [place] [address] [phone]           Signed: Prescott Parma, Makel Mcmann 02/05/2018, 7:13 AM   Objective: Vital signs in last 24 hours: Temp:  [97.6 F (36.4 C)-97.8 F (36.6 C)] 97.8 F (36.6 C) (03/22 0421) Pulse Rate:  [53-64] 64 (03/22 0421) Resp:  [18-19] 19 (03/22 0421) BP: (127-142)/(51-61) 135/61 (03/22 0421) SpO2:  [98 %-100 %] 99 % (03/22 0421)  Intake/Output from previous day:  Intake/Output Summary (Last 24 hours) at 02/05/2018 0713 Last data filed at 02/05/2018 0427 Gross per 24 hour  Intake 3038.33 ml  Output 150 ml  Net 2888.33 ml    Intake/Output this shift: No intake/output data recorded.  Labs: Recent Labs    02/04/18 0510 02/05/18 0542  HGB 10.8* 10.5*   Recent Labs    02/04/18 0510 02/05/18 0542  WBC 10.0 7.2  RBC 3.60* 3.43*  HCT  32.6* 30.9*  PLT 182 165   Recent Labs    02/04/18 0510 02/05/18 0542  NA 133* 135  K 4.7 4.7  CL 102 102  CO2 25 26  BUN 22* 20  CREATININE 1.00 0.92  GLUCOSE 200* 165*  CALCIUM 8.4* 8.5*   No results for input(s): LABPT, INR in the last 72 hours.  EXAM: General - Patient is Alert and Oriented Extremity - Neurovascular intact Dorsiflexion/Plantar flexion intact No cellulitis present Incision - clean, dry, no drainage with the Hemovac removed Motor Function -plantar flexion and dorsiflexion are intact.  Able to do straight leg raise independently.  Assessment/Plan: 2 Days Post-Op Procedure(s) (LRB): COMPUTER ASSISTED TOTAL KNEE ARTHROPLASTY (Left) Procedure(s) (LRB): COMPUTER ASSISTED TOTAL KNEE ARTHROPLASTY (Left) Past Medical History:  Diagnosis Date  . Anemia   . Arthritis   . Asthma    with cough/cold but nothing now  . Chronic kidney disease    due to type 1 diabetes  . Diabetes mellitus without complication ([place]) 7342   on insulin pump. VERY BRITTLE. per patient, it can drop 140 points in an hour  . GERD (gastroesophageal reflux disease)    H/O  . Heart murmur    not followed by anyone  . Hx of cervical spine surgery   . Hyperlipidemia   . Hypothyroidism   . Insulin pump in place   . PONV (postoperative nausea and vomiting)    NO PROBLEMS SINCE USING ZOFRAN INTRAOP   Active Problems:   S/P total knee arthroplasty  Estimated body mass index is 26.5 kg/m as calculated from the following:   Height as of this encounter: 5' 3.5" (1.613 m).   Weight as of this encounter: 68.9 kg (152 lb). Up with therapy Discharge home with home health Diet - Regular diet Follow up - in 2 weeks Activity - WBAT Disposition - Home Condition Upon Discharge - Good DVT Prophylaxis - Lovenox and TED hose  Reche Dixon, PA-C Orthopaedic Surgery 02/05/2018, 7:13 AM

## 2018-02-05 NOTE — Progress Notes (Signed)
Patient ready for discharge home with husband. Reviewed all discharge instructions including prescriptions /aftercare and f/u appointment.

## 2018-02-05 NOTE — Care Management (Addendum)
RNCM has notified Sonia Side with [facility] of patient's discharge. Patient's pharmacy opens at [place] and therefore have not been able to obtain price for Lovenox. Patient has not heard from pharmacy either.  I will check price as time allows. Patient advised to call this RNCM with any concerns if she gets discharged before I can obtain price. Lovenox cost $90- patient aware and agrees. No other RNCM needs.

## 2018-02-05 NOTE — Progress Notes (Signed)
Physical Therapy Treatment Patient Details Name: Elizabeth [name] MRN: [id number] DOB: 09-Apr-1941 Today's Date: 02/05/2018    History of Present Illness Pt underwent L TKR without reported post-op complications. PMH includes DMI, asthma    PT Comments    Excellent mobility progression this date; completing all mobility with no greater than close sup from therapist.  Good L knee control, stability with all functional activities.  Comfortable with current status and mobility needs upon discharge; no further questions/concerns at this time.    Follow Up Recommendations  Home health PT     Equipment Recommendations       Recommendations for Other Services       Precautions / Restrictions Precautions Precautions: Fall;Knee Restrictions Weight Bearing Restrictions: Yes LLE Weight Bearing: Weight bearing as tolerated    Mobility  Bed Mobility               General bed mobility comments: seated in recliner beginning/end of treatment session  Transfers Overall transfer level: Needs assistance Equipment used: Rolling walker (2 wheeled) Transfers: Sit to/from Stand Sit to Stand: Supervision         General transfer comment: good hand placement, LE integration; min cuing for symmetrical foot placement and WBing as tolerated  Ambulation/Gait Ambulation/Gait assistance: Supervision Ambulation Distance (Feet): 375 Feet Assistive device: Rolling walker (2 wheeled)   Gait velocity: 10' walk time, 8-9 seconds Gait velocity interpretation: Below normal speed for age/gender General Gait Details: reciprocal stepping pattern with good L LE stance time, weight shift; good knee control and confidence with gait efforts   Stairs            Wheelchair Mobility    Modified Rankin (Stroke Patients Only)       Balance Overall balance assessment: Needs assistance Sitting-balance support: No upper extremity supported;Feet supported Sitting balance-Leahy Scale: Good      Standing balance support: Bilateral upper extremity supported Standing balance-Leahy Scale: Good                              Cognition Arousal/Alertness: Awake/alert Behavior During Therapy: WFL for tasks assessed/performed Overall Cognitive Status: Within Functional Limits for tasks assessed                                        Exercises Total Joint Exercises Goniometric ROM: L knee: 0-92 degrees, limited by edema, pain in flexion Other Exercises Other Exercises: Standing LE therex, 1x10, with RW, close sup:  heel raises, mini squats, marching, hip abduct/adduct.  Good control/stability in L LE stance time.  Verbally reviewed technique for car transfers; patient voiced understanding.    General Comments        Pertinent Vitals/Pain Pain Assessment: No/denies pain Pain Score: 0-No pain    Home Living                      Prior Function            PT Goals (current goals can now be found in the care plan section) Acute Rehab PT Goals Patient Stated Goal: Return to prior level of function at home with less pain PT Goal Formulation: With patient/family Time For Goal Achievement: 02/17/18 Potential to Achieve Goals: Good Progress towards PT goals: Progressing toward goals    Frequency    BID  PT Plan Current plan remains appropriate    Co-evaluation              AM-PAC PT "6 Clicks" Daily Activity  Outcome Measure  Difficulty turning over in bed (including adjusting bedclothes, sheets and blankets)?: None Difficulty moving from lying on back to sitting on the side of the bed? : None Difficulty sitting down on and standing up from a chair with arms (e.g., wheelchair, bedside commode, etc,.)?: None Help needed moving to and from a bed to chair (including a wheelchair)?: None Help needed walking in hospital room?: A Little Help needed climbing 3-5 steps with a railing? : A Little 6 Click Score: 22    End  of Session Equipment Utilized During Treatment: Gait belt Activity Tolerance: Patient tolerated treatment well Patient left: in chair;with call bell/phone within reach;with chair alarm set Nurse Communication: Mobility status PT Visit Diagnosis: Other abnormalities of gait and mobility (R26.89);Muscle weakness (generalized) (M62.81);Difficulty in walking, not elsewhere classified (R26.2);Pain Pain - Right/Left: Left Pain - part of body: Knee     Time: 1561-5379 PT Time Calculation (min) (ACUTE ONLY): 18 min  Charges:  $Gait Training: 8-22 mins                    G Codes:      Duff Pozzi H. Owens Shark, PT, DPT, NCS 02/05/18, 10:00 AM [phone]

## 2018-02-06 DIAGNOSIS — E785 Hyperlipidemia, unspecified: Secondary | ICD-10-CM | POA: Diagnosis not present

## 2018-02-06 DIAGNOSIS — E039 Hypothyroidism, unspecified: Secondary | ICD-10-CM | POA: Diagnosis not present

## 2018-02-06 DIAGNOSIS — Z9181 History of falling: Secondary | ICD-10-CM | POA: Diagnosis not present

## 2018-02-06 DIAGNOSIS — D631 Anemia in chronic kidney disease: Secondary | ICD-10-CM | POA: Diagnosis not present

## 2018-02-06 DIAGNOSIS — Z96652 Presence of left artificial knee joint: Secondary | ICD-10-CM | POA: Diagnosis not present

## 2018-02-06 DIAGNOSIS — Z471 Aftercare following joint replacement surgery: Secondary | ICD-10-CM | POA: Diagnosis not present

## 2018-02-06 DIAGNOSIS — Z7901 Long term (current) use of anticoagulants: Secondary | ICD-10-CM | POA: Diagnosis not present

## 2018-02-06 DIAGNOSIS — N182 Chronic kidney disease, stage 2 (mild): Secondary | ICD-10-CM | POA: Diagnosis not present

## 2018-02-06 DIAGNOSIS — E1022 Type 1 diabetes mellitus with diabetic chronic kidney disease: Secondary | ICD-10-CM | POA: Diagnosis not present

## 2018-02-15 DIAGNOSIS — E039 Hypothyroidism, unspecified: Secondary | ICD-10-CM | POA: Diagnosis not present

## 2018-02-15 DIAGNOSIS — E785 Hyperlipidemia, unspecified: Secondary | ICD-10-CM | POA: Diagnosis not present

## 2018-02-15 DIAGNOSIS — Z96652 Presence of left artificial knee joint: Secondary | ICD-10-CM | POA: Diagnosis not present

## 2018-02-15 DIAGNOSIS — Z7901 Long term (current) use of anticoagulants: Secondary | ICD-10-CM | POA: Diagnosis not present

## 2018-02-15 DIAGNOSIS — N182 Chronic kidney disease, stage 2 (mild): Secondary | ICD-10-CM | POA: Diagnosis not present

## 2018-02-15 DIAGNOSIS — D631 Anemia in chronic kidney disease: Secondary | ICD-10-CM | POA: Diagnosis not present

## 2018-02-15 DIAGNOSIS — E1022 Type 1 diabetes mellitus with diabetic chronic kidney disease: Secondary | ICD-10-CM | POA: Diagnosis not present

## 2018-02-15 DIAGNOSIS — Z471 Aftercare following joint replacement surgery: Secondary | ICD-10-CM | POA: Diagnosis not present

## 2018-02-15 DIAGNOSIS — Z9181 History of falling: Secondary | ICD-10-CM | POA: Diagnosis not present

## 2018-02-18 DIAGNOSIS — M25562 Pain in left knee: Secondary | ICD-10-CM | POA: Diagnosis not present

## 2018-02-18 DIAGNOSIS — M25662 Stiffness of left knee, not elsewhere classified: Secondary | ICD-10-CM | POA: Diagnosis not present

## 2018-02-18 DIAGNOSIS — Z96652 Presence of left artificial knee joint: Secondary | ICD-10-CM | POA: Diagnosis not present

## 2018-02-18 DIAGNOSIS — M6281 Muscle weakness (generalized): Secondary | ICD-10-CM | POA: Diagnosis not present

## 2018-02-22 DIAGNOSIS — Z96652 Presence of left artificial knee joint: Secondary | ICD-10-CM | POA: Diagnosis not present

## 2018-02-22 DIAGNOSIS — M25562 Pain in left knee: Secondary | ICD-10-CM | POA: Diagnosis not present

## 2018-02-24 DIAGNOSIS — Z96652 Presence of left artificial knee joint: Secondary | ICD-10-CM | POA: Diagnosis not present

## 2018-02-26 DIAGNOSIS — M25562 Pain in left knee: Secondary | ICD-10-CM | POA: Diagnosis not present

## 2018-02-26 DIAGNOSIS — Z96652 Presence of left artificial knee joint: Secondary | ICD-10-CM | POA: Diagnosis not present

## 2018-03-01 DIAGNOSIS — R110 Nausea: Secondary | ICD-10-CM | POA: Diagnosis not present

## 2018-03-02 DIAGNOSIS — Z96652 Presence of left artificial knee joint: Secondary | ICD-10-CM | POA: Diagnosis not present

## 2018-03-02 DIAGNOSIS — M25562 Pain in left knee: Secondary | ICD-10-CM | POA: Diagnosis not present

## 2018-03-04 DIAGNOSIS — M25562 Pain in left knee: Secondary | ICD-10-CM | POA: Diagnosis not present

## 2018-03-04 DIAGNOSIS — Z96652 Presence of left artificial knee joint: Secondary | ICD-10-CM | POA: Diagnosis not present

## 2018-03-08 DIAGNOSIS — M25562 Pain in left knee: Secondary | ICD-10-CM | POA: Diagnosis not present

## 2018-03-08 DIAGNOSIS — Z96652 Presence of left artificial knee joint: Secondary | ICD-10-CM | POA: Diagnosis not present

## 2018-03-10 DIAGNOSIS — Z96652 Presence of left artificial knee joint: Secondary | ICD-10-CM | POA: Diagnosis not present

## 2018-03-10 DIAGNOSIS — M25562 Pain in left knee: Secondary | ICD-10-CM | POA: Diagnosis not present

## 2018-03-12 DIAGNOSIS — Z96652 Presence of left artificial knee joint: Secondary | ICD-10-CM | POA: Diagnosis not present

## 2018-03-12 DIAGNOSIS — M25562 Pain in left knee: Secondary | ICD-10-CM | POA: Diagnosis not present

## 2018-03-15 DIAGNOSIS — Z96652 Presence of left artificial knee joint: Secondary | ICD-10-CM | POA: Diagnosis not present

## 2018-03-15 DIAGNOSIS — M25562 Pain in left knee: Secondary | ICD-10-CM | POA: Diagnosis not present

## 2018-03-15 DIAGNOSIS — E10649 Type 1 diabetes mellitus with hypoglycemia without coma: Secondary | ICD-10-CM | POA: Diagnosis not present

## 2018-03-17 DIAGNOSIS — M25562 Pain in left knee: Secondary | ICD-10-CM | POA: Diagnosis not present

## 2018-03-17 DIAGNOSIS — Z96652 Presence of left artificial knee joint: Secondary | ICD-10-CM | POA: Diagnosis not present

## 2018-03-18 DIAGNOSIS — Z96652 Presence of left artificial knee joint: Secondary | ICD-10-CM | POA: Diagnosis not present

## 2018-03-25 DIAGNOSIS — Z794 Long term (current) use of insulin: Secondary | ICD-10-CM | POA: Diagnosis not present

## 2018-03-25 DIAGNOSIS — E10649 Type 1 diabetes mellitus with hypoglycemia without coma: Secondary | ICD-10-CM | POA: Diagnosis not present

## 2018-03-25 DIAGNOSIS — E119 Type 2 diabetes mellitus without complications: Secondary | ICD-10-CM | POA: Diagnosis not present

## 2018-03-25 DIAGNOSIS — Z5181 Encounter for therapeutic drug level monitoring: Secondary | ICD-10-CM | POA: Diagnosis not present

## 2018-03-26 DIAGNOSIS — M25562 Pain in left knee: Secondary | ICD-10-CM | POA: Diagnosis not present

## 2018-03-26 DIAGNOSIS — Z96652 Presence of left artificial knee joint: Secondary | ICD-10-CM | POA: Diagnosis not present

## 2018-03-31 DIAGNOSIS — E039 Hypothyroidism, unspecified: Secondary | ICD-10-CM | POA: Diagnosis not present

## 2018-03-31 DIAGNOSIS — E1069 Type 1 diabetes mellitus with other specified complication: Secondary | ICD-10-CM | POA: Diagnosis not present

## 2018-03-31 DIAGNOSIS — E109 Type 1 diabetes mellitus without complications: Secondary | ICD-10-CM | POA: Diagnosis not present

## 2018-03-31 DIAGNOSIS — E785 Hyperlipidemia, unspecified: Secondary | ICD-10-CM | POA: Diagnosis not present

## 2018-03-31 DIAGNOSIS — Z471 Aftercare following joint replacement surgery: Secondary | ICD-10-CM | POA: Diagnosis not present

## 2018-04-01 DIAGNOSIS — E109 Type 1 diabetes mellitus without complications: Secondary | ICD-10-CM | POA: Diagnosis not present

## 2018-04-01 DIAGNOSIS — E1069 Type 1 diabetes mellitus with other specified complication: Secondary | ICD-10-CM | POA: Diagnosis not present

## 2018-04-01 DIAGNOSIS — E785 Hyperlipidemia, unspecified: Secondary | ICD-10-CM | POA: Diagnosis not present

## 2018-04-01 DIAGNOSIS — E538 Deficiency of other specified B group vitamins: Secondary | ICD-10-CM | POA: Diagnosis not present

## 2018-04-01 DIAGNOSIS — E039 Hypothyroidism, unspecified: Secondary | ICD-10-CM | POA: Diagnosis not present

## 2018-04-06 DIAGNOSIS — M25562 Pain in left knee: Secondary | ICD-10-CM | POA: Diagnosis not present

## 2018-04-06 DIAGNOSIS — Z96652 Presence of left artificial knee joint: Secondary | ICD-10-CM | POA: Diagnosis not present

## 2018-05-24 DIAGNOSIS — E119 Type 2 diabetes mellitus without complications: Secondary | ICD-10-CM | POA: Diagnosis not present

## 2018-06-25 DIAGNOSIS — E10649 Type 1 diabetes mellitus with hypoglycemia without coma: Secondary | ICD-10-CM | POA: Diagnosis not present

## 2018-07-13 DIAGNOSIS — R1031 Right lower quadrant pain: Secondary | ICD-10-CM | POA: Diagnosis not present

## 2018-07-13 DIAGNOSIS — R1032 Left lower quadrant pain: Secondary | ICD-10-CM | POA: Diagnosis not present

## 2018-07-20 DIAGNOSIS — E109 Type 1 diabetes mellitus without complications: Secondary | ICD-10-CM | POA: Diagnosis not present

## 2018-07-20 DIAGNOSIS — E785 Hyperlipidemia, unspecified: Secondary | ICD-10-CM | POA: Diagnosis not present

## 2018-07-20 DIAGNOSIS — E11649 Type 2 diabetes mellitus with hypoglycemia without coma: Secondary | ICD-10-CM | POA: Diagnosis not present

## 2018-07-20 DIAGNOSIS — E1069 Type 1 diabetes mellitus with other specified complication: Secondary | ICD-10-CM | POA: Diagnosis not present

## 2018-07-20 DIAGNOSIS — E039 Hypothyroidism, unspecified: Secondary | ICD-10-CM | POA: Diagnosis not present

## 2018-07-27 DIAGNOSIS — D485 Neoplasm of uncertain behavior of skin: Secondary | ICD-10-CM | POA: Diagnosis not present

## 2018-07-27 DIAGNOSIS — L570 Actinic keratosis: Secondary | ICD-10-CM | POA: Diagnosis not present

## 2018-07-27 DIAGNOSIS — L821 Other seborrheic keratosis: Secondary | ICD-10-CM | POA: Diagnosis not present

## 2018-07-27 DIAGNOSIS — C44722 Squamous cell carcinoma of skin of right lower limb, including hip: Secondary | ICD-10-CM | POA: Diagnosis not present

## 2018-08-02 DIAGNOSIS — C44722 Squamous cell carcinoma of skin of right lower limb, including hip: Secondary | ICD-10-CM | POA: Diagnosis not present

## 2018-08-02 DIAGNOSIS — L570 Actinic keratosis: Secondary | ICD-10-CM | POA: Diagnosis not present

## 2018-09-09 ENCOUNTER — Other Ambulatory Visit: Payer: Self-pay | Admitting: Internal Medicine

## 2018-09-09 DIAGNOSIS — Z1231 Encounter for screening mammogram for malignant neoplasm of breast: Secondary | ICD-10-CM

## 2018-09-28 DIAGNOSIS — E039 Hypothyroidism, unspecified: Secondary | ICD-10-CM | POA: Diagnosis not present

## 2018-09-28 DIAGNOSIS — E109 Type 1 diabetes mellitus without complications: Secondary | ICD-10-CM | POA: Diagnosis not present

## 2018-09-28 DIAGNOSIS — E1069 Type 1 diabetes mellitus with other specified complication: Secondary | ICD-10-CM | POA: Diagnosis not present

## 2018-09-28 DIAGNOSIS — E785 Hyperlipidemia, unspecified: Secondary | ICD-10-CM | POA: Diagnosis not present

## 2018-09-30 DIAGNOSIS — E10649 Type 1 diabetes mellitus with hypoglycemia without coma: Secondary | ICD-10-CM | POA: Diagnosis not present

## 2018-10-06 DIAGNOSIS — E10649 Type 1 diabetes mellitus with hypoglycemia without coma: Secondary | ICD-10-CM | POA: Diagnosis not present

## 2018-10-07 DIAGNOSIS — E785 Hyperlipidemia, unspecified: Secondary | ICD-10-CM | POA: Diagnosis not present

## 2018-10-07 DIAGNOSIS — E1069 Type 1 diabetes mellitus with other specified complication: Secondary | ICD-10-CM | POA: Diagnosis not present

## 2018-10-07 DIAGNOSIS — E039 Hypothyroidism, unspecified: Secondary | ICD-10-CM | POA: Diagnosis not present

## 2018-10-07 DIAGNOSIS — Z1331 Encounter for screening for depression: Secondary | ICD-10-CM | POA: Diagnosis not present

## 2018-10-07 DIAGNOSIS — Z0000 Encounter for general adult medical examination without abnormal findings: Secondary | ICD-10-CM | POA: Diagnosis not present

## 2018-10-07 DIAGNOSIS — Z0001 Encounter for general adult medical examination with abnormal findings: Secondary | ICD-10-CM | POA: Diagnosis not present

## 2018-10-07 DIAGNOSIS — E538 Deficiency of other specified B group vitamins: Secondary | ICD-10-CM | POA: Diagnosis not present

## 2018-10-07 DIAGNOSIS — E109 Type 1 diabetes mellitus without complications: Secondary | ICD-10-CM | POA: Diagnosis not present

## 2018-11-17 DIAGNOSIS — E10649 Type 1 diabetes mellitus with hypoglycemia without coma: Secondary | ICD-10-CM | POA: Diagnosis not present

## 2018-11-19 ENCOUNTER — Ambulatory Visit
Admission: RE | Admit: 2018-11-19 | Discharge: 2018-11-19 | Disposition: A | Discharge status: Home / Self Care | Payer: PPO | Source: Ambulatory Visit | Attending: Internal Medicine | Admitting: Internal Medicine

## 2018-11-19 DIAGNOSIS — Z1231 Encounter for screening mammogram for malignant neoplasm of breast: Secondary | ICD-10-CM | POA: Diagnosis not present

## 2018-12-27 DIAGNOSIS — R1084 Generalized abdominal pain: Secondary | ICD-10-CM | POA: Diagnosis not present

## 2018-12-27 DIAGNOSIS — J984 Other disorders of lung: Secondary | ICD-10-CM | POA: Diagnosis not present

## 2019-01-06 DIAGNOSIS — L570 Actinic keratosis: Secondary | ICD-10-CM | POA: Diagnosis not present

## 2019-01-06 DIAGNOSIS — L821 Other seborrheic keratosis: Secondary | ICD-10-CM | POA: Diagnosis not present

## 2019-01-06 DIAGNOSIS — L820 Inflamed seborrheic keratosis: Secondary | ICD-10-CM | POA: Diagnosis not present

## 2019-01-06 DIAGNOSIS — Z85828 Personal history of other malignant neoplasm of skin: Secondary | ICD-10-CM | POA: Diagnosis not present

## 2019-01-06 DIAGNOSIS — C44629 Squamous cell carcinoma of skin of left upper limb, including shoulder: Secondary | ICD-10-CM | POA: Diagnosis not present

## 2019-01-06 DIAGNOSIS — D485 Neoplasm of uncertain behavior of skin: Secondary | ICD-10-CM | POA: Diagnosis not present

## 2019-01-06 DIAGNOSIS — X32XXXA Exposure to sunlight, initial encounter: Secondary | ICD-10-CM | POA: Diagnosis not present

## 2019-01-06 DIAGNOSIS — Z872 Personal history of diseases of the skin and subcutaneous tissue: Secondary | ICD-10-CM | POA: Diagnosis not present

## 2019-01-06 DIAGNOSIS — L814 Other melanin hyperpigmentation: Secondary | ICD-10-CM | POA: Diagnosis not present

## 2019-01-06 DIAGNOSIS — Z08 Encounter for follow-up examination after completed treatment for malignant neoplasm: Secondary | ICD-10-CM | POA: Diagnosis not present

## 2019-01-24 DIAGNOSIS — E10649 Type 1 diabetes mellitus with hypoglycemia without coma: Secondary | ICD-10-CM | POA: Diagnosis not present

## 2019-01-26 DIAGNOSIS — C44629 Squamous cell carcinoma of skin of left upper limb, including shoulder: Secondary | ICD-10-CM | POA: Diagnosis not present

## 2019-01-26 DIAGNOSIS — D0462 Carcinoma in situ of skin of left upper limb, including shoulder: Secondary | ICD-10-CM | POA: Diagnosis not present

## 2019-02-01 DIAGNOSIS — Z96652 Presence of left artificial knee joint: Secondary | ICD-10-CM | POA: Diagnosis not present

## 2019-02-01 DIAGNOSIS — M25562 Pain in left knee: Secondary | ICD-10-CM | POA: Diagnosis not present

## 2019-02-08 DIAGNOSIS — E109 Type 1 diabetes mellitus without complications: Secondary | ICD-10-CM | POA: Diagnosis not present

## 2019-02-08 DIAGNOSIS — E039 Hypothyroidism, unspecified: Secondary | ICD-10-CM | POA: Diagnosis not present

## 2019-02-08 DIAGNOSIS — E1069 Type 1 diabetes mellitus with other specified complication: Secondary | ICD-10-CM | POA: Diagnosis not present

## 2019-02-08 DIAGNOSIS — E785 Hyperlipidemia, unspecified: Secondary | ICD-10-CM | POA: Diagnosis not present

## 2019-04-01 DIAGNOSIS — E109 Type 1 diabetes mellitus without complications: Secondary | ICD-10-CM | POA: Diagnosis not present

## 2019-04-07 DIAGNOSIS — Z0000 Encounter for general adult medical examination without abnormal findings: Secondary | ICD-10-CM | POA: Diagnosis not present

## 2019-04-07 DIAGNOSIS — E109 Type 1 diabetes mellitus without complications: Secondary | ICD-10-CM | POA: Diagnosis not present

## 2019-04-07 DIAGNOSIS — E039 Hypothyroidism, unspecified: Secondary | ICD-10-CM | POA: Diagnosis not present

## 2019-04-07 DIAGNOSIS — E538 Deficiency of other specified B group vitamins: Secondary | ICD-10-CM | POA: Diagnosis not present

## 2019-04-26 DIAGNOSIS — E10649 Type 1 diabetes mellitus with hypoglycemia without coma: Secondary | ICD-10-CM | POA: Diagnosis not present

## 2019-04-28 DIAGNOSIS — Z872 Personal history of diseases of the skin and subcutaneous tissue: Secondary | ICD-10-CM | POA: Diagnosis not present

## 2019-04-28 DIAGNOSIS — D2271 Melanocytic nevi of right lower limb, including hip: Secondary | ICD-10-CM | POA: Diagnosis not present

## 2019-04-28 DIAGNOSIS — D225 Melanocytic nevi of trunk: Secondary | ICD-10-CM | POA: Diagnosis not present

## 2019-04-28 DIAGNOSIS — Z85828 Personal history of other malignant neoplasm of skin: Secondary | ICD-10-CM | POA: Diagnosis not present

## 2019-04-28 DIAGNOSIS — D2262 Melanocytic nevi of left upper limb, including shoulder: Secondary | ICD-10-CM | POA: Diagnosis not present

## 2019-04-28 DIAGNOSIS — L821 Other seborrheic keratosis: Secondary | ICD-10-CM | POA: Diagnosis not present

## 2019-04-28 DIAGNOSIS — D2261 Melanocytic nevi of right upper limb, including shoulder: Secondary | ICD-10-CM | POA: Diagnosis not present

## 2019-04-28 DIAGNOSIS — D2272 Melanocytic nevi of left lower limb, including hip: Secondary | ICD-10-CM | POA: Diagnosis not present

## 2019-04-28 DIAGNOSIS — Z08 Encounter for follow-up examination after completed treatment for malignant neoplasm: Secondary | ICD-10-CM | POA: Diagnosis not present

## 2019-05-12 DIAGNOSIS — M546 Pain in thoracic spine: Secondary | ICD-10-CM | POA: Diagnosis not present

## 2019-05-12 DIAGNOSIS — M5489 Other dorsalgia: Secondary | ICD-10-CM | POA: Diagnosis not present

## 2019-06-13 DIAGNOSIS — E119 Type 2 diabetes mellitus without complications: Secondary | ICD-10-CM | POA: Diagnosis not present

## 2019-07-27 DIAGNOSIS — E10649 Type 1 diabetes mellitus with hypoglycemia without coma: Secondary | ICD-10-CM | POA: Diagnosis not present

## 2019-08-17 DIAGNOSIS — E785 Hyperlipidemia, unspecified: Secondary | ICD-10-CM | POA: Diagnosis not present

## 2019-08-17 DIAGNOSIS — E109 Type 1 diabetes mellitus without complications: Secondary | ICD-10-CM | POA: Diagnosis not present

## 2019-08-17 DIAGNOSIS — E039 Hypothyroidism, unspecified: Secondary | ICD-10-CM | POA: Diagnosis not present

## 2019-08-17 DIAGNOSIS — E1069 Type 1 diabetes mellitus with other specified complication: Secondary | ICD-10-CM | POA: Diagnosis not present

## 2019-08-19 ENCOUNTER — Other Ambulatory Visit: Payer: Self-pay | Admitting: Internal Medicine

## 2019-08-19 DIAGNOSIS — Z1231 Encounter for screening mammogram for malignant neoplasm of breast: Secondary | ICD-10-CM

## 2019-10-05 DIAGNOSIS — E039 Hypothyroidism, unspecified: Secondary | ICD-10-CM | POA: Diagnosis not present

## 2019-10-05 DIAGNOSIS — E109 Type 1 diabetes mellitus without complications: Secondary | ICD-10-CM | POA: Diagnosis not present

## 2019-10-12 DIAGNOSIS — E1069 Type 1 diabetes mellitus with other specified complication: Secondary | ICD-10-CM | POA: Diagnosis not present

## 2019-10-12 DIAGNOSIS — E785 Hyperlipidemia, unspecified: Secondary | ICD-10-CM | POA: Diagnosis not present

## 2019-10-12 DIAGNOSIS — E039 Hypothyroidism, unspecified: Secondary | ICD-10-CM | POA: Diagnosis not present

## 2019-10-12 DIAGNOSIS — E109 Type 1 diabetes mellitus without complications: Secondary | ICD-10-CM | POA: Diagnosis not present

## 2019-10-12 DIAGNOSIS — Z0001 Encounter for general adult medical examination with abnormal findings: Secondary | ICD-10-CM | POA: Diagnosis not present

## 2019-10-26 DIAGNOSIS — E10649 Type 1 diabetes mellitus with hypoglycemia without coma: Secondary | ICD-10-CM | POA: Diagnosis not present

## 2019-11-18 DIAGNOSIS — E10649 Type 1 diabetes mellitus with hypoglycemia without coma: Secondary | ICD-10-CM | POA: Diagnosis not present

## 2019-11-21 ENCOUNTER — Ambulatory Visit
Admission: RE | Admit: 2019-11-21 | Discharge: 2019-11-21 | Disposition: A | Discharge status: Home / Self Care | Payer: PPO | Source: Ambulatory Visit | Attending: Internal Medicine | Admitting: Internal Medicine

## 2019-11-21 DIAGNOSIS — Z1231 Encounter for screening mammogram for malignant neoplasm of breast: Secondary | ICD-10-CM | POA: Insufficient documentation

## 2019-12-06 ENCOUNTER — Ambulatory Visit: Payer: PPO | Attending: Internal Medicine

## 2019-12-06 DIAGNOSIS — Z23 Encounter for immunization: Secondary | ICD-10-CM

## 2019-12-06 NOTE — Progress Notes (Signed)
   Covid-19 Vaccination Clinic  Name:  Elizabeth [name]    MRN: [id number] DOB: 1941-05-12  12/06/2019  Elizabeth [name] was observed post Covid-19 immunization for 15 minutes without incidence. She was provided with Vaccine Information Sheet and instruction to access the V-Safe system.   Elizabeth [name] was instructed to call 911 with any severe reactions post vaccine: Marland Kitchen Difficulty breathing  . Swelling of your face and throat  . A fast heartbeat  . A bad rash all over your body  . Dizziness and weakness    Immunizations Administered    Name Date Dose VIS Date Route   Pfizer COVID-19 Vaccine 12/06/2019  2:44 PM 0.3 mL 10/28/2019 Intramuscular   Manufacturer: [place]   Lot: [id number]   [place]: [id number]

## 2019-12-26 ENCOUNTER — Ambulatory Visit: Payer: PPO | Attending: Internal Medicine

## 2019-12-26 DIAGNOSIS — Z23 Encounter for immunization: Secondary | ICD-10-CM

## 2019-12-26 NOTE — Progress Notes (Signed)
   Covid-19 Vaccination Clinic  Name:  Elizabeth [name]    MRN: [id number] DOB: [birth year]  12/26/2019  Elizabeth [name] was observed post Covid-19 immunization for 15 minutes without incidence. She was provided with Vaccine Information Sheet and instruction to access the V-Safe system.   Elizabeth [name] was instructed to call 911 with any severe reactions post vaccine: Marland Kitchen Difficulty breathing  . Swelling of your face and throat  . A fast heartbeat  . A bad rash all over your body  . Dizziness and weakness

## 2020-01-03 DIAGNOSIS — D485 Neoplasm of uncertain behavior of skin: Secondary | ICD-10-CM | POA: Diagnosis not present

## 2020-01-03 DIAGNOSIS — L821 Other seborrheic keratosis: Secondary | ICD-10-CM | POA: Diagnosis not present

## 2020-01-03 DIAGNOSIS — D2272 Melanocytic nevi of left lower limb, including hip: Secondary | ICD-10-CM | POA: Diagnosis not present

## 2020-01-03 DIAGNOSIS — Z85828 Personal history of other malignant neoplasm of skin: Secondary | ICD-10-CM | POA: Diagnosis not present

## 2020-01-03 DIAGNOSIS — D2261 Melanocytic nevi of right upper limb, including shoulder: Secondary | ICD-10-CM | POA: Diagnosis not present

## 2020-01-03 DIAGNOSIS — D2262 Melanocytic nevi of left upper limb, including shoulder: Secondary | ICD-10-CM | POA: Diagnosis not present

## 2020-01-03 DIAGNOSIS — D2271 Melanocytic nevi of right lower limb, including hip: Secondary | ICD-10-CM | POA: Diagnosis not present

## 2020-01-03 DIAGNOSIS — L820 Inflamed seborrheic keratosis: Secondary | ICD-10-CM | POA: Diagnosis not present

## 2020-01-24 DIAGNOSIS — E10649 Type 1 diabetes mellitus with hypoglycemia without coma: Secondary | ICD-10-CM | POA: Diagnosis not present

## 2020-02-06 DIAGNOSIS — J019 Acute sinusitis, unspecified: Secondary | ICD-10-CM | POA: Diagnosis not present

## 2020-02-06 DIAGNOSIS — H539 Unspecified visual disturbance: Secondary | ICD-10-CM | POA: Diagnosis not present

## 2020-02-06 DIAGNOSIS — E109 Type 1 diabetes mellitus without complications: Secondary | ICD-10-CM | POA: Diagnosis not present

## 2020-02-15 DIAGNOSIS — E1069 Type 1 diabetes mellitus with other specified complication: Secondary | ICD-10-CM | POA: Diagnosis not present

## 2020-02-15 DIAGNOSIS — E785 Hyperlipidemia, unspecified: Secondary | ICD-10-CM | POA: Diagnosis not present

## 2020-02-15 DIAGNOSIS — E109 Type 1 diabetes mellitus without complications: Secondary | ICD-10-CM | POA: Diagnosis not present

## 2020-02-15 DIAGNOSIS — E039 Hypothyroidism, unspecified: Secondary | ICD-10-CM | POA: Diagnosis not present

## 2020-02-15 IMAGING — MG DIGITAL SCREENING BILATERAL MAMMOGRAM WITH TOMO AND CAD
6 of 10 series · 6 of 30 positions shown · non-contrast
Comparison: Previous exam(s).

CLINICAL DATA: Screening.

EXAM:
DIGITAL SCREENING BILATERAL MAMMOGRAM WITH TOMO AND CAD

[R MLO synth-2D]
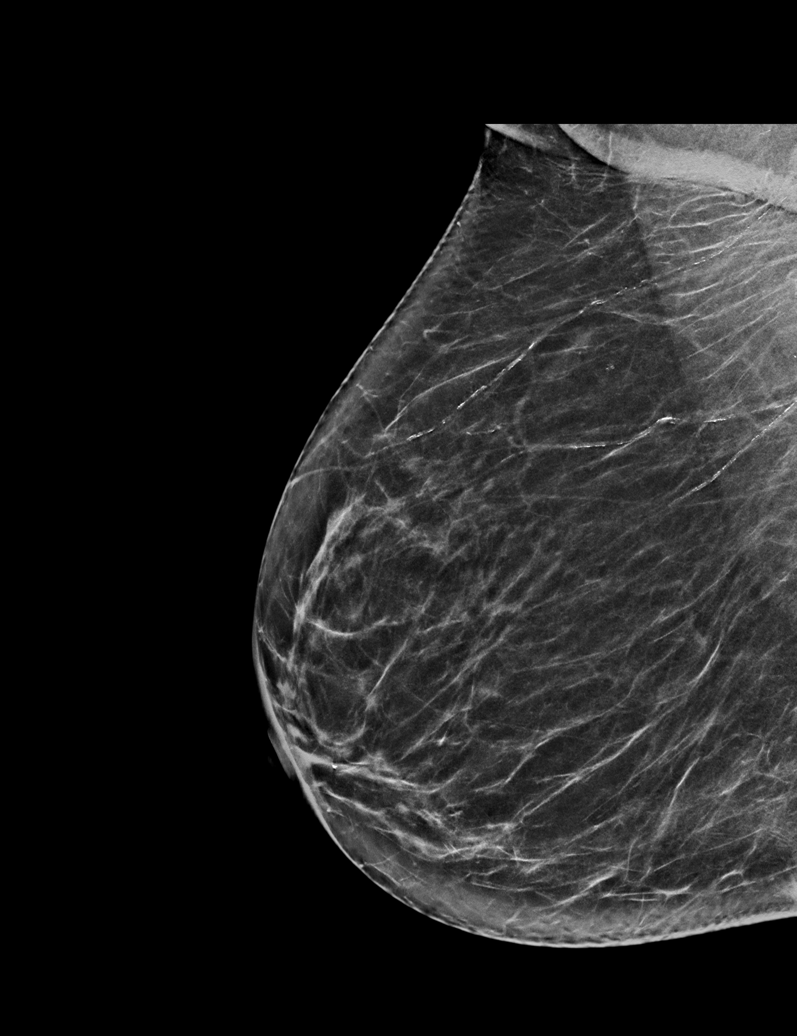

[L CC synth-2D (1 of 2)]
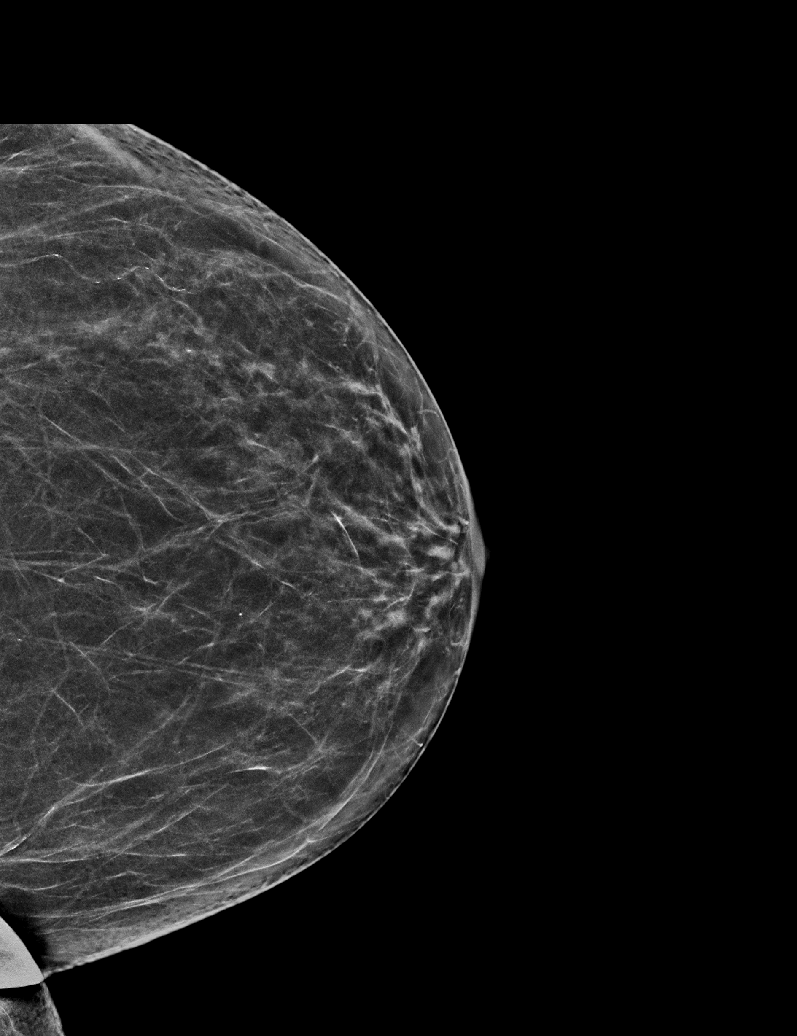

[R CC synth-2D]
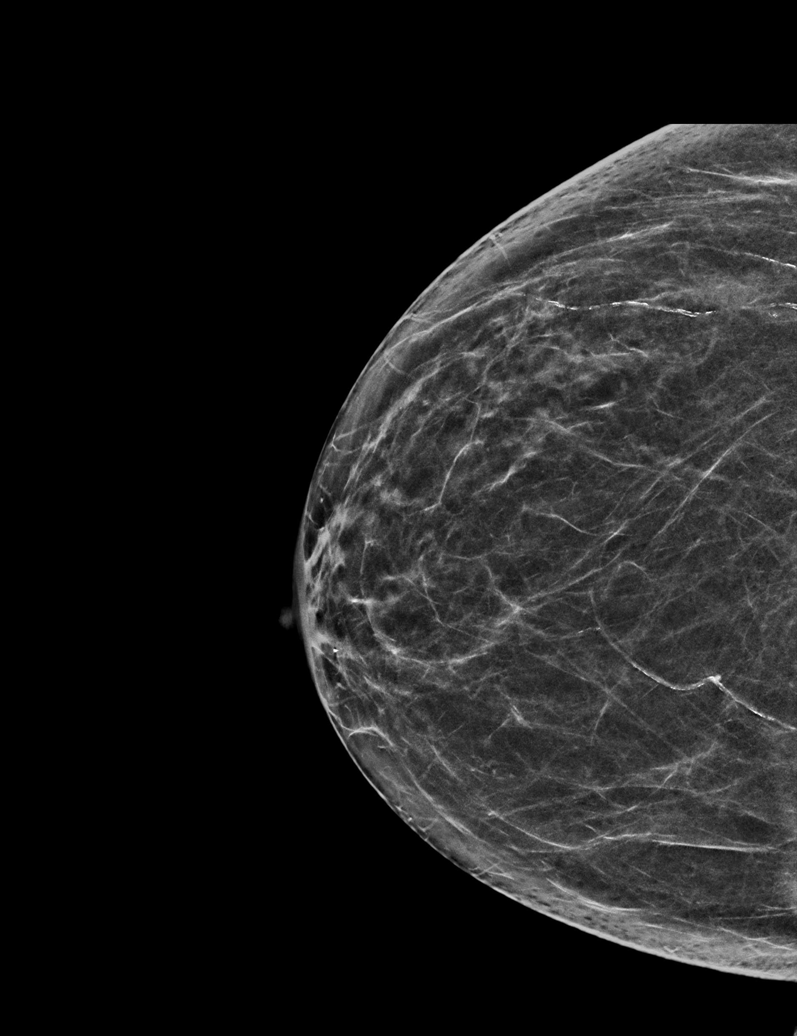

[L MLO synth-2D]
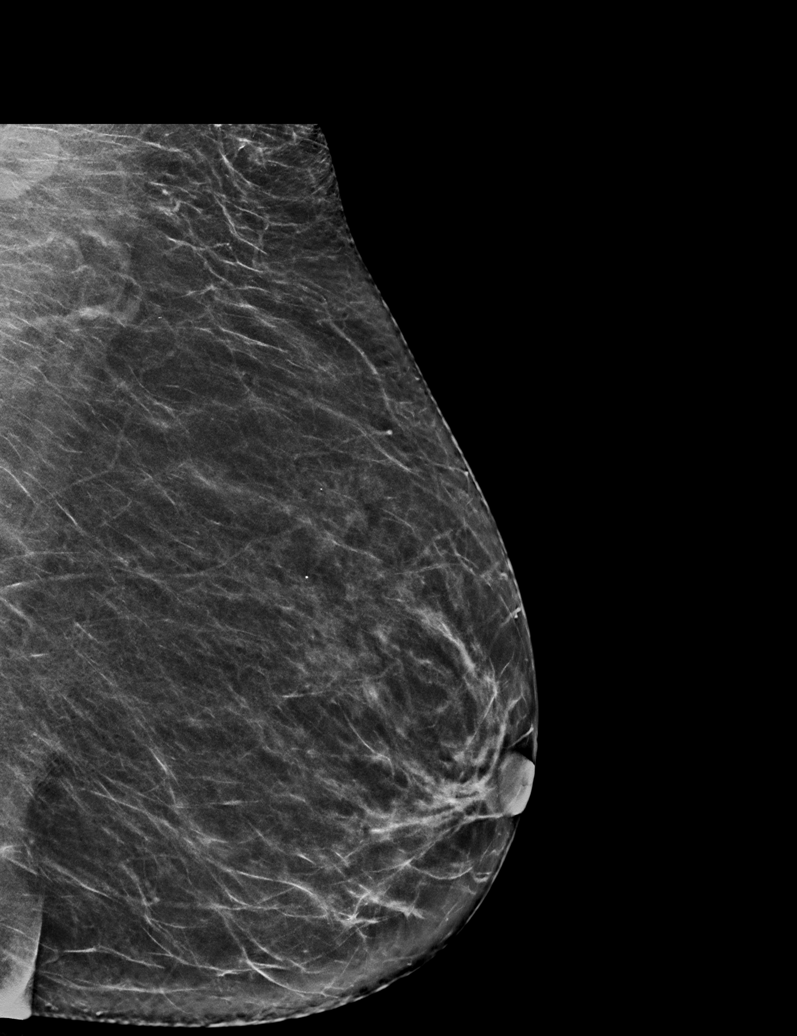

[L CC synth-2D (2 of 2)]
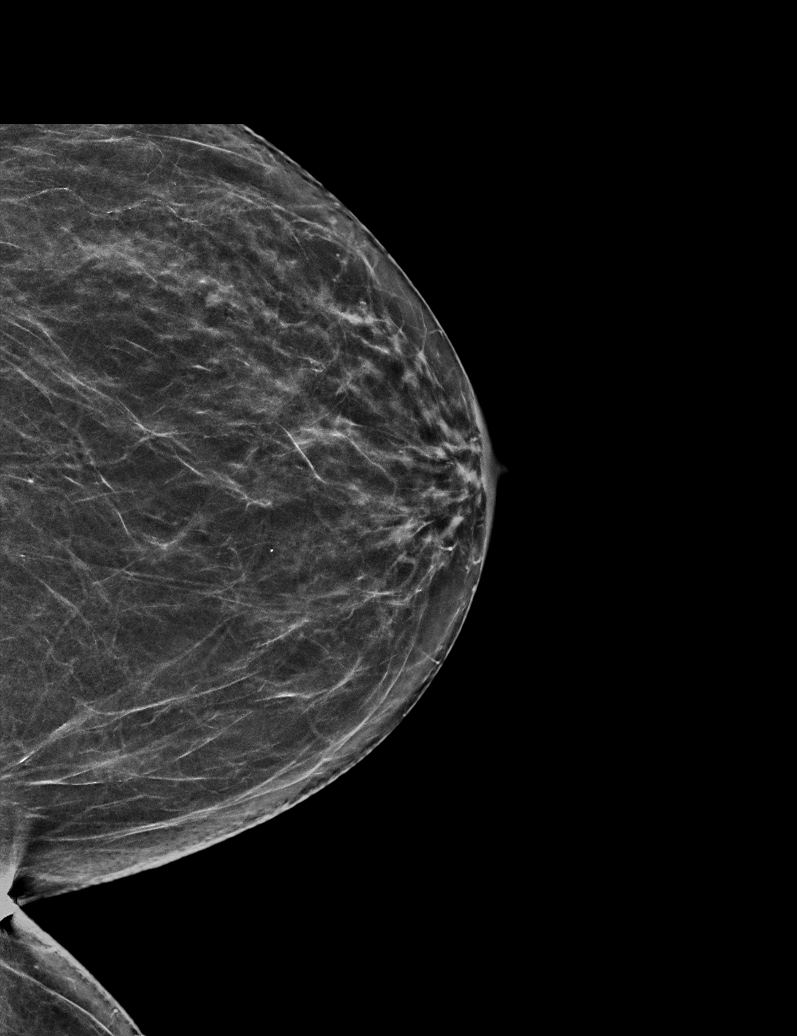

[L MLO tomo · tomo slice 33/64.0]
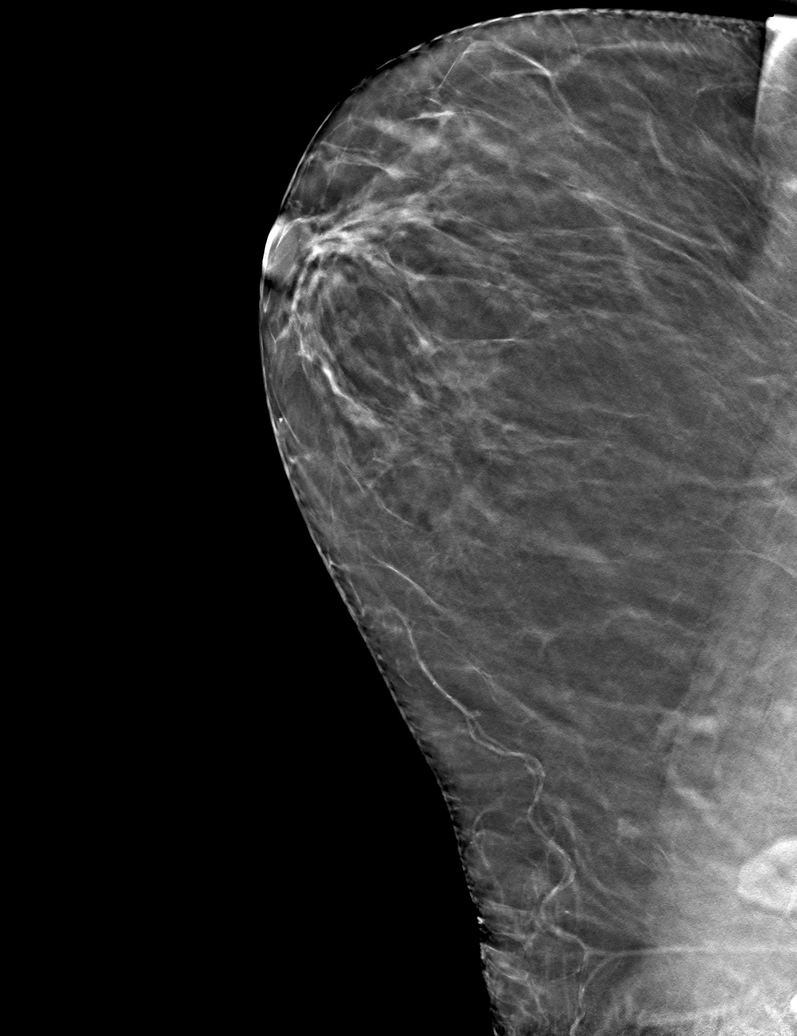

[6 of 30 positions shown; findings below may reference images not displayed]

ACR Breast Density Category b: There are scattered areas of
fibroglandular density.
FINDINGS: There are no findings suspicious for malignancy. Images were
processed with CAD.
IMPRESSION: No mammographic evidence of malignancy. A result letter of this
screening mammogram will be mailed directly to the patient.

RECOMMENDATION:
Screening mammogram in one year. (Code:CN-U-775)

BI-RADS CATEGORY  1: Negative.

## 2020-03-07 DIAGNOSIS — J019 Acute sinusitis, unspecified: Secondary | ICD-10-CM | POA: Diagnosis not present

## 2020-04-04 DIAGNOSIS — E109 Type 1 diabetes mellitus without complications: Secondary | ICD-10-CM | POA: Diagnosis not present

## 2020-04-04 DIAGNOSIS — E039 Hypothyroidism, unspecified: Secondary | ICD-10-CM | POA: Diagnosis not present

## 2020-04-11 DIAGNOSIS — E538 Deficiency of other specified B group vitamins: Secondary | ICD-10-CM | POA: Diagnosis not present

## 2020-04-11 DIAGNOSIS — E785 Hyperlipidemia, unspecified: Secondary | ICD-10-CM | POA: Diagnosis not present

## 2020-04-11 DIAGNOSIS — Z0000 Encounter for general adult medical examination without abnormal findings: Secondary | ICD-10-CM | POA: Diagnosis not present

## 2020-04-11 DIAGNOSIS — E039 Hypothyroidism, unspecified: Secondary | ICD-10-CM | POA: Diagnosis not present

## 2020-04-11 DIAGNOSIS — E1069 Type 1 diabetes mellitus with other specified complication: Secondary | ICD-10-CM | POA: Diagnosis not present

## 2020-04-11 DIAGNOSIS — E109 Type 1 diabetes mellitus without complications: Secondary | ICD-10-CM | POA: Diagnosis not present

## 2020-04-25 DIAGNOSIS — E10649 Type 1 diabetes mellitus with hypoglycemia without coma: Secondary | ICD-10-CM | POA: Diagnosis not present

## 2020-06-14 DIAGNOSIS — E119 Type 2 diabetes mellitus without complications: Secondary | ICD-10-CM | POA: Diagnosis not present

## 2020-07-05 ENCOUNTER — Other Ambulatory Visit: Payer: Self-pay | Admitting: Internal Medicine

## 2020-07-05 DIAGNOSIS — Z1231 Encounter for screening mammogram for malignant neoplasm of breast: Secondary | ICD-10-CM

## 2020-07-27 DIAGNOSIS — E10649 Type 1 diabetes mellitus with hypoglycemia without coma: Secondary | ICD-10-CM | POA: Diagnosis not present

## 2020-08-01 DIAGNOSIS — E039 Hypothyroidism, unspecified: Secondary | ICD-10-CM | POA: Diagnosis not present

## 2020-08-01 DIAGNOSIS — E785 Hyperlipidemia, unspecified: Secondary | ICD-10-CM | POA: Diagnosis not present

## 2020-08-01 DIAGNOSIS — E1069 Type 1 diabetes mellitus with other specified complication: Secondary | ICD-10-CM | POA: Diagnosis not present

## 2020-10-08 DIAGNOSIS — E039 Hypothyroidism, unspecified: Secondary | ICD-10-CM | POA: Diagnosis not present

## 2020-10-08 DIAGNOSIS — E109 Type 1 diabetes mellitus without complications: Secondary | ICD-10-CM | POA: Diagnosis not present

## 2020-10-16 DIAGNOSIS — E039 Hypothyroidism, unspecified: Secondary | ICD-10-CM | POA: Diagnosis not present

## 2020-10-16 DIAGNOSIS — E1069 Type 1 diabetes mellitus with other specified complication: Secondary | ICD-10-CM | POA: Diagnosis not present

## 2020-10-16 DIAGNOSIS — Z0001 Encounter for general adult medical examination with abnormal findings: Secondary | ICD-10-CM | POA: Diagnosis not present

## 2020-10-16 DIAGNOSIS — E109 Type 1 diabetes mellitus without complications: Secondary | ICD-10-CM | POA: Diagnosis not present

## 2020-10-16 DIAGNOSIS — Z13820 Encounter for screening for osteoporosis: Secondary | ICD-10-CM | POA: Diagnosis not present

## 2020-10-16 DIAGNOSIS — E785 Hyperlipidemia, unspecified: Secondary | ICD-10-CM | POA: Diagnosis not present

## 2020-11-01 DIAGNOSIS — E10649 Type 1 diabetes mellitus with hypoglycemia without coma: Secondary | ICD-10-CM | POA: Diagnosis not present

## 2020-11-01 DIAGNOSIS — M8588 Other specified disorders of bone density and structure, other site: Secondary | ICD-10-CM | POA: Diagnosis not present

## 2020-11-01 DIAGNOSIS — Z13820 Encounter for screening for osteoporosis: Secondary | ICD-10-CM | POA: Diagnosis not present

## 2020-11-17 DIAGNOSIS — E10649 Type 1 diabetes mellitus with hypoglycemia without coma: Secondary | ICD-10-CM | POA: Diagnosis not present

## 2020-11-21 ENCOUNTER — Ambulatory Visit
Admission: RE | Admit: 2020-11-21 | Discharge: 2020-11-21 | Disposition: A | Discharge status: Home / Self Care | Payer: PPO | Source: Ambulatory Visit | Attending: Internal Medicine | Admitting: Internal Medicine

## 2020-11-21 ENCOUNTER — Other Ambulatory Visit: Payer: Self-pay

## 2020-11-21 DIAGNOSIS — Z1231 Encounter for screening mammogram for malignant neoplasm of breast: Secondary | ICD-10-CM | POA: Diagnosis not present

## 2021-01-02 DIAGNOSIS — D225 Melanocytic nevi of trunk: Secondary | ICD-10-CM | POA: Diagnosis not present

## 2021-01-02 DIAGNOSIS — D2271 Melanocytic nevi of right lower limb, including hip: Secondary | ICD-10-CM | POA: Diagnosis not present

## 2021-01-02 DIAGNOSIS — D2262 Melanocytic nevi of left upper limb, including shoulder: Secondary | ICD-10-CM | POA: Diagnosis not present

## 2021-01-02 DIAGNOSIS — D2272 Melanocytic nevi of left lower limb, including hip: Secondary | ICD-10-CM | POA: Diagnosis not present

## 2021-01-02 DIAGNOSIS — D2261 Melanocytic nevi of right upper limb, including shoulder: Secondary | ICD-10-CM | POA: Diagnosis not present

## 2021-01-02 DIAGNOSIS — L821 Other seborrheic keratosis: Secondary | ICD-10-CM | POA: Diagnosis not present

## 2021-01-30 DIAGNOSIS — E10649 Type 1 diabetes mellitus with hypoglycemia without coma: Secondary | ICD-10-CM | POA: Diagnosis not present

## 2021-02-06 DIAGNOSIS — E039 Hypothyroidism, unspecified: Secondary | ICD-10-CM | POA: Diagnosis not present

## 2021-02-06 DIAGNOSIS — E785 Hyperlipidemia, unspecified: Secondary | ICD-10-CM | POA: Diagnosis not present

## 2021-02-06 DIAGNOSIS — E1069 Type 1 diabetes mellitus with other specified complication: Secondary | ICD-10-CM | POA: Diagnosis not present

## 2021-04-09 DIAGNOSIS — E039 Hypothyroidism, unspecified: Secondary | ICD-10-CM | POA: Diagnosis not present

## 2021-04-09 DIAGNOSIS — E109 Type 1 diabetes mellitus without complications: Secondary | ICD-10-CM | POA: Diagnosis not present

## 2021-04-16 DIAGNOSIS — J329 Chronic sinusitis, unspecified: Secondary | ICD-10-CM | POA: Diagnosis not present

## 2021-04-16 DIAGNOSIS — E039 Hypothyroidism, unspecified: Secondary | ICD-10-CM | POA: Diagnosis not present

## 2021-04-16 DIAGNOSIS — E785 Hyperlipidemia, unspecified: Secondary | ICD-10-CM | POA: Diagnosis not present

## 2021-04-16 DIAGNOSIS — E1069 Type 1 diabetes mellitus with other specified complication: Secondary | ICD-10-CM | POA: Diagnosis not present

## 2021-04-16 DIAGNOSIS — E538 Deficiency of other specified B group vitamins: Secondary | ICD-10-CM | POA: Diagnosis not present

## 2021-04-16 DIAGNOSIS — Z0000 Encounter for general adult medical examination without abnormal findings: Secondary | ICD-10-CM | POA: Diagnosis not present

## 2021-06-11 DIAGNOSIS — B029 Zoster without complications: Secondary | ICD-10-CM | POA: Diagnosis not present

## 2021-06-28 DIAGNOSIS — E113293 Type 2 diabetes mellitus with mild nonproliferative diabetic retinopathy without macular edema, bilateral: Secondary | ICD-10-CM | POA: Diagnosis not present

## 2021-06-28 DIAGNOSIS — Z961 Presence of intraocular lens: Secondary | ICD-10-CM | POA: Diagnosis not present

## 2021-08-15 DIAGNOSIS — E1069 Type 1 diabetes mellitus with other specified complication: Secondary | ICD-10-CM | POA: Diagnosis not present

## 2021-08-15 DIAGNOSIS — E039 Hypothyroidism, unspecified: Secondary | ICD-10-CM | POA: Diagnosis not present

## 2021-08-15 DIAGNOSIS — E785 Hyperlipidemia, unspecified: Secondary | ICD-10-CM | POA: Diagnosis not present

## 2021-08-29 DIAGNOSIS — E10649 Type 1 diabetes mellitus with hypoglycemia without coma: Secondary | ICD-10-CM | POA: Diagnosis not present

## 2021-09-29 DIAGNOSIS — E10649 Type 1 diabetes mellitus with hypoglycemia without coma: Secondary | ICD-10-CM | POA: Diagnosis not present

## 2021-10-16 DIAGNOSIS — E039 Hypothyroidism, unspecified: Secondary | ICD-10-CM | POA: Diagnosis not present

## 2021-10-16 DIAGNOSIS — E109 Type 1 diabetes mellitus without complications: Secondary | ICD-10-CM | POA: Diagnosis not present

## 2021-10-16 DIAGNOSIS — E10649 Type 1 diabetes mellitus with hypoglycemia without coma: Secondary | ICD-10-CM | POA: Diagnosis not present

## 2021-10-22 ENCOUNTER — Other Ambulatory Visit: Payer: Self-pay | Admitting: Internal Medicine

## 2021-10-22 DIAGNOSIS — R0789 Other chest pain: Secondary | ICD-10-CM | POA: Diagnosis not present

## 2021-10-22 DIAGNOSIS — Z1231 Encounter for screening mammogram for malignant neoplasm of breast: Secondary | ICD-10-CM

## 2021-10-22 DIAGNOSIS — E538 Deficiency of other specified B group vitamins: Secondary | ICD-10-CM | POA: Diagnosis not present

## 2021-10-22 DIAGNOSIS — E109 Type 1 diabetes mellitus without complications: Secondary | ICD-10-CM | POA: Diagnosis not present

## 2021-10-22 DIAGNOSIS — E785 Hyperlipidemia, unspecified: Secondary | ICD-10-CM | POA: Diagnosis not present

## 2021-10-22 DIAGNOSIS — E1069 Type 1 diabetes mellitus with other specified complication: Secondary | ICD-10-CM | POA: Diagnosis not present

## 2021-10-22 DIAGNOSIS — E039 Hypothyroidism, unspecified: Secondary | ICD-10-CM | POA: Diagnosis not present

## 2021-10-29 DIAGNOSIS — E10649 Type 1 diabetes mellitus with hypoglycemia without coma: Secondary | ICD-10-CM | POA: Diagnosis not present

## 2021-11-04 DIAGNOSIS — F411 Generalized anxiety disorder: Secondary | ICD-10-CM | POA: Diagnosis not present

## 2021-11-04 DIAGNOSIS — R110 Nausea: Secondary | ICD-10-CM | POA: Diagnosis not present

## 2021-11-04 DIAGNOSIS — R1013 Epigastric pain: Secondary | ICD-10-CM | POA: Diagnosis not present

## 2021-11-04 DIAGNOSIS — E039 Hypothyroidism, unspecified: Secondary | ICD-10-CM | POA: Diagnosis not present

## 2021-11-15 DIAGNOSIS — E10649 Type 1 diabetes mellitus with hypoglycemia without coma: Secondary | ICD-10-CM | POA: Diagnosis not present

## 2021-11-22 ENCOUNTER — Ambulatory Visit
Admission: RE | Admit: 2021-11-22 | Discharge: 2021-11-22 | Disposition: A | Discharge status: Home / Self Care | Payer: PPO | Source: Ambulatory Visit | Attending: Internal Medicine | Admitting: Internal Medicine

## 2021-11-22 ENCOUNTER — Other Ambulatory Visit: Payer: Self-pay

## 2021-11-22 DIAGNOSIS — Z1231 Encounter for screening mammogram for malignant neoplasm of breast: Secondary | ICD-10-CM | POA: Diagnosis not present

## 2021-11-29 DIAGNOSIS — E10649 Type 1 diabetes mellitus with hypoglycemia without coma: Secondary | ICD-10-CM | POA: Diagnosis not present

## 2021-12-02 DIAGNOSIS — E10649 Type 1 diabetes mellitus with hypoglycemia without coma: Secondary | ICD-10-CM | POA: Diagnosis not present

## 2021-12-06 DIAGNOSIS — L298 Other pruritus: Secondary | ICD-10-CM | POA: Diagnosis not present

## 2021-12-06 DIAGNOSIS — L820 Inflamed seborrheic keratosis: Secondary | ICD-10-CM | POA: Diagnosis not present

## 2021-12-06 DIAGNOSIS — Z789 Other specified health status: Secondary | ICD-10-CM | POA: Diagnosis not present

## 2021-12-11 DIAGNOSIS — E785 Hyperlipidemia, unspecified: Secondary | ICD-10-CM | POA: Diagnosis not present

## 2021-12-11 DIAGNOSIS — E109 Type 1 diabetes mellitus without complications: Secondary | ICD-10-CM | POA: Diagnosis not present

## 2021-12-11 DIAGNOSIS — E039 Hypothyroidism, unspecified: Secondary | ICD-10-CM | POA: Diagnosis not present

## 2021-12-11 DIAGNOSIS — E1069 Type 1 diabetes mellitus with other specified complication: Secondary | ICD-10-CM | POA: Diagnosis not present

## 2021-12-17 DIAGNOSIS — E10649 Type 1 diabetes mellitus with hypoglycemia without coma: Secondary | ICD-10-CM | POA: Diagnosis not present

## 2021-12-30 DIAGNOSIS — E10649 Type 1 diabetes mellitus with hypoglycemia without coma: Secondary | ICD-10-CM | POA: Diagnosis not present

## 2022-01-02 DIAGNOSIS — D2262 Melanocytic nevi of left upper limb, including shoulder: Secondary | ICD-10-CM | POA: Diagnosis not present

## 2022-01-02 DIAGNOSIS — D225 Melanocytic nevi of trunk: Secondary | ICD-10-CM | POA: Diagnosis not present

## 2022-01-02 DIAGNOSIS — L821 Other seborrheic keratosis: Secondary | ICD-10-CM | POA: Diagnosis not present

## 2022-01-02 DIAGNOSIS — D485 Neoplasm of uncertain behavior of skin: Secondary | ICD-10-CM | POA: Diagnosis not present

## 2022-01-02 DIAGNOSIS — Z85828 Personal history of other malignant neoplasm of skin: Secondary | ICD-10-CM | POA: Diagnosis not present

## 2022-01-02 DIAGNOSIS — L820 Inflamed seborrheic keratosis: Secondary | ICD-10-CM | POA: Diagnosis not present

## 2022-01-02 DIAGNOSIS — D2261 Melanocytic nevi of right upper limb, including shoulder: Secondary | ICD-10-CM | POA: Diagnosis not present

## 2022-01-02 DIAGNOSIS — C44622 Squamous cell carcinoma of skin of right upper limb, including shoulder: Secondary | ICD-10-CM | POA: Diagnosis not present

## 2022-01-16 DIAGNOSIS — E10649 Type 1 diabetes mellitus with hypoglycemia without coma: Secondary | ICD-10-CM | POA: Diagnosis not present

## 2022-01-27 DIAGNOSIS — E10649 Type 1 diabetes mellitus with hypoglycemia without coma: Secondary | ICD-10-CM | POA: Diagnosis not present

## 2022-02-17 DIAGNOSIS — E10649 Type 1 diabetes mellitus with hypoglycemia without coma: Secondary | ICD-10-CM | POA: Diagnosis not present

## 2022-02-27 DIAGNOSIS — E10649 Type 1 diabetes mellitus with hypoglycemia without coma: Secondary | ICD-10-CM | POA: Diagnosis not present

## 2022-03-04 ENCOUNTER — Ambulatory Visit: Payer: PPO | Admitting: Podiatry

## 2022-03-04 DIAGNOSIS — L84 Corns and callosities: Secondary | ICD-10-CM

## 2022-03-04 DIAGNOSIS — E10649 Type 1 diabetes mellitus with hypoglycemia without coma: Secondary | ICD-10-CM | POA: Diagnosis not present

## 2022-03-06 NOTE — Progress Notes (Signed)
?Subjective:  ?Patient ID: Elizabeth [name], female    DOB: 1941-08-21,  MRN: [id number] ? ?Chief Complaint  ?Patient presents with  ? Toe Pain  ? ? ?81 y.o. female presents with the above complaint.  Patient presents with new complaint of left fourth fifth digit heloma molle.  Patient states been present for quite some time is progressive gotten worse.  She has not seen anyone else prior to seeing me for this.  She denies any other acute complaints.  She has tried getting making some modification to her shoes which has not helped.  Pain scale is 5 out of 10 dull aching nature.  Hurts with ambulation ? ? ?Review of Systems: Negative except as noted in the HPI. Denies N/V/F/Ch. ? ?Past Medical History:  ?Diagnosis Date  ? Anemia   ? Arthritis   ? Asthma   ? with cough/cold but nothing now  ? Chronic kidney disease   ? due to type 1 diabetes  ? Diabetes mellitus without complication ([place]) 4132  ? on insulin pump. VERY BRITTLE. per patient, it can drop 140 points in an hour  ? GERD (gastroesophageal reflux disease)   ? H/O  ? Heart murmur   ? not followed by anyone  ? Hx of cervical spine surgery   ? Hyperlipidemia   ? Hypothyroidism   ? Insulin pump in place   ? PONV (postoperative nausea and vomiting)   ? NO PROBLEMS SINCE USING ZOFRAN INTRAOP  ? ? ?Current Outpatient Medications:  ?  Biotin 10000 MCG TABS, Take 10,000 mcg by mouth daily at 12 noon., Disp: , Rfl:  ?  Calcium Carb-Cholecalciferol (CALCIUM 600+D3 PO), Take 2 tablets by mouth daily., Disp: , Rfl:  ?  celecoxib (CELEBREX) 200 MG capsule, Take 1 capsule (200 mg total) by mouth 2 (two) times daily., Disp: 60 capsule, Rfl: 1 ?  diphenhydrAMINE (SOMINEX) 25 MG tablet, Take 25 mg by mouth at bedtime. , Disp: , Rfl:  ?  enoxaparin (LOVENOX) 40 MG/0.4ML injection, Inject 0.4 mLs (40 mg total) into the skin daily., Disp: 14 Syringe, Rfl: 0 ?  glucagon 1 MG injection, Inject 1 mg into the muscle daily as needed (for low blood sugars). Reported on 02/21/2016,  Disp: , Rfl:  ?  glucose 5 g chewable tablet, Chew 15 g by mouth 3 (three) times daily as needed for low blood sugar. , Disp: , Rfl:  ?  hydroxypropyl methylcellulose / hypromellose (ISOPTO TEARS / GONIOVISC) 2.5 % ophthalmic solution, Place 1-2 drops into both eyes 3 (three) times daily as needed for dry eyes., Disp: , Rfl:  ?  Insulin Human (INSULIN PUMP) SOLN, Inject into the skin. NOVOLOG 100 UNIT/ML injection, Disp: , Rfl:  ?  levothyroxine (SYNTHROID, LEVOTHROID) 100 MCG tablet, Take 100 mcg by mouth daily before breakfast. , Disp: , Rfl:  ?  Melatonin 10 MG TABS, Take 10 mg by mouth at bedtime., Disp: , Rfl:  ?  oxyCODONE (OXY IR/ROXICODONE) 5 MG immediate release tablet, Take 1 tablet (5 mg total) by mouth every 4 (four) hours as needed for moderate pain (pain score 4-6)., Disp: 40 tablet, Rfl: 0 ?  promethazine (PHENERGAN) 25 MG tablet, Take 1 tablet (25 mg total) by mouth every 8 (eight) hours as needed. (Patient not taking: Reported on 01/20/2018), Disp: 20 tablet, Rfl: 0 ?  pseudoephedrine-guaifenesin (MUCINEX D) 60-600 MG 12 hr tablet, Take 1 tablet by mouth 2 (two) times daily as needed for congestion., Disp: , Rfl:  ?  rosuvastatin (CRESTOR) 10 MG tablet, Take 10 mg by mouth at bedtime. , Disp: , Rfl:  ?  traMADol (ULTRAM) 50 MG tablet, Take 1-2 tablets (50-100 mg total) by mouth every 4 (four) hours as needed for moderate pain., Disp: 40 tablet, Rfl: 1 ?  vitamin B-12 (CYANOCOBALAMIN) 1000 MCG tablet, Take 1,000 mcg by mouth daily at 12 noon., Disp: , Rfl:  ? ?Social History  ? ?Tobacco Use  ?Smoking Status Never  ?Smokeless Tobacco Never  ? ? ?Allergies  ?Allergen Reactions  ? Neosporin [Neomycin-Bacitracin Zn-Polymyx] Other (See Comments)  ?  Skin rash or redness  ? Ceclor [Cefaclor] Other (See Comments)  ?  Unsure of reaction type  ? Other   ?  Other reaction(s): Other (See Comments) ?sulfasaline-Does not remember  ? Polysorbate Other (See Comments)  ? Relafen [Nabumetone] Other (See Comments)  ?   Unsure of reaction type  ? Sulfasalazine Other (See Comments)  ?  Unsure of reaction type  ? Amoxicillin Nausea And Vomiting  ?  Has patient had a PCN reaction causing immediate rash, facial/tongue/throat swelling, SOB or lightheadedness with hypotension: No ?Has patient had a PCN reaction causing severe rash involving mucus membranes or skin necrosis:No ?Has patient had a PCN reaction that required hospitalization: No ?Has patient had a PCN reaction occurring within the last 10 years: No ?If all of the above answers are "NO", then may proceed with Cephalosporin use. ?  ? Band-Aid Plus Antibiotic [Bacitracin-Polymyxin B] Other (See Comments)  ?  ADHESIVE, NOT ANTIBIOTIC ?Can use paper tape ?PULLS SKIN OFF ?Other reaction(s): Other (See Comments) ?PULLS SKIN OFF ?PULLS SKIN OFF  ? Clindamycin Rash  ? Niacin   ?  Other reaction(s): Other (See Comments) ?flushing  ? Niaspan [Niacin Er] Other (See Comments)  ?  FLUSHING  ? Penicillin V Potassium Other (See Comments)  ?  Has patient had a PCN reaction causing immediate rash, facial/tongue/throat swelling, SOB or lightheadedness with hypotension: Unknown ?Has patient had a PCN reaction causing severe rash involving mucus membranes or skin necrosis: Unknown ?Has patient had a PCN reaction that required hospitalization: No ?Has patient had a PCN reaction occurring within the last 10 years: No ?If all of the above answers are "NO", then may proceed with Cephalosporin use. ?  ? Penicillins Nausea And Vomiting  ?  Happened so long ago and patient is not sure of reaction  ? Statins Other (See Comments)  ?  Other reaction(s): Other (See Comments) ?arthralgia ?ARTHRALGIA ?arthralgia  ? ?Objective:  ?There were no vitals filed for this visit. ?There is no height or weight on file to calculate BMI. ?Constitutional Well developed. ?Well nourished.  ?Vascular Dorsalis pedis pulses palpable bilaterally. ?Posterior tibial pulses palpable bilaterally. ?Capillary refill normal to all  digits.  ?No cyanosis or clubbing noted. ?Pedal hair growth normal.  ?Neurologic Normal speech. ?Oriented to person, place, and time. ?Epicritic sensation to light touch grossly present bilaterally.  ?Dermatologic Heloma molle noted with hyperkeratotic lesion between the fourth and fifth digit.  Pain on palpation to the lesion underlying hammertoe contractures noted of the fourth and fifth digit  ?Orthopedic: Normal joint ROM without pain or crepitus bilaterally. ?No visible deformities. ?No bony tenderness.  ? ?Radiographs: None ?Assessment:  ? ?1. Heloma molle   ? ?Plan:  ?Patient was evaluated and treated and all questions answered. ? 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  I discussed this with the patient in extensive detail she states understanding.  If there is no improvement patient will need surgical intervention. ? ?No follow-ups on file. ?

## 2022-03-10 DIAGNOSIS — L905 Scar conditions and fibrosis of skin: Secondary | ICD-10-CM | POA: Diagnosis not present

## 2022-03-10 DIAGNOSIS — C44622 Squamous cell carcinoma of skin of right upper limb, including shoulder: Secondary | ICD-10-CM | POA: Diagnosis not present

## 2022-03-19 DIAGNOSIS — E10649 Type 1 diabetes mellitus with hypoglycemia without coma: Secondary | ICD-10-CM | POA: Diagnosis not present

## 2022-03-27 DIAGNOSIS — E785 Hyperlipidemia, unspecified: Secondary | ICD-10-CM | POA: Diagnosis not present

## 2022-03-27 DIAGNOSIS — E039 Hypothyroidism, unspecified: Secondary | ICD-10-CM | POA: Diagnosis not present

## 2022-03-27 DIAGNOSIS — E1069 Type 1 diabetes mellitus with other specified complication: Secondary | ICD-10-CM | POA: Diagnosis not present

## 2022-03-29 DIAGNOSIS — E10649 Type 1 diabetes mellitus with hypoglycemia without coma: Secondary | ICD-10-CM | POA: Diagnosis not present

## 2022-04-01 DIAGNOSIS — L923 Foreign body granuloma of the skin and subcutaneous tissue: Secondary | ICD-10-CM | POA: Diagnosis not present

## 2022-04-18 DIAGNOSIS — E039 Hypothyroidism, unspecified: Secondary | ICD-10-CM | POA: Diagnosis not present

## 2022-04-18 DIAGNOSIS — E10649 Type 1 diabetes mellitus with hypoglycemia without coma: Secondary | ICD-10-CM | POA: Diagnosis not present

## 2022-04-18 DIAGNOSIS — E109 Type 1 diabetes mellitus without complications: Secondary | ICD-10-CM | POA: Diagnosis not present

## 2022-04-22 DIAGNOSIS — E538 Deficiency of other specified B group vitamins: Secondary | ICD-10-CM | POA: Diagnosis not present

## 2022-04-22 DIAGNOSIS — E785 Hyperlipidemia, unspecified: Secondary | ICD-10-CM | POA: Diagnosis not present

## 2022-04-22 DIAGNOSIS — N179 Acute kidney failure, unspecified: Secondary | ICD-10-CM | POA: Diagnosis not present

## 2022-04-22 DIAGNOSIS — E039 Hypothyroidism, unspecified: Secondary | ICD-10-CM | POA: Diagnosis not present

## 2022-04-22 DIAGNOSIS — E1069 Type 1 diabetes mellitus with other specified complication: Secondary | ICD-10-CM | POA: Diagnosis not present

## 2022-04-22 DIAGNOSIS — Z0000 Encounter for general adult medical examination without abnormal findings: Secondary | ICD-10-CM | POA: Diagnosis not present

## 2022-04-29 DIAGNOSIS — E10649 Type 1 diabetes mellitus with hypoglycemia without coma: Secondary | ICD-10-CM | POA: Diagnosis not present

## 2022-04-30 DIAGNOSIS — L089 Local infection of the skin and subcutaneous tissue, unspecified: Secondary | ICD-10-CM | POA: Diagnosis not present

## 2022-05-12 DIAGNOSIS — E039 Hypothyroidism, unspecified: Secondary | ICD-10-CM | POA: Diagnosis not present

## 2022-05-19 DIAGNOSIS — E10649 Type 1 diabetes mellitus with hypoglycemia without coma: Secondary | ICD-10-CM | POA: Diagnosis not present

## 2022-05-29 DIAGNOSIS — E10649 Type 1 diabetes mellitus with hypoglycemia without coma: Secondary | ICD-10-CM | POA: Diagnosis not present

## 2022-06-02 DIAGNOSIS — E10649 Type 1 diabetes mellitus with hypoglycemia without coma: Secondary | ICD-10-CM | POA: Diagnosis not present

## 2022-06-18 DIAGNOSIS — E10649 Type 1 diabetes mellitus with hypoglycemia without coma: Secondary | ICD-10-CM | POA: Diagnosis not present

## 2022-06-23 DIAGNOSIS — D225 Melanocytic nevi of trunk: Secondary | ICD-10-CM | POA: Diagnosis not present

## 2022-06-23 DIAGNOSIS — Z85828 Personal history of other malignant neoplasm of skin: Secondary | ICD-10-CM | POA: Diagnosis not present

## 2022-06-23 DIAGNOSIS — L821 Other seborrheic keratosis: Secondary | ICD-10-CM | POA: Diagnosis not present

## 2022-06-23 DIAGNOSIS — L570 Actinic keratosis: Secondary | ICD-10-CM | POA: Diagnosis not present

## 2022-06-23 DIAGNOSIS — X32XXXA Exposure to sunlight, initial encounter: Secondary | ICD-10-CM | POA: Diagnosis not present

## 2022-06-23 DIAGNOSIS — Z08 Encounter for follow-up examination after completed treatment for malignant neoplasm: Secondary | ICD-10-CM | POA: Diagnosis not present

## 2022-06-30 ENCOUNTER — Other Ambulatory Visit: Payer: Self-pay | Admitting: Internal Medicine

## 2022-06-30 DIAGNOSIS — Z1231 Encounter for screening mammogram for malignant neoplasm of breast: Secondary | ICD-10-CM

## 2022-07-18 DIAGNOSIS — E10649 Type 1 diabetes mellitus with hypoglycemia without coma: Secondary | ICD-10-CM | POA: Diagnosis not present

## 2022-08-13 DIAGNOSIS — E109 Type 1 diabetes mellitus without complications: Secondary | ICD-10-CM | POA: Diagnosis not present

## 2022-08-13 DIAGNOSIS — Z20822 Contact with and (suspected) exposure to covid-19: Secondary | ICD-10-CM | POA: Diagnosis not present

## 2022-08-13 DIAGNOSIS — E785 Hyperlipidemia, unspecified: Secondary | ICD-10-CM | POA: Diagnosis not present

## 2022-08-13 DIAGNOSIS — E1069 Type 1 diabetes mellitus with other specified complication: Secondary | ICD-10-CM | POA: Diagnosis not present

## 2022-08-14 ENCOUNTER — Emergency Department: Payer: PPO

## 2022-08-14 ENCOUNTER — Inpatient Hospital Stay
Admission: EM | Admit: 2022-08-14 | Discharge: 2022-08-17 | DRG: 177 | Disposition: A | Discharge status: Home Health | Payer: PPO | Attending: Internal Medicine | Admitting: Internal Medicine

## 2022-08-14 ENCOUNTER — Other Ambulatory Visit: Payer: Self-pay

## 2022-08-14 DIAGNOSIS — E0590 Thyrotoxicosis, unspecified without thyrotoxic crisis or storm: Secondary | ICD-10-CM | POA: Diagnosis not present

## 2022-08-14 DIAGNOSIS — E1100 Type 2 diabetes mellitus with hyperosmolarity without nonketotic hyperglycemic-hyperosmolar coma (NKHHC): Secondary | ICD-10-CM

## 2022-08-14 DIAGNOSIS — Z91048 Other nonmedicinal substance allergy status: Secondary | ICD-10-CM | POA: Diagnosis not present

## 2022-08-14 DIAGNOSIS — E875 Hyperkalemia: Secondary | ICD-10-CM | POA: Diagnosis not present

## 2022-08-14 DIAGNOSIS — Z79899 Other long term (current) drug therapy: Secondary | ICD-10-CM

## 2022-08-14 DIAGNOSIS — A419 Sepsis, unspecified organism: Secondary | ICD-10-CM | POA: Diagnosis not present

## 2022-08-14 DIAGNOSIS — U071 COVID-19: Principal | ICD-10-CM | POA: Diagnosis present

## 2022-08-14 DIAGNOSIS — R578 Other shock: Secondary | ICD-10-CM | POA: Diagnosis not present

## 2022-08-14 DIAGNOSIS — R42 Dizziness and giddiness: Secondary | ICD-10-CM | POA: Diagnosis not present

## 2022-08-14 DIAGNOSIS — Z881 Allergy status to other antibiotic agents status: Secondary | ICD-10-CM | POA: Diagnosis not present

## 2022-08-14 DIAGNOSIS — Z794 Long term (current) use of insulin: Secondary | ICD-10-CM

## 2022-08-14 DIAGNOSIS — E785 Hyperlipidemia, unspecified: Secondary | ICD-10-CM | POA: Diagnosis present

## 2022-08-14 DIAGNOSIS — W19XXXA Unspecified fall, initial encounter: Secondary | ICD-10-CM | POA: Diagnosis not present

## 2022-08-14 DIAGNOSIS — N1831 Chronic kidney disease, stage 3a: Secondary | ICD-10-CM

## 2022-08-14 DIAGNOSIS — R0902 Hypoxemia: Secondary | ICD-10-CM | POA: Diagnosis not present

## 2022-08-14 DIAGNOSIS — J45909 Unspecified asthma, uncomplicated: Secondary | ICD-10-CM | POA: Diagnosis not present

## 2022-08-14 DIAGNOSIS — Z9641 Presence of insulin pump (external) (internal): Secondary | ICD-10-CM | POA: Diagnosis not present

## 2022-08-14 DIAGNOSIS — N179 Acute kidney failure, unspecified: Secondary | ICD-10-CM | POA: Diagnosis not present

## 2022-08-14 DIAGNOSIS — Z79890 Hormone replacement therapy (postmenopausal): Secondary | ICD-10-CM

## 2022-08-14 DIAGNOSIS — K219 Gastro-esophageal reflux disease without esophagitis: Secondary | ICD-10-CM | POA: Diagnosis present

## 2022-08-14 DIAGNOSIS — I129 Hypertensive chronic kidney disease with stage 1 through stage 4 chronic kidney disease, or unspecified chronic kidney disease: Secondary | ICD-10-CM | POA: Diagnosis not present

## 2022-08-14 DIAGNOSIS — E538 Deficiency of other specified B group vitamins: Secondary | ICD-10-CM | POA: Diagnosis present

## 2022-08-14 DIAGNOSIS — N281 Cyst of kidney, acquired: Secondary | ICD-10-CM | POA: Diagnosis not present

## 2022-08-14 DIAGNOSIS — Z882 Allergy status to sulfonamides status: Secondary | ICD-10-CM | POA: Diagnosis not present

## 2022-08-14 DIAGNOSIS — E1165 Type 2 diabetes mellitus with hyperglycemia: Secondary | ICD-10-CM | POA: Diagnosis not present

## 2022-08-14 DIAGNOSIS — Z888 Allergy status to other drugs, medicaments and biological substances status: Secondary | ICD-10-CM | POA: Diagnosis not present

## 2022-08-14 DIAGNOSIS — Z880 Allergy status to penicillin: Secondary | ICD-10-CM

## 2022-08-14 DIAGNOSIS — E1122 Type 2 diabetes mellitus with diabetic chronic kidney disease: Secondary | ICD-10-CM | POA: Diagnosis not present

## 2022-08-14 DIAGNOSIS — R739 Hyperglycemia, unspecified: Secondary | ICD-10-CM | POA: Diagnosis not present

## 2022-08-14 DIAGNOSIS — J9601 Acute respiratory failure with hypoxia: Secondary | ICD-10-CM | POA: Diagnosis not present

## 2022-08-14 DIAGNOSIS — R55 Syncope and collapse: Secondary | ICD-10-CM | POA: Diagnosis not present

## 2022-08-14 DIAGNOSIS — Z96652 Presence of left artificial knee joint: Secondary | ICD-10-CM | POA: Diagnosis present

## 2022-08-14 DIAGNOSIS — J439 Emphysema, unspecified: Secondary | ICD-10-CM | POA: Diagnosis not present

## 2022-08-14 DIAGNOSIS — E039 Hypothyroidism, unspecified: Secondary | ICD-10-CM | POA: Diagnosis not present

## 2022-08-14 DIAGNOSIS — J479 Bronchiectasis, uncomplicated: Secondary | ICD-10-CM | POA: Diagnosis not present

## 2022-08-14 DIAGNOSIS — N170 Acute kidney failure with tubular necrosis: Secondary | ICD-10-CM | POA: Diagnosis not present

## 2022-08-14 HISTORY — DX: COVID-19: U07.1

## 2022-08-14 LAB — BASIC METABOLIC PANEL
Anion gap: 22 — ABNORMAL HIGH (ref 5–15)
Anion gap: 14 (ref 5–15)
Anion gap: 15 (ref 5–15)
BUN: 44 mg/dL — ABNORMAL HIGH (ref 8–23)
BUN: 45 mg/dL — ABNORMAL HIGH (ref 8–23)
BUN: 45 mg/dL — ABNORMAL HIGH (ref 8–23)
CO2: 19 mmol/L — ABNORMAL LOW (ref 22–32)
CO2: 20 mmol/L — ABNORMAL LOW (ref 22–32)
CO2: 22 mmol/L (ref 22–32)
Calcium: 8.6 mg/dL — ABNORMAL LOW (ref 8.9–10.3)
Calcium: 8.2 mg/dL — ABNORMAL LOW (ref 8.9–10.3)
Calcium: 8.5 mg/dL — ABNORMAL LOW (ref 8.9–10.3)
Chloride: 86 mmol/L — ABNORMAL LOW (ref 98–111)
Chloride: 98 mmol/L (ref 98–111)
Chloride: 99 mmol/L (ref 98–111)
Creatinine, Ser: 2.57 mg/dL — ABNORMAL HIGH (ref 0.44–1.00)
Creatinine, Ser: 2.16 mg/dL — ABNORMAL HIGH (ref 0.44–1.00)
Creatinine, Ser: 2.13 mg/dL — ABNORMAL HIGH (ref 0.44–1.00)
GFR, Estimated: 18 mL/min — ABNORMAL LOW (ref >60)
GFR, Estimated: 22 mL/min — ABNORMAL LOW (ref >60)
GFR, Estimated: 23 mL/min — ABNORMAL LOW (ref >60)
Glucose, Bld: 904 mg/dL (ref 70–99)
Glucose, Bld: 545 mg/dL (ref 70–99)
Glucose, Bld: 318 mg/dL — ABNORMAL HIGH (ref 70–99)
Potassium: 5.9 mmol/L — ABNORMAL HIGH (ref 3.5–5.1)
Potassium: 4.2 mmol/L (ref 3.5–5.1)
Potassium: 4 mmol/L (ref 3.5–5.1)
Sodium: 127 mmol/L — ABNORMAL LOW (ref 135–145)
Sodium: 132 mmol/L — ABNORMAL LOW (ref 135–145)
Sodium: 136 mmol/L (ref 135–145)

## 2022-08-14 LAB — URINALYSIS, ROUTINE W REFLEX MICROSCOPIC
Bacteria, UA: NONE SEEN
Bilirubin Urine: NEGATIVE
Glucose, UA: >=500 mg/dL — AB
Hgb urine dipstick: NEGATIVE
Ketones, ur: 5 mg/dL — AB
Leukocytes,Ua: NEGATIVE
Nitrite: NEGATIVE
Protein, ur: NEGATIVE mg/dL
Specific Gravity, Urine: 1.017 (ref 1.005–1.030)
Squamous Epithelial / HPF: NONE SEEN (ref 0–5)
pH: 6 (ref 5.0–8.0)

## 2022-08-14 LAB — CBC WITH DIFFERENTIAL/PLATELET
Abs Immature Granulocytes: 0.07 10*3/uL (ref 0.00–0.07)
Basophils Absolute: 0 10*3/uL (ref 0.0–0.1)
Basophils Relative: 0 %
Eosinophils Absolute: 0 10*3/uL (ref 0.0–0.5)
Eosinophils Relative: 0 %
HCT: 44.3 % (ref 36.0–46.0)
Hemoglobin: 14.3 g/dL (ref 12.0–15.0)
Immature Granulocytes: 1 %
Lymphocytes Relative: 3 %
Lymphs Abs: 0.4 10*3/uL — ABNORMAL LOW (ref 0.7–4.0)
MCH: 29.3 pg (ref 26.0–34.0)
MCHC: 32.3 g/dL (ref 30.0–36.0)
MCV: 90.8 fL (ref 80.0–100.0)
Monocytes Absolute: 0.5 10*3/uL (ref 0.1–1.0)
Monocytes Relative: 4 %
Neutro Abs: 10.4 10*3/uL — ABNORMAL HIGH (ref 1.7–7.7)
Neutrophils Relative %: 92 %
Platelets: 252 10*3/uL (ref 150–400)
RBC: 4.88 MIL/uL (ref 3.87–5.11)
RDW: 13.7 % (ref 11.5–15.5)
WBC: 11.3 10*3/uL — ABNORMAL HIGH (ref 4.0–10.5)
nRBC: 0 % (ref 0.0–0.2)

## 2022-08-14 LAB — COMPREHENSIVE METABOLIC PANEL
ALT: 24 U/L (ref 0–44)
AST: 42 U/L — ABNORMAL HIGH (ref 15–41)
Albumin: 3.7 g/dL (ref 3.5–5.0)
Alkaline Phosphatase: 94 U/L (ref 38–126)
Anion gap: 19 — ABNORMAL HIGH (ref 5–15)
BUN: 41 mg/dL — ABNORMAL HIGH (ref 8–23)
CO2: 21 mmol/L — ABNORMAL LOW (ref 22–32)
Calcium: 8.6 mg/dL — ABNORMAL LOW (ref 8.9–10.3)
Chloride: 87 mmol/L — ABNORMAL LOW (ref 98–111)
Creatinine, Ser: 2.44 mg/dL — ABNORMAL HIGH (ref 0.44–1.00)
GFR, Estimated: 19 mL/min — ABNORMAL LOW (ref >60)
Glucose, Bld: 894 mg/dL (ref 70–99)
Potassium: 6.2 mmol/L — ABNORMAL HIGH (ref 3.5–5.1)
Sodium: 127 mmol/L — ABNORMAL LOW (ref 135–145)
Total Bilirubin: 2.8 mg/dL — ABNORMAL HIGH (ref 0.3–1.2)
Total Protein: 7.1 g/dL (ref 6.5–8.1)

## 2022-08-14 LAB — PHOSPHORUS: Phosphorus: 4.8 mg/dL — ABNORMAL HIGH (ref 2.5–4.6)

## 2022-08-14 LAB — LACTIC ACID, PLASMA
Lactic Acid, Venous: 5.5 mmol/L (ref 0.5–1.9)
Lactic Acid, Venous: 5.9 mmol/L (ref 0.5–1.9)

## 2022-08-14 LAB — GLUCOSE, CAPILLARY
Glucose-Capillary: >600 mg/dL (ref 70–99)
Glucose-Capillary: 594 mg/dL (ref 70–99)
Glucose-Capillary: >600 mg/dL (ref 70–99)
Glucose-Capillary: 507 mg/dL (ref 70–99)
Glucose-Capillary: 458 mg/dL — ABNORMAL HIGH (ref 70–99)
Glucose-Capillary: 476 mg/dL — ABNORMAL HIGH (ref 70–99)
Glucose-Capillary: 399 mg/dL — ABNORMAL HIGH (ref 70–99)
Glucose-Capillary: 396 mg/dL — ABNORMAL HIGH (ref 70–99)
Glucose-Capillary: 283 mg/dL — ABNORMAL HIGH (ref 70–99)
Glucose-Capillary: 235 mg/dL — ABNORMAL HIGH (ref 70–99)
Glucose-Capillary: 186 mg/dL — ABNORMAL HIGH (ref 70–99)
Glucose-Capillary: 187 mg/dL — ABNORMAL HIGH (ref 70–99)

## 2022-08-14 LAB — MAGNESIUM: Magnesium: 1.9 mg/dL (ref 1.7–2.4)

## 2022-08-14 LAB — BRAIN NATRIURETIC PEPTIDE: B Natriuretic Peptide: 285.5 pg/mL — ABNORMAL HIGH (ref 0.0–100.0)

## 2022-08-14 LAB — BLOOD GAS, VENOUS
Bicarbonate: 24.7 mmol/L (ref 20.0–28.0)
Patient temperature: 37
pCO2, Ven: 49 mmHg (ref 44–60)
pH, Ven: 7.31 (ref 7.25–7.43)
pO2, Ven: 33 mmHg (ref 32–45)

## 2022-08-14 LAB — MRSA NEXT GEN BY PCR, NASAL: MRSA by PCR Next Gen: NOT DETECTED

## 2022-08-14 LAB — TROPONIN I (HIGH SENSITIVITY)
Troponin I (High Sensitivity): 20 ng/L — ABNORMAL HIGH (ref <18)
Troponin I (High Sensitivity): 115 ng/L (ref <18)

## 2022-08-14 LAB — CBG MONITORING, ED: Glucose-Capillary: >600 mg/dL (ref 70–99)

## 2022-08-14 LAB — OSMOLALITY: Osmolality: 329 mOsm/kg (ref 275–295)

## 2022-08-14 LAB — STREP PNEUMONIAE URINARY ANTIGEN: Strep Pneumo Urinary Antigen: NEGATIVE

## 2022-08-14 MED ORDER — PHENOL 1.4 % MT LIQD
1.0000 | OROMUCOSAL | Status: DC | PRN
  Filled 2022-08-14: qty 177

## 2022-08-14 MED ORDER — DEXTROSE 50 % IV SOLN: 0.0000 mL | INTRAVENOUS | Status: DC | PRN

## 2022-08-14 MED ORDER — ACETAMINOPHEN 325 MG PO TABS
650.0000 mg | ORAL_TABLET | Freq: Four times a day (QID) | ORAL | Status: DC | PRN
  Administered 2022-08-14 – 2022-08-16 (×4): 650 mg via ORAL
  Filled 2022-08-14 (×4): qty 2

## 2022-08-14 MED ORDER — PHENTOLAMINE MESYLATE 5 MG IJ SOLR
5.0000 mg | Freq: Once | INTRAMUSCULAR | Status: AC
  Administered 2022-08-14: 5 mg via SUBCUTANEOUS
  Filled 2022-08-14: qty 5

## 2022-08-14 MED ORDER — INSULIN REGULAR(HUMAN) IN NACL 100-0.9 UT/100ML-% IV SOLN
INTRAVENOUS | Status: DC
  Administered 2022-08-14: 6.5 [IU]/h via INTRAVENOUS
  Filled 2022-08-14: qty 100

## 2022-08-14 MED ORDER — LACTATED RINGERS IV SOLN: INTRAVENOUS | Status: DC

## 2022-08-14 MED ORDER — DEXTROSE IN LACTATED RINGERS 5 % IV SOLN
INTRAVENOUS | Status: DC
  Administered 2022-08-14: 125 mL/h via INTRAVENOUS

## 2022-08-14 MED ORDER — LACTATED RINGERS IV BOLUS
20.0000 mL/kg | Freq: Once | INTRAVENOUS | Status: AC
  Administered 2022-08-14: 1434 mL via INTRAVENOUS

## 2022-08-14 MED ORDER — DOCUSATE SODIUM 100 MG PO CAPS: 100.0000 mg | ORAL_CAPSULE | Freq: Two times a day (BID) | ORAL | Status: DC | PRN

## 2022-08-14 MED ORDER — HEPARIN SODIUM (PORCINE) 5000 UNIT/ML IJ SOLN
5000.0000 [IU] | Freq: Three times a day (TID) | INTRAMUSCULAR | Status: DC
  Administered 2022-08-14 – 2022-08-17 (×9): 5000 [IU] via SUBCUTANEOUS
  Filled 2022-08-14 (×7): qty 1

## 2022-08-14 MED ORDER — CHLORHEXIDINE GLUCONATE CLOTH 2 % EX PADS
6.0000 | MEDICATED_PAD | Freq: Every day | CUTANEOUS | Status: DC
  Administered 2022-08-14 – 2022-08-16 (×3): 6 via TOPICAL

## 2022-08-14 MED ORDER — NOREPINEPHRINE 4 MG/250ML-% IV SOLN
0.0000 ug/min | INTRAVENOUS | Status: DC
  Administered 2022-08-14: 2 ug/min via INTRAVENOUS
  Filled 2022-08-14 (×2): qty 250

## 2022-08-14 MED ORDER — NITROGLYCERIN 2 % TD OINT
1.0000 [in_us] | TOPICAL_OINTMENT | Freq: Three times a day (TID) | TRANSDERMAL | Status: AC
  Administered 2022-08-14 – 2022-08-15 (×4): 1 [in_us] via TOPICAL
  Filled 2022-08-14 (×4): qty 1

## 2022-08-14 MED ORDER — ORAL CARE MOUTH RINSE: 15.0000 mL | OROMUCOSAL | Status: DC | PRN

## 2022-08-14 MED ORDER — ONDANSETRON HCL 4 MG/2ML IJ SOLN
4.0000 mg | Freq: Once | INTRAMUSCULAR | Status: AC
  Administered 2022-08-14: 4 mg via INTRAVENOUS
  Filled 2022-08-14: qty 2

## 2022-08-14 MED ORDER — SODIUM CHLORIDE 0.9 % IV BOLUS
1000.0000 mL | Freq: Once | INTRAVENOUS | Status: AC
  Administered 2022-08-14: 1000 mL via INTRAVENOUS

## 2022-08-14 MED ORDER — POLYETHYLENE GLYCOL 3350 17 G PO PACK: 17.0000 g | PACK | Freq: Every day | ORAL | Status: DC | PRN

## 2022-08-14 NOTE — H&P (Signed)
CRITICAL CARE PROGRESS NOTE    Name: Elizabeth [name] MRN: [id number] DOB: 11/02/1941     LOS: 0   SUBJECTIVE FINDINGS & SIGNIFICANT EVENTS    Patient description:  This is a 81 year old pleasant female with a history of hypothyroidism, morbid obesity, osteoarthritis, diabetes with insulin dependence.  Patient has had sick contacts with family who has been ill with COVID-19 for the last 1 to 2 weeks.  She reports also feeling flulike illness and testing positive for COVID 1 week ago.  She had seen outpatient primary care for this infection and was prescribed antiviral Paxlovid just 1 day prior to arrival to emergency department.  Patient came in feeling weak with shortness of breath requiring 3 L/min nasal cannula to reach normoxia.  Patient denies abdominal pain, nausea vomiting diarrhea and has never been on oxygen previously.  In the ER was noted that she had AKI, severe hyperglycemia with venous blood gas showing normal pH consistent with findings of hyperosmolar hyperglycemia.  Patient was also shown to have transient hypotension with circulatory shock requiring vasopressor support.  PCCM team asked for admission due to shock.  Lines/tubes :   Microbiology/Sepsis markers: Results for orders placed or performed during the hospital encounter of 01/20/18  Surgical pcr screen     Status: None   Collection Time: 01/20/18  3:36 PM   Specimen: Nasal Mucosa; Nasal Swab  Result Value Ref Range Status   MRSA, PCR NEGATIVE NEGATIVE Final   Staphylococcus aureus NEGATIVE NEGATIVE Final    Comment: (NOTE) The Xpert SA Assay (FDA approved for NASAL specimens in patients 51 years of age and older), is one component of a comprehensive surveillance program. It is not intended to diagnose infection nor to guide or monitor  treatment. Performed at [facility], [address] [place], [place] [address]   Urine culture     Status: None   Collection Time: 01/20/18  3:36 PM   Specimen: Urine, Random  Result Value Ref Range Status   Specimen Description   Final    URINE, RANDOM Performed at [facility], [address], [place], [place] [address]    Special Requests   Final    Normal Performed at [facility], [address], [place], [place] [address]    Culture   Final    NO GROWTH Performed at [facility], [place] [address] [place],  [address]    Report Status 01/22/2018 FINAL  Final    Anti-infectives:  Anti-infectives (From admission, onward)    None        Consults: Treatment Team:  Ottie Glazier, MD    PAST MEDICAL HISTORY   Past Medical History:  Diagnosis Date   Anemia    Arthritis    Asthma    with cough/cold but nothing now   Chronic kidney disease    due to type 1 diabetes   Diabetes mellitus without complication ([place]) 0160   on insulin pump. VERY BRITTLE. per patient, it can drop 140 points in an hour   GERD (gastroesophageal reflux disease)    H/O   Heart murmur    not followed by anyone   Hx of cervical spine surgery    Hyperlipidemia    Hypothyroidism    Insulin pump in place    PONV (postoperative nausea and vomiting)    NO PROBLEMS SINCE USING ZOFRAN INTRAOP     SURGICAL HISTORY   Past Surgical History:  Procedure Laterality Date  ABDOMINAL HYSTERECTOMY  1972   BLADDER SURGERY  2003   leaky bladder   BREAST BIOPSY Left 2015   neg   CATARACT EXTRACTION W/PHACO Right 02/21/2016   Procedure: CATARACT EXTRACTION PHACO AND INTRAOCULAR LENS PLACEMENT (IOC);  Surgeon: Leandrew Koyanagi, MD;  Location: [facility] OR[facility];  Service: Ophthalmology;  Laterality: Right;  Lot# [id number] HUS: 00:50.6AP%:17.0CDE: 8.56   CATARACT EXTRACTION W/PHACO Left 03/26/2016   Procedure: CATARACT EXTRACTION PHACO AND INTRAOCULAR LENS PLACEMENT  ([place]) left eye;  Surgeon: Leandrew Koyanagi, MD;  Location: [facility];  Service: Ophthalmology;  Laterality: Left;  DIABETIC - insulin pump SYMFONY TORIC LENS   CHOLECYSTECTOMY  1995   COLONOSCOPY WITH PROPOFOL N/A 01/28/2016   Procedure: COLONOSCOPY WITH PROPOFOL;  Surgeon: Hulen Luster, MD;  Location: [facility];  Service: Gastroenterology;  Laterality: N/A;   KNEE ARTHROPLASTY Left 02/03/2018   Procedure: COMPUTER ASSISTED TOTAL KNEE ARTHROPLASTY;  Surgeon: Dereck Leep, MD;  Location: [facility] OR[facility];  Service: Orthopedics;  Laterality: Left;   KNEE ARTHROSCOPY Left 09/17/2015   Procedure: ARTHROSCOPY KNEE,partial medial menisectomy, chondroplasty medial patella femoral.;  Surgeon: Dereck Leep, MD;  Location: [facility] OR[facility];  Service: Orthopedics;  Laterality: Left;   mallet toe Bilateral 2013, 2018   2nd toes   MENISECTOMY Right    2011   NECK SURGERY  2010   2 discs.  has plate and screws in place   SEPTOPLASTY  1997     FAMILY HISTORY   Family History  Problem Relation Age of Onset   Breast cancer Maternal Aunt 78   Breast cancer Paternal Aunt 78     SOCIAL HISTORY   Social History   Tobacco Use   Smoking status: Never   Smokeless tobacco: Never  Vaping Use   Vaping Use: Never used  Substance Use Topics   Alcohol use: No    Alcohol/week: 0.0 standard drinks of alcohol   Drug use: No     MEDICATIONS   Current Medication:  Current Facility-Administered Medications:    dextrose 5 % in lactated ringers infusion, , Intravenous, Continuous, Bradler, Evan K, MD   dextrose 50 % solution 0-50 mL, 0-50 mL, Intravenous, PRN, Cheri Fowler, Vista Lawman, MD   docusate sodium (COLACE) capsule 100 mg, 100 mg, Oral, BID PRN, Ottie Glazier, MD   heparin injection 5,000 Units, 5,000 Units, Subcutaneous, Q8H, Aki Abalos, MD   insulin regular, human (MYXREDLIN) 100 units/ 100 mL infusion, , Intravenous, Continuous, Bradler, Vista Lawman, MD, Last Rate: 6.5 mL/hr at 08/14/22 1220,  6.5 Units/hr at 08/14/22 1220   lactated ringers infusion, , Intravenous, Continuous, Bradler, Vista Lawman, MD, Last Rate: 125 mL/hr at 08/14/22 1223, New Bag at 08/14/22 1223   norepinephrine (LEVOPHED) '4mg'$  in 237m (0.016 mg/mL) premix infusion, 0-40 mcg/min, Intravenous, Continuous, Bradler, EVista Lawman MD, Last Rate: 7.5 mL/hr at 08/14/22 1152, 2 mcg/min at 08/14/22 1152   polyethylene glycol (MIRALAX / GLYCOLAX) packet 17 g, 17 g, Oral, Daily PRN, AOttie Glazier MD  Current Outpatient Medications:    levothyroxine (SYNTHROID) 175 MCG tablet, Take 87.5 mcg by mouth every morning., Disp: , Rfl:    PAXLOVID, 150/100, 10 x 150 MG & 10 x '100MG'$  TBPK, Take by mouth., Disp: , Rfl:    rosuvastatin (CRESTOR) 10 MG tablet, Take 10 mg by mouth at bedtime. , Disp: , Rfl:    Biotin 10000 MCG TABS, Take 10,000 mcg by mouth daily at 12 noon., Disp: , Rfl:    Calcium Carb-Cholecalciferol (CALCIUM 600+D3 PO), Take 2 tablets  by mouth daily., Disp: , Rfl:    celecoxib (CELEBREX) 200 MG capsule, Take 1 capsule (200 mg total) by mouth 2 (two) times daily. (Patient not taking: Reported on 08/14/2022), Disp: 60 capsule, Rfl: 1   diphenhydrAMINE (SOMINEX) 25 MG tablet, Take 25 mg by mouth at bedtime.  (Patient not taking: Reported on 08/14/2022), Disp: , Rfl:    enoxaparin (LOVENOX) 40 MG/0.4ML injection, Inject 0.4 mLs (40 mg total) into the skin daily. (Patient not taking: Reported on 08/14/2022), Disp: 14 Syringe, Rfl: 0   glucagon 1 MG injection, Inject 1 mg into the muscle daily as needed (for low blood sugars). Reported on 02/21/2016, Disp: , Rfl:    glucose 5 g chewable tablet, Chew 15 g by mouth 3 (three) times daily as needed for low blood sugar. , Disp: , Rfl:    hydroxypropyl methylcellulose / hypromellose (ISOPTO TEARS / GONIOVISC) 2.5 % ophthalmic solution, Place 1-2 drops into both eyes 3 (three) times daily as needed for dry eyes. (Patient not taking: Reported on 08/14/2022), Disp: , Rfl:    Insulin Human (INSULIN  PUMP) SOLN, Inject into the skin. NOVOLOG 100 UNIT/ML injection, Disp: , Rfl:    levothyroxine (SYNTHROID, LEVOTHROID) 100 MCG tablet, Take 100 mcg by mouth daily before breakfast.  (Patient not taking: Reported on 08/14/2022), Disp: , Rfl:    Melatonin 10 MG TABS, Take 10 mg by mouth at bedtime., Disp: , Rfl:    NOVOLOG 100 UNIT/ML injection, SMARTSIG:0-60 Unit(s) SUB-Q Daily, Disp: , Rfl:    oxyCODONE (OXY IR/ROXICODONE) 5 MG immediate release tablet, Take 1 tablet (5 mg total) by mouth every 4 (four) hours as needed for moderate pain (pain score 4-6). (Patient not taking: Reported on 08/14/2022), Disp: 40 tablet, Rfl: 0   promethazine (PHENERGAN) 25 MG tablet, Take 1 tablet (25 mg total) by mouth every 8 (eight) hours as needed. (Patient not taking: Reported on 01/20/2018), Disp: 20 tablet, Rfl: 0   pseudoephedrine-guaifenesin (MUCINEX D) 60-600 MG 12 hr tablet, Take 1 tablet by mouth 2 (two) times daily as needed for congestion., Disp: , Rfl:    traMADol (ULTRAM) 50 MG tablet, Take 1-2 tablets (50-100 mg total) by mouth every 4 (four) hours as needed for moderate pain. (Patient not taking: Reported on 08/14/2022), Disp: 40 tablet, Rfl: 1   vitamin B-12 (CYANOCOBALAMIN) 1000 MCG tablet, Take 1,000 mcg by mouth daily at 12 noon., Disp: , Rfl:     ALLERGIES   Neosporin [neomycin-bacitracin zn-polymyx], Ceclor [cefaclor], Other, Polysorbate, Relafen [nabumetone], Sulfasalazine, Amoxicillin, Band-aid plus antibiotic [bacitracin-polymyxin b], Clindamycin, Niacin, Niaspan [niacin er], Penicillin v potassium, Penicillins, and Statins    REVIEW OF SYSTEMS    10 point ROS conducted and is negative for visual disturbances, chest pain, abd pain, and denies any additional symptoms except neck discomfort due to absence of pillow.   PHYSICAL EXAMINATION   Vital Signs: Temp:  [95.3 F (35.2 C)-96.5 F (35.8 C)] 95.3 F (35.2 C) (09/28 1218) Pulse Rate:  [67-94] 94 (09/28 1218) Resp:  [16-22] 16 (09/28  1218) BP: (87-116)/(31-52) 116/52 (09/28 1218) SpO2:  [92 %-95 %] 95 % (09/28 1218) Weight:  [71.7 kg] 71.7 kg (09/28 1000)  GENERAL:Age appropriate in NAD HEAD: Normocephalic, atraumatic.  EYES: Pupils equal, round, reactive to light.  No scleral icterus.  MOUTH: Moist mucosal membrane. NECK: Supple. No thyromegaly. No nodules. No JVD.  PULMONARY: mild Rhonchi bilatarally  CARDIOVASCULAR: S1 and S2. Regular rate and rhythm. No murmurs, rubs, or gallops.  GASTROINTESTINAL: Soft, nontender,  non-distended. No masses. Positive bowel sounds. No hepatosplenomegaly.  MUSCULOSKELETAL: No swelling, clubbing, or edema.  NEUROLOGIC: Mild distress due to acute illness SKIN:intact,warm,dry   PERTINENT DATA     Infusions:  dextrose 5% lactated ringers     insulin 6.5 Units/hr (08/14/22 1220)   lactated ringers 125 mL/hr at 08/14/22 1223   norepinephrine (LEVOPHED) Adult infusion 2 mcg/min (08/14/22 1152)   Scheduled Medications:  heparin  5,000 Units Subcutaneous Q8H   PRN Medications: dextrose, docusate sodium, polyethylene glycol Hemodynamic parameters:   Intake/Output: No intake/output data recorded.  Ventilator  Settings:   LAB RESULTS:  Basic Metabolic Panel: Recent Labs  Lab 08/14/22 0844 08/14/22 1121  NA 127* 127*  K 6.2* 5.9*  CL 87* 86*  CO2 21* 19*  GLUCOSE 894* 904*  BUN 41* 44*  CREATININE 2.44* 2.57*  CALCIUM 8.6* 8.6*   Liver Function Tests: Recent Labs  Lab 08/14/22 0844  AST 42*  ALT 24  ALKPHOS 94  BILITOT 2.8*  PROT 7.1  ALBUMIN 3.7   No results for input(s): "LIPASE", "AMYLASE" in the last 168 hours. No results for input(s): "AMMONIA" in the last 168 hours. CBC: Recent Labs  Lab 08/14/22 0844  WBC 11.3*  NEUTROABS 10.4*  HGB 14.3  HCT 44.3  MCV 90.8  PLT 252   Cardiac Enzymes: No results for input(s): "CKTOTAL", "CKMB", "CKMBINDEX", "TROPONINI" in the last 168 hours. BNP: Invalid input(s): "POCBNP" CBG: No results for  input(s): "GLUCAP" in the last 168 hours.     IMAGING RESULTS:  Imaging: CT CHEST ABDOMEN PELVIS WO CONTRAST  Result Date: 08/14/2022 CLINICAL DATA:  Sepsis.  COVID positive. EXAM: CT CHEST, ABDOMEN AND PELVIS WITHOUT CONTRAST TECHNIQUE: Multidetector CT imaging of the chest, abdomen and pelvis was performed following the standard protocol without IV contrast. RADIATION DOSE REDUCTION: This exam was performed according to the departmental dose-optimization program which includes automated exposure control, adjustment of the mA and/or kV according to patient size and/or use of iterative reconstruction technique. COMPARISON:  CT scan from 2010 FINDINGS: CT CHEST FINDINGS Cardiovascular: The heart is normal in size. No pericardial effusion. There is mild tortuosity and moderate atherosclerotic calcification of the thoracic aorta. No focal aneurysm. Three-vessel coronary artery calcifications are noted. Mediastinum/Nodes: No mediastinal or hilar mass or lymphadenopathy. Small scattered lymph nodes are noted. The esophagus is grossly normal. Lungs/Pleura: Azygous fissure is noted. Mild emphysematous changes with areas of paraseptal bulla. No pulmonary infiltrates or pleural effusions. No pulmonary edema. Patchy areas bronchiectasis. Musculoskeletal: No breast masses, supraclavicular or axillary adenopathy. The bony thorax is intact. CT ABDOMEN PELVIS FINDINGS Hepatobiliary: No hepatic lesions or intrahepatic biliary dilatation. The gallbladder is surgically absent. No common bile duct dilatation. Pancreas: No mass, inflammation or ductal dilatation. Spleen: Normal size.  No focal lesions. Adrenals/Urinary Tract: The adrenal glands are unremarkable. There is a rim calcified cyst projecting off the lower pole region of the right kidney. This measures a maximum of 3.8 cm and measures 19 Hounsfield units. This was present on the prior study from 2010 but measured 9 cm on that study. No further imaging evaluation  follow-up is necessary. No renal or obstructing ureteral calculi. The bladder contains a Foley catheter Stomach/Bowel: The stomach, duodenum small bowel and colon are grossly. No acute inflammatory process, mass lesions or obstructive findings. The terminal ileum and appendix are. Vascular/Lymphatic: Advanced atherosclerotic calcification involving the aorta and iliac arteries but no aneurysm. No mesenteric or retroperitoneal mass or adenopathy. Reproductive: Surgically absent. Other: No pelvic mass  or adenopathy. No free pelvic fluid collections. No inguinal mass or adenopathy. No abdominal wall hernia or subcutaneous lesions. Musculoskeletal: No significant bony findings. Moderate degenerative changes involving the lumbar spine. IMPRESSION: 1. No acute abdominal/pelvic findings, mass lesions or adenopathy. 2. Mild emphysematous changes with areas of paraseptal bulla. No pulmonary infiltrates or pleural effusions. 3. Status post cholecystectomy. No biliary dilatation. 4. Advanced atherosclerotic calcification involving the thoracic and abdominal aorta and iliac arteries. Electronically Signed   By: Marijo Sanes M.D.   On: 08/14/2022 12:20   DG Chest Port 1 View  Result Date: 08/14/2022 CLINICAL DATA:  Syncope EXAM: PORTABLE CHEST 1 VIEW COMPARISON:  01/23/2009 FINDINGS: The heart size and mediastinal contours are within normal limits. Both lungs are clear. The visualized skeletal structures are unremarkable. IMPRESSION: No active disease. Electronically Signed   By: Kathreen Devoid M.D.   On: 08/14/2022 10:00   '@PROBHOSP'$ @ CT CHEST ABDOMEN PELVIS WO CONTRAST  Result Date: 08/14/2022 CLINICAL DATA:  Sepsis.  COVID positive. EXAM: CT CHEST, ABDOMEN AND PELVIS WITHOUT CONTRAST TECHNIQUE: Multidetector CT imaging of the chest, abdomen and pelvis was performed following the standard protocol without IV contrast. RADIATION DOSE REDUCTION: This exam was performed according to the departmental dose-optimization  program which includes automated exposure control, adjustment of the mA and/or kV according to patient size and/or use of iterative reconstruction technique. COMPARISON:  CT scan from 2010 FINDINGS: CT CHEST FINDINGS Cardiovascular: The heart is normal in size. No pericardial effusion. There is mild tortuosity and moderate atherosclerotic calcification of the thoracic aorta. No focal aneurysm. Three-vessel coronary artery calcifications are noted. Mediastinum/Nodes: No mediastinal or hilar mass or lymphadenopathy. Small scattered lymph nodes are noted. The esophagus is grossly normal. Lungs/Pleura: Azygous fissure is noted. Mild emphysematous changes with areas of paraseptal bulla. No pulmonary infiltrates or pleural effusions. No pulmonary edema. Patchy areas bronchiectasis. Musculoskeletal: No breast masses, supraclavicular or axillary adenopathy. The bony thorax is intact. CT ABDOMEN PELVIS FINDINGS Hepatobiliary: No hepatic lesions or intrahepatic biliary dilatation. The gallbladder is surgically absent. No common bile duct dilatation. Pancreas: No mass, inflammation or ductal dilatation. Spleen: Normal size.  No focal lesions. Adrenals/Urinary Tract: The adrenal glands are unremarkable. There is a rim calcified cyst projecting off the lower pole region of the right kidney. This measures a maximum of 3.8 cm and measures 19 Hounsfield units. This was present on the prior study from 2010 but measured 9 cm on that study. No further imaging evaluation follow-up is necessary. No renal or obstructing ureteral calculi. The bladder contains a Foley catheter Stomach/Bowel: The stomach, duodenum small bowel and colon are grossly. No acute inflammatory process, mass lesions or obstructive findings. The terminal ileum and appendix are. Vascular/Lymphatic: Advanced atherosclerotic calcification involving the aorta and iliac arteries but no aneurysm. No mesenteric or retroperitoneal mass or adenopathy. Reproductive:  Surgically absent. Other: No pelvic mass or adenopathy. No free pelvic fluid collections. No inguinal mass or adenopathy. No abdominal wall hernia or subcutaneous lesions. Musculoskeletal: No significant bony findings. Moderate degenerative changes involving the lumbar spine. IMPRESSION: 1. No acute abdominal/pelvic findings, mass lesions or adenopathy. 2. Mild emphysematous changes with areas of paraseptal bulla. No pulmonary infiltrates or pleural effusions. 3. Status post cholecystectomy. No biliary dilatation. 4. Advanced atherosclerotic calcification involving the thoracic and abdominal aorta and iliac arteries. Electronically Signed   By: Marijo Sanes M.D.   On: 08/14/2022 12:20   DG Chest Port 1 View  Result Date: 08/14/2022 CLINICAL DATA:  Syncope EXAM: PORTABLE CHEST 1  VIEW COMPARISON:  01/23/2009 FINDINGS: The heart size and mediastinal contours are within normal limits. Both lungs are clear. The visualized skeletal structures are unremarkable. IMPRESSION: No active disease. Electronically Signed   By: Kathreen Devoid M.D.   On: 08/14/2022 10:00     ASSESSMENT AND PLAN    -Multidisciplinary rounds held today  Acute Hypoxic Respiratory Failure -due to COVID19 infection -continue supplemental O2 -s/p paxlovid x 1 dose , please dc this as patient now has AKI stage 2 -continue Bronchodilator Therapy Zinc/vitaminC -hold steroids while in HHS  Hyperosmolar hyperglycemia -blood glucose >800 on arrival with normal pH -IVF with insulin ongoing -follow up cardiac biomarkers as indicated ICU monitoring   Hyperkalemia   - no need to reduce K since it should autocorrect with insulin infusion   Hyperthyroidism   - continue home synthroid  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 with acute on chronic renal failure KDIGO stage 3 -follow chem 7 -follow UO -continue Foley Catheter-assess need daily   ID -continue IV abx as prescibed -follow up cultures  GI/Nutrition GI  PROPHYLAXIS as indicated DIET-->TF's as tolerated Constipation protocol as indicated  ENDO - ICU hypoglycemic\Hyperglycemia protocol -check FSBS per protocol   ELECTROLYTES -follow labs as needed -replace as needed -pharmacy consultation   DVT/GI PRX ordered -SCDs  TRANSFUSIONS AS NEEDED MONITOR FSBS ASSESS the need for LABS as needed   Critical care provider statement:   Total critical care time: 33 minutes   Performed by: Lanney Gins MD   Critical care time was exclusive of separately billable procedures and treating other patients.   Critical care was necessary to treat or prevent imminent or life-threatening deterioration.   Critical care was time spent personally by me on the following activities: development of treatment plan with patient and/or surrogate as well as nursing, discussions with consultants, evaluation of patient's response to treatment, examination of patient, obtaining history from patient or surrogate, ordering and performing treatments and interventions, ordering and review of laboratory studies, ordering and review of radiographic studies, pulse oximetry and re-evaluation of patient's condition.    Ottie Glazier, M.D.  Pulmonary & Critical Care Medicine

## 2022-08-14 NOTE — Progress Notes (Signed)
CRITICAL VALUE STICKER  CRITICAL VALUE: Serum osmolarity 329  RECEIVER (on-site recipient of call): Freda Jackson RN  DATE & TIME NOTIFIED: 08/14/22 @ 1455  MESSENGER (representative from lab): MU  MD NOTIFIED: Dr. Lanney Gins and Dewaine Conger, [place]: [phone]

## 2022-08-14 NOTE — ED Triage Notes (Signed)
Pt COVID + yesterday. This morning around 0200 pt tried to eat a snack at her counter and woke up on the floor. Pt DM1 with an insulin pump. Pt states her pump hasn't been working and randomly turns off. Glucose high for EMS. Pt GCS 15 at this time.

## 2022-08-14 NOTE — ED Notes (Signed)
Warm blankets applied

## 2022-08-14 NOTE — Progress Notes (Signed)
CRITICAL VALUE STICKER  CRITICAL VALUE: Troponin 115  RECEIVER (on-site recipient of call): Freda Jackson RN  DATE & TIME NOTIFIED: 08/14/22 @ 1510  MESSENGER (representative from lab): MU  MD NOTIFIED: Dr. Reita Cliche, NP  TIME OF NOTIFICATION: [phone]

## 2022-08-14 NOTE — ED Notes (Signed)
Pt placed in trendelenburgs

## 2022-08-14 NOTE — ED Notes (Signed)
Pts O2 89% on room air. Pt placed on 2L O2 via [place].

## 2022-08-14 NOTE — ED Provider Notes (Signed)
[facility] Provider Note   Event Date/Time   First MD Initiated Contact with Patient 08/14/22 0827     (approximate) History  Loss of Consciousness  HPI Elizabeth [name] is a 81 y.o. female with a past medical history of hyperlipidemia and type 1 diabetes on an insulin pump who presents via EMS with concerns of syncope.  Patient states that she was diagnosed with COVID yesterday/started on Paxlovid and when she was attempting to have a snack in the middle the night at approximately 2 PM she was sitting at her counter and suddenly woke up on the floor.  Patient also states that her insulin pump has been randomly turning off over the last 24 hours and now is just reading "high" on the monitor.  EMS note their glucometer read high on glucose check as well.  Patient also endorses 1 episode of nausea/vomiting.  EMS has given 4 mg IV Zofran ROS: Patient currently denies any vision changes, tinnitus, difficulty speaking, facial droop, sore throat, chest pain, abdominal pain, diarrhea, dysuria, or numbness/paresthesias in any extremity   Physical Exam  Triage Vital Signs: ED Triage Vitals  Enc Vitals Group     BP      Pulse      Resp      Temp      Temp src      SpO2      Weight      Height      Head Circumference      Peak Flow      Pain Score      Pain Loc      Pain Edu?      Excl. in [place]?    Most recent vital signs: Vitals:   08/14/22 1407 08/14/22 1500  BP: 127/61 (!) 92/37  Pulse:  82  Resp: 11 15  Temp: 97.7 F (36.5 C) 98.8 F (37.1 C)  SpO2: 94% 93%   General: Awake, oriented x4. CV:  Good peripheral perfusion.  Resp:  Normal effort.  Abd:  No distention.  Other:  Elderly Caucasian female laying in bed in no acute distress. ED Results / Procedures / Treatments  Labs (all labs ordered are listed, but only abnormal results are displayed) Labs Reviewed  COMPREHENSIVE METABOLIC PANEL - Abnormal; Notable for the following components:       Result Value   Sodium 127 (*)    Potassium 6.2 (*)    Chloride 87 (*)    CO2 21 (*)    Glucose, Bld 894 (*)    BUN 41 (*)    Creatinine, Ser 2.44 (*)    Calcium 8.6 (*)    AST 42 (*)    Total Bilirubin 2.8 (*)    GFR, Estimated 19 (*)    Anion gap 19 (*)    All other components within normal limits  LACTIC ACID, PLASMA - Abnormal; Notable for the following components:   Lactic Acid, Venous 5.5 (*)    All other components within normal limits  LACTIC ACID, PLASMA - Abnormal; Notable for the following components:   Lactic Acid, Venous 5.9 (*)    All other components within normal limits  CBC WITH DIFFERENTIAL/PLATELET - Abnormal; Notable for the following components:   WBC 11.3 (*)    Neutro Abs 10.4 (*)    Lymphs Abs 0.4 (*)    All other components within normal limits  URINALYSIS, ROUTINE W REFLEX MICROSCOPIC - Abnormal; Notable for the following components:  Color, Urine STRAW (*)    APPearance CLEAR (*)    Glucose, UA >=500 (*)    Ketones, ur 5 (*)    All other components within normal limits  BASIC METABOLIC PANEL - Abnormal; Notable for the following components:   Sodium 127 (*)    Potassium 5.9 (*)    Chloride 86 (*)    CO2 19 (*)    Glucose, Bld 904 (*)    BUN 44 (*)    Creatinine, Ser 2.57 (*)    Calcium 8.6 (*)    GFR, Estimated 18 (*)    Anion gap 22 (*)    All other components within normal limits  OSMOLALITY - Abnormal; Notable for the following components:   Osmolality 329 (*)    All other components within normal limits  BRAIN NATRIURETIC PEPTIDE - Abnormal; Notable for the following components:   B Natriuretic Peptide 285.5 (*)    All other components within normal limits  PHOSPHORUS - Abnormal; Notable for the following components:   Phosphorus 4.8 (*)    All other components within normal limits  GLUCOSE, CAPILLARY - Abnormal; Notable for the following components:   Glucose-Capillary >600 (*)    All other components within normal limits   GLUCOSE, CAPILLARY - Abnormal; Notable for the following components:   Glucose-Capillary >600 (*)    All other components within normal limits  GLUCOSE, CAPILLARY - Abnormal; Notable for the following components:   Glucose-Capillary 594 (*)    All other components within normal limits  CBG MONITORING, ED - Abnormal; Notable for the following components:   Glucose-Capillary >600 (*)    All other components within normal limits  TROPONIN I (HIGH SENSITIVITY) - Abnormal; Notable for the following components:   Troponin I (High Sensitivity) 20 (*)    All other components within normal limits  TROPONIN I (HIGH SENSITIVITY) - Abnormal; Notable for the following components:   Troponin I (High Sensitivity) 115 (*)    All other components within normal limits  CULTURE, BLOOD (ROUTINE X 2)  CULTURE, BLOOD (ROUTINE X 2)  CULTURE, RESPIRATORY W GRAM STAIN  MRSA NEXT GEN BY PCR, NASAL  BLOOD GAS, VENOUS  MAGNESIUM  LEGIONELLA PNEUMOPHILA SEROGP 1 UR AG  STREP PNEUMONIAE URINARY ANTIGEN  BASIC METABOLIC PANEL  BASIC METABOLIC PANEL  BASIC METABOLIC PANEL  BASIC METABOLIC PANEL   EKG ED ECG REPORT I, Naaman Plummer, the attending physician, personally viewed and interpreted this ECG. Date: 08/14/2022 EKG Time: 0848 Rate: 72 Rhythm: normal sinus rhythm QRS Axis: normal Intervals: normal ST/T Wave abnormalities: normal Narrative Interpretation: no evidence of acute ischemia RADIOLOGY ED MD interpretation: CT of the chest abdomen pelvis interpreted by me and shows no evidence of acute abdominal pelvic findings and only mild emphysematous changes in the lungs  One-view portable chest x-ray interpreted by me shows no evidence of acute abnormalities including no pneumonia, pneumothorax, or widened mediastinum -Agree with radiology assessment Official radiology report(s): CT CHEST ABDOMEN PELVIS WO CONTRAST  Result Date: 08/14/2022 CLINICAL DATA:  Sepsis.  COVID positive. EXAM: CT CHEST,  ABDOMEN AND PELVIS WITHOUT CONTRAST TECHNIQUE: Multidetector CT imaging of the chest, abdomen and pelvis was performed following the standard protocol without IV contrast. RADIATION DOSE REDUCTION: This exam was performed according to the departmental dose-optimization program which includes automated exposure control, adjustment of the mA and/or kV according to patient size and/or use of iterative reconstruction technique. COMPARISON:  CT scan from 2010 FINDINGS: CT CHEST FINDINGS Cardiovascular: The heart  is normal in size. No pericardial effusion. There is mild tortuosity and moderate atherosclerotic calcification of the thoracic aorta. No focal aneurysm. Three-vessel coronary artery calcifications are noted. Mediastinum/Nodes: No mediastinal or hilar mass or lymphadenopathy. Small scattered lymph nodes are noted. The esophagus is grossly normal. Lungs/Pleura: Azygous fissure is noted. Mild emphysematous changes with areas of paraseptal bulla. No pulmonary infiltrates or pleural effusions. No pulmonary edema. Patchy areas bronchiectasis. Musculoskeletal: No breast masses, supraclavicular or axillary adenopathy. The bony thorax is intact. CT ABDOMEN PELVIS FINDINGS Hepatobiliary: No hepatic lesions or intrahepatic biliary dilatation. The gallbladder is surgically absent. No common bile duct dilatation. Pancreas: No mass, inflammation or ductal dilatation. Spleen: Normal size.  No focal lesions. Adrenals/Urinary Tract: The adrenal glands are unremarkable. There is a rim calcified cyst projecting off the lower pole region of the right kidney. This measures a maximum of 3.8 cm and measures 19 Hounsfield units. This was present on the prior study from 2010 but measured 9 cm on that study. No further imaging evaluation follow-up is necessary. No renal or obstructing ureteral calculi. The bladder contains a Foley catheter Stomach/Bowel: The stomach, duodenum small bowel and colon are grossly. No acute inflammatory  process, mass lesions or obstructive findings. The terminal ileum and appendix are. Vascular/Lymphatic: Advanced atherosclerotic calcification involving the aorta and iliac arteries but no aneurysm. No mesenteric or retroperitoneal mass or adenopathy. Reproductive: Surgically absent. Other: No pelvic mass or adenopathy. No free pelvic fluid collections. No inguinal mass or adenopathy. No abdominal wall hernia or subcutaneous lesions. Musculoskeletal: No significant bony findings. Moderate degenerative changes involving the lumbar spine. IMPRESSION: 1. No acute abdominal/pelvic findings, mass lesions or adenopathy. 2. Mild emphysematous changes with areas of paraseptal bulla. No pulmonary infiltrates or pleural effusions. 3. Status post cholecystectomy. No biliary dilatation. 4. Advanced atherosclerotic calcification involving the thoracic and abdominal aorta and iliac arteries. Electronically Signed   By: Marijo Sanes M.D.   On: 08/14/2022 12:20   DG Chest Port 1 View  Result Date: 08/14/2022 CLINICAL DATA:  Syncope EXAM: PORTABLE CHEST 1 VIEW COMPARISON:  01/23/2009 FINDINGS: The heart size and mediastinal contours are within normal limits. Both lungs are clear. The visualized skeletal structures are unremarkable. IMPRESSION: No active disease. Electronically Signed   By: Kathreen Devoid M.D.   On: 08/14/2022 10:00   PROCEDURES: Critical Care performed: Yes, see critical care procedure note(s) .1-3 Lead EKG Interpretation  Performed by: Naaman Plummer, MD Authorized by: Naaman Plummer, MD     Interpretation: normal     ECG rate:  71   ECG rate assessment: normal     Rhythm: sinus rhythm     Ectopy: none     Conduction: normal   CRITICAL CARE Performed by: Naaman Plummer  Total critical care time: 55 minutes  Critical care time was exclusive of separately billable procedures and treating other patients.  Critical care was necessary to treat or prevent imminent or life-threatening  deterioration.  Critical care was time spent personally by me on the following activities: development of treatment plan with patient and/or surrogate as well as nursing, discussions with consultants, evaluation of patient's response to treatment, examination of patient, obtaining history from patient or surrogate, ordering and performing treatments and interventions, ordering and review of laboratory studies, ordering and review of radiographic studies, pulse oximetry and re-evaluation of patient's condition.  MEDICATIONS ORDERED IN ED: Medications  lactated ringers infusion ( Intravenous Infusion Verify 08/14/22 1528)  dextrose 5 % in lactated ringers infusion (  0 mLs Intravenous Hold 08/14/22 1414)  dextrose 50 % solution 0-50 mL (has no administration in time range)  norepinephrine (LEVOPHED) '4mg'$  in 221m (0.016 mg/mL) premix infusion (3 mcg/min Intravenous Infusion Verify 08/14/22 1528)  insulin regular, human (MYXREDLIN) 100 units/ 100 mL infusion (7.5 Units/hr Intravenous Infusion Verify 08/14/22 1528)  docusate sodium (COLACE) capsule 100 mg (has no administration in time range)  polyethylene glycol (MIRALAX / GLYCOLAX) packet 17 g (has no administration in time range)  heparin injection 5,000 Units (5,000 Units Subcutaneous Given 08/14/22 1413)  Chlorhexidine Gluconate Cloth 2 % PADS 6 each (6 each Topical Given 08/14/22 1415)  Oral care mouth rinse (has no administration in time range)  sodium chloride 0.9 % bolus 1,000 mL (0 mLs Intravenous Stopped 08/14/22 1030)  ondansetron (ZOFRAN) injection 4 mg (4 mg Intravenous Given 08/14/22 0950)  sodium chloride 0.9 % bolus 1,000 mL (0 mLs Intravenous Stopped 08/14/22 1130)  lactated ringers bolus 1,434 mL (0 mLs Intravenous Stopped 08/14/22 1217)   IMPRESSION / MDM / ASSESSMENT AND PLAN / ED COURSE  I reviewed the triage vital signs and the nursing notes.                             The patient is on the cardiac monitor to evaluate for evidence of  arrhythmia and/or significant heart rate changes. Patient's presentation is most consistent with acute presentation with potential threat to life or bodily function. Patients presentation most consistent with hyperglycemic state WITH evidence of HHS with altered mental status. Given Exam, History, and Workup I have low suspicion for an emergent precipitating factor of this hyperglycemic state such as atypical MI, acute abdomen, or other serious bacterial illness. Patient is type I diabetic with nonadherence in medication regimen/adherence. pH: 7.3 Potassium: 6.1  Patient started on continuous fluid boluses of LR, insulin drip, potassium chloride supplemented as needed given potassium level at each BMP Reassessment: Patient's mental/respiratory status improving Dispo: Admit    FINAL CLINICAL IMPRESSION(S) / ED DIAGNOSES   Final diagnoses:  Syncope and collapse  Hyperosmolar hyperglycemic state (HHS) ([place]   Rx / DC Orders   ED Discharge Orders     None      Note:  This document was prepared using Dragon voice recognition software and may include unintentional dictation errors.   BNaaman Plummer MD 08/14/22 [phone]

## 2022-08-14 NOTE — ED Notes (Signed)
Primary RN aware of critical labs as well as MD

## 2022-08-14 NOTE — Inpatient Diabetes Management (Signed)
Inpatient Diabetes Program Recommendations  AACE/ADA: New Consensus Statement on Inpatient Glycemic Control (2015)  Target Ranges:  Prepandial:   less than 140 mg/dL      Peak postprandial:   less than 180 mg/dL (1-2 hours)      Critically ill patients:  140 - 180 mg/dL   Lab Results  Component Value Date   GLUCAP 138 (H) 02/05/2018   HGBA1C 6.5 (H) 01/20/2018    Latest Reference Range & Units 08/14/22 08:44  Potassium 3.5 - 5.1 mmol/L 6.2 (H)  Chloride 98 - 111 mmol/L 87 (L)  CO2 22 - 32 mmol/L 21 (L)  Glucose 70 - 99 mg/dL 894 (HH)  BUN 8 - 23 mg/dL 41 (H)  Creatinine 0.44 - 1.00 mg/dL 2.44 (H)  Calcium 8.9 - 10.3 mg/dL 8.6 (L)  Anion gap 5 - 15  19 (H)  Alkaline Phosphatase 38 - 126 U/L 94  Albumin 3.5 - 5.0 g/dL 3.7  AST 15 - 41 U/L 42 (H)  ALT 0 - 44 U/L 24  Total Protein 6.5 - 8.1 g/dL 7.1  Total Bilirubin 0.3 - 1.2 mg/dL 2.8 (H)  GFR, Estimated >60 mL/min 19 (L)   Diabetes history: DM1 (requires basal, meal coverage and correction) Outpatient Diabetes medications: T slim insulin pump Current orders for Inpatient glycemic control: IV insulin  Inpatient Diabetes Program Recommendations:   Noted patient had syncope @ home after diagnosed with Covid and started on Paxlovid. Noted endotool orders placed as HHS ENDOX2. Please change to ENDOX1 for type 1.  Per Endocrinologist's office notes on 03/17/22 "She is on Novolog in the T slim pump:  Basal rates Midnight = 0.45 6 AM = 0.625 9 AM = 0.625 2 PM = 0.625 6 PM = 0.45 TDD basal: 12.9 units  Bolus settings I/C: 9 at midnight, 12 at 2 PM ISF: 52 at midnight, 55 at 9 AM Target Glucose: 110 at midnight, 130 at 6 PM Active insulin time: 4 hours  Total Daily dose approximately 25/35 units"  Will follow during hospitalization and assist as needed with diabetes management.  Thank you, Nani Gasser. Liana Camerer, RN, MSN, CDE  Diabetes Coordinator Inpatient Glycemic Control Team Team Pager [phone] (8am-5pm) 08/14/2022  11:26 AM

## 2022-08-15 LAB — CBC
HCT: 35.4 % — ABNORMAL LOW (ref 36.0–46.0)
Hemoglobin: 11.9 g/dL — ABNORMAL LOW (ref 12.0–15.0)
MCH: 29.2 pg (ref 26.0–34.0)
MCHC: 33.6 g/dL (ref 30.0–36.0)
MCV: 87 fL (ref 80.0–100.0)
Platelets: 227 10*3/uL (ref 150–400)
RBC: 4.07 MIL/uL (ref 3.87–5.11)
RDW: 14 % (ref 11.5–15.5)
WBC: 16.3 10*3/uL — ABNORMAL HIGH (ref 4.0–10.5)
nRBC: 0 % (ref 0.0–0.2)

## 2022-08-15 LAB — GLUCOSE, CAPILLARY
Glucose-Capillary: 126 mg/dL — ABNORMAL HIGH (ref 70–99)
Glucose-Capillary: 142 mg/dL — ABNORMAL HIGH (ref 70–99)
Glucose-Capillary: 151 mg/dL — ABNORMAL HIGH (ref 70–99)
Glucose-Capillary: 154 mg/dL — ABNORMAL HIGH (ref 70–99)
Glucose-Capillary: 166 mg/dL — ABNORMAL HIGH (ref 70–99)
Glucose-Capillary: 172 mg/dL — ABNORMAL HIGH (ref 70–99)
Glucose-Capillary: 184 mg/dL — ABNORMAL HIGH (ref 70–99)
Glucose-Capillary: 210 mg/dL — ABNORMAL HIGH (ref 70–99)
Glucose-Capillary: 294 mg/dL — ABNORMAL HIGH (ref 70–99)

## 2022-08-15 LAB — PHOSPHORUS: Phosphorus: 3.8 mg/dL (ref 2.5–4.6)

## 2022-08-15 LAB — BASIC METABOLIC PANEL
Anion gap: 10 (ref 5–15)
Anion gap: 8 (ref 5–15)
BUN: 45 mg/dL — ABNORMAL HIGH (ref 8–23)
BUN: 45 mg/dL — ABNORMAL HIGH (ref 8–23)
CO2: 26 mmol/L (ref 22–32)
CO2: 24 mmol/L (ref 22–32)
Calcium: 8.3 mg/dL — ABNORMAL LOW (ref 8.9–10.3)
Calcium: 8.2 mg/dL — ABNORMAL LOW (ref 8.9–10.3)
Chloride: 102 mmol/L (ref 98–111)
Chloride: 105 mmol/L (ref 98–111)
Creatinine, Ser: 1.99 mg/dL — ABNORMAL HIGH (ref 0.44–1.00)
Creatinine, Ser: 1.73 mg/dL — ABNORMAL HIGH (ref 0.44–1.00)
GFR, Estimated: 25 mL/min — ABNORMAL LOW (ref >60)
GFR, Estimated: 29 mL/min — ABNORMAL LOW (ref >60)
Glucose, Bld: 200 mg/dL — ABNORMAL HIGH (ref 70–99)
Glucose, Bld: 129 mg/dL — ABNORMAL HIGH (ref 70–99)
Potassium: 4.3 mmol/L (ref 3.5–5.1)
Potassium: 4 mmol/L (ref 3.5–5.1)
Sodium: 138 mmol/L (ref 135–145)
Sodium: 137 mmol/L (ref 135–145)

## 2022-08-15 LAB — HEMOGLOBIN A1C
Hgb A1c MFr Bld: 6.9 % — ABNORMAL HIGH (ref 4.8–5.6)
Mean Plasma Glucose: 151.33 mg/dL

## 2022-08-15 LAB — MAGNESIUM: Magnesium: 1.9 mg/dL (ref 1.7–2.4)

## 2022-08-15 LAB — LEGIONELLA PNEUMOPHILA SEROGP 1 UR AG: L. pneumophila Serogp 1 Ur Ag: NEGATIVE

## 2022-08-15 LAB — FERRITIN: Ferritin: 161 ng/mL (ref 11–307)

## 2022-08-15 LAB — C-REACTIVE PROTEIN: CRP: 1.2 mg/dL — ABNORMAL HIGH (ref <1.0)

## 2022-08-15 LAB — D-DIMER, QUANTITATIVE: D-Dimer, Quant: 1.45 ug/mL-FEU — ABNORMAL HIGH (ref 0.00–0.50)

## 2022-08-15 MED ORDER — MENTHOL 3 MG MT LOZG
1.0000 | LOZENGE | OROMUCOSAL | Status: DC | PRN
  Filled 2022-08-15: qty 9

## 2022-08-15 MED ORDER — INSULIN GLARGINE-YFGN 100 UNIT/ML ~~LOC~~ SOLN
10.0000 [IU] | Freq: Every day | SUBCUTANEOUS | Status: DC
  Administered 2022-08-15 – 2022-08-17 (×3): 10 [IU] via SUBCUTANEOUS
  Filled 2022-08-15 (×3): qty 0.1

## 2022-08-15 MED ORDER — INSULIN ASPART 100 UNIT/ML IJ SOLN: 0.0000 [IU] | Freq: Three times a day (TID) | INTRAMUSCULAR | Status: DC

## 2022-08-15 MED ORDER — LACTATED RINGERS IV SOLN
INTRAVENOUS | Status: DC
  Administered 2022-08-15: 125 mL/h via INTRAVENOUS

## 2022-08-15 MED ORDER — INSULIN ASPART 100 UNIT/ML IJ SOLN
0.0000 [IU] | INTRAMUSCULAR | Status: DC
  Administered 2022-08-15 (×2): 1 [IU] via SUBCUTANEOUS
  Administered 2022-08-15: 2 [IU] via SUBCUTANEOUS
  Filled 2022-08-15 (×3): qty 1

## 2022-08-15 MED ORDER — INSULIN ASPART 100 UNIT/ML IJ SOLN
0.0000 [IU] | Freq: Three times a day (TID) | INTRAMUSCULAR | Status: DC
  Administered 2022-08-15: 3 [IU] via SUBCUTANEOUS
  Administered 2022-08-15: 2 [IU] via SUBCUTANEOUS
  Filled 2022-08-15 (×2): qty 1

## 2022-08-15 MED ORDER — ADULT MULTIVITAMIN W/MINERALS CH
1.0000 | ORAL_TABLET | Freq: Every day | ORAL | Status: DC
  Administered 2022-08-16 – 2022-08-17 (×2): 1 via ORAL
  Filled 2022-08-15 (×2): qty 1

## 2022-08-15 MED ORDER — INSULIN ASPART 100 UNIT/ML IJ SOLN: 0.0000 [IU] | Freq: Three times a day (TID) | INTRAMUSCULAR | Status: DC | PRN

## 2022-08-15 MED ORDER — GLUCERNA SHAKE PO LIQD
237.0000 mL | Freq: Three times a day (TID) | ORAL | Status: DC
  Administered 2022-08-15 – 2022-08-17 (×4): 237 mL via ORAL

## 2022-08-15 MED ORDER — LEVOTHYROXINE SODIUM 88 MCG PO TABS
88.0000 ug | ORAL_TABLET | Freq: Every day | ORAL | Status: DC
  Administered 2022-08-16 – 2022-08-17 (×2): 88 ug via ORAL
  Filled 2022-08-15 (×2): qty 1

## 2022-08-15 NOTE — Progress Notes (Signed)
NAME:  Elizabeth [name], MRN:  [id number], DOB:  1940/11/29, LOS: 1 ADMISSION DATE:  08/14/2022,   Brief Pt Description / Synopsis:    History of Present Illness:   The patient is alert and awake and communicative she had a headache earlier this morning this has resolved.  She has been off insulin as well as vasopressors.  She has been following commands and moving all extremities.  Urine output is good.  Patient is eating without any nausea vomiting or diarrhea.  Denies chest pain.  Denies shortness of breath currently.  ED Course: Initial Vital Signs: Significant Labs: Imaging Chest X-ray>> Medications Administered:   Pertinent  Medical History    Micro Data:    Antimicrobials:    Significant Hospital Events: Including procedures, antibiotic start and stop dates in addition to other pertinent events     Interim History / Subjective:  Comfortable no distress  Objective   Blood pressure 131/64, pulse 65, temperature 99.1 F (37.3 C), resp. rate 15, height '5\' 4"'$  (1.626 m), weight 73.1 kg, SpO2 91 %.        Intake/Output Summary (Last 24 hours) at 08/15/2022 1703 Last data filed at 08/15/2022 1346 Gross per 24 hour  Intake 2109.27 ml  Output 350 ml  Net 1759.27 ml   Filed Weights   08/14/22 1000 08/14/22 1407 08/15/22 0426  Weight: 71.7 kg 73.1 kg 73.1 kg    Examination: General.  Comfortable in no distress.  HEENT mild pallor no icterus or cyanosis.  No JVD.  Heart S1-S2 no parasternal heave.  Lungs bilateral entry was heard no wheezing or crackles..  Abdomen soft nontender.  Extremity pulses palpable Resolved Hospital Problem list     Assessment & Plan:  1.  Acute hypoxic respiratory failure with COVID-19 2.  Hyperosmolar hyperglycemia 3.  Hypothyroidism Plan Patient can be transitioned to floor status. Will be maintained on  precautions for COVID-19 Physical therapy and Occupational Therapy. Insulin sliding scale.  Acting has also been  started Continue with Glucerna. Continue Synthroid.  Best Practice (right click and "Reselect all SmartList Selections" daily)     Labs   CBC: Recent Labs  Lab 08/14/22 0844 08/15/22 0432  WBC 11.3* 16.3*  NEUTROABS 10.4*  --   HGB 14.3 11.9*  HCT 44.3 35.4*  MCV 90.8 87.0  PLT 252 008    Basic Metabolic Panel: Recent Labs  Lab 08/14/22 1121 08/14/22 1432 08/14/22 1545 08/14/22 2011 08/15/22 0104 08/15/22 0432  NA 127*  --  132* 136 138 137  K 5.9*  --  4.2 4.0 4.3 4.0  CL 86*  --  98 99 102 105  CO2 19*  --  20* '22 26 24  '$ GLUCOSE 904*  --  545* 318* 200* 129*  BUN 44*  --  45* 45* 45* 45*  CREATININE 2.57*  --  2.16* 2.13* 1.99* 1.73*  CALCIUM 8.6*  --  8.2* 8.5* 8.3* 8.2*  MG  --  1.9  --   --   --  1.9  PHOS  --  4.8*  --   --   --  3.8   GFR: Estimated Creatinine Clearance: 24.6 mL/min (A) (by C-G formula based on SCr of 1.73 mg/dL (H)). Recent Labs  Lab 08/14/22 0844 08/14/22 1121 08/15/22 0432  WBC 11.3*  --  16.3*  LATICACIDVEN 5.5* 5.9*  --     Liver Function Tests: Recent Labs  Lab 08/14/22 0844  AST 42*  ALT 24  ALKPHOS 94  BILITOT 2.8*  PROT 7.1  ALBUMIN 3.7   No results for input(s): "LIPASE", "AMYLASE" in the last 168 hours. No results for input(s): "AMMONIA" in the last 168 hours.  ABG    Component Value Date/Time   HCO3 24.7 08/14/2022 0850     Coagulation Profile: No results for input(s): "INR", "PROTIME" in the last 168 hours.  Cardiac Enzymes: No results for input(s): "CKTOTAL", "CKMB", "CKMBINDEX", "TROPONINI" in the last 168 hours.  HbA1C: Hgb A1c MFr Bld  Date/Time Value Ref Range Status  08/15/2022 04:16 AM 6.9 (H) 4.8 - 5.6 % Final    Comment:    (NOTE) Pre diabetes:          5.7%-6.4%  Diabetes:              >6.4%  Glycemic control for   <7.0% adults with diabetes   01/20/2018 03:36 PM 6.5 (H) 4.8 - 5.6 % Final    Comment:    (NOTE) Pre diabetes:          5.7%-6.4% Diabetes:               >6.4% Glycemic control for   <7.0% adults with diabetes     CBG: Recent Labs  Lab 08/15/22 0137 08/15/22 0233 08/15/22 0337 08/15/22 0827 08/15/22 1120  GLUCAP 172* 154* 126* 142* 184*    Review of Systems:   Positives in BOLD: Gen: Denies fever, chills, weight change, fatigue, night sweats. Headache has resolved. HEENT: Denies blurred vision, double vision, hearing loss, tinnitus, sinus congestion, rhinorrhea, sore throat, neck stiffness, dysphagia PULM: Denies shortness of breath, cough, sputum production, hemoptysis, wheezing CV: Denies chest pain, edema, orthopnea, paroxysmal nocturnal dyspnea, palpitations GI: Denies abdominal pain, nausea, vomiting, diarrhea, hematochezia, melena, constipation, change in bowel habits GU: Denies dysuria, hematuria, polyuria, oliguria, urethral discharge   Past Medical History:  She,  has a past medical history of Anemia, Arthritis, Asthma, Chronic kidney disease, Diabetes mellitus without complication ([place]) (8341), GERD (gastroesophageal reflux disease), Heart murmur, cervical spine surgery, Hyperlipidemia, Hypothyroidism, Insulin pump in place, and PONV (postoperative nausea and vomiting).   Surgical History:   Past Surgical History:  Procedure Laterality Date   ABDOMINAL HYSTERECTOMY  1972   BLADDER SURGERY  2003   leaky bladder   BREAST BIOPSY Left 2015   neg   CATARACT EXTRACTION W/PHACO Right 02/21/2016   Procedure: CATARACT EXTRACTION PHACO AND INTRAOCULAR LENS PLACEMENT (I[facility]);  Surgeon: Leandrew Koyanagi, MD;  Location: [facility] ORS;  Service: Ophthalmology;  Laterality: Right;  Lot# [id number] HUS: 00:50.6AP%:17.0CDE: 8.56   CATARACT EXTRACTION W/PHACO Left 03/26/2016   Procedure: CATARACT EXTRACTION PHACO AND INTRAOCULAR LENS PLACEMENT ([place]) left eye;  Surgeon: Leandrew Koyanagi, MD;  Location: [place];  Service: Ophthalmology;  Laterality: Left;  DIABETIC - insulin pump SYMFONY TORIC LENS   CHOLECYSTECTOMY  1995    COLONOSCOPY WITH PROPOFOL N/A 01/28/2016   Procedure: COLONOSCOPY WITH PROPOFOL;  Surgeon: Hulen Luster, MD;  [facility] END[facility];  Service: Gastroenterology;  Laterality: N/A;   KNEE ARTHROPLASTY Left 02/03/2018   Procedure: COMPUTER ASSISTED TOTAL KNEE ARTHROPLASTY;  Surgeon: Dereck Leep, MD;  Location: [facility] ORS;  Service: Orthopedics;  Laterality: Left;   KNEE ARTHROSCOPY Left 09/17/2015   Procedure: ARTHROSCOPY KNEE,partial medial menisectomy, chondroplasty medial patella femoral.;  Surgeon: Dereck Leep, MD;  Location: [facility] ORS;  Service: Orthopedics;  Laterality: Left;   mallet toe Bilateral 2013, 2018   2nd toes   MENISECTOMY Right    2011   NECK SURGERY  2010   2 discs.  has plate and screws in place   SEPTOPLASTY  1997     Social History:   reports that she has never smoked. She has never used smokeless tobacco. She reports that she does not drink alcohol and does not use drugs.   Family History:  Her family history includes Breast cancer (age of onset: 77) in her maternal aunt and paternal aunt.   Allergies Allergies  Allergen Reactions   Neosporin [Neomycin-Bacitracin Zn-Polymyx] Other (See Comments)    Skin rash or redness   Ceclor [Cefaclor] Other (See Comments)    Unsure of reaction type   Other     Other reaction(s): Other (See Comments) sulfasaline-Does not remember   Polysorbate Other (See Comments)   Relafen [Nabumetone] Other (See Comments)    Unsure of reaction type   Sulfasalazine Other (See Comments)    Unsure of reaction type   Amoxicillin Nausea And Vomiting    Has patient had a PCN reaction causing immediate rash, facial/tongue/throat swelling, SOB or lightheadedness with hypotension: No Has patient had a PCN reaction causing severe rash involving mucus membranes or skin necrosis:No Has patient had a PCN reaction that required hospitalization: No Has patient had a PCN reaction occurring within the last 10 years: No If all of the above answers  are "NO", then may proceed with Cephalosporin use.    Band-Aid Plus Antibiotic [Bacitracin-Polymyxin B] Other (See Comments)    ADHESIVE, NOT ANTIBIOTIC Can use paper tape PULLS SKIN OFF Other reaction(s): Other (See Comments) PULLS SKIN OFF PULLS SKIN OFF   Clindamycin Rash   Niacin     Other reaction(s): Other (See Comments) flushing   Niaspan [Niacin Er] Other (See Comments)    FLUSHING   Penicillin V Potassium Other (See Comments)    Has patient had a PCN reaction causing immediate rash, facial/tongue/throat swelling, SOB or lightheadedness with hypotension: Unknown Has patient had a PCN reaction causing severe rash involving mucus membranes or skin necrosis: Unknown Has patient had a PCN reaction that required hospitalization: No Has patient had a PCN reaction occurring within the last 10 years: No If all of the above answers are "NO", then may proceed with Cephalosporin use.    Penicillins Nausea And Vomiting    Happened so long ago and patient is not sure of reaction   Statins Other (See Comments)    Other reaction(s): Other (See Comments) arthralgia ARTHRALGIA arthralgia     Home Medications  Prior to Admission medications   Medication Sig Start Date End Date Taking? Authorizing Provider  levothyroxine (SYNTHROID) 175 MCG tablet Take 87.5 mcg by mouth every morning. 05/28/22  Yes [provider]  PAXLOVID, 150/100, 10 x 150 MG & 10 x '100MG'$  TBPK Take by mouth. 08/13/22  Yes [provider]  rosuvastatin (CRESTOR) 10 MG tablet Take 10 mg by mouth at bedtime.  04/12/14  Yes [provider]  Biotin 10000 MCG TABS Take 10,000 mcg by mouth daily at 12 noon.    [provider]  Calcium Carb-Cholecalciferol (CALCIUM 600+D3 PO) Take 2 tablets by mouth daily.    [provider]  celecoxib (CELEBREX) 200 MG capsule Take 1 capsule (200 mg total) by mouth 2 (two) times daily. Patient not taking: Reported on 08/14/2022 02/05/18   Reche Dixon,  PA-C  diphenhydrAMINE [facility]) 25 MG tablet Take 25 mg by mouth at bedtime.  Patient not taking: Reported on 08/14/2022    [provider]  enoxaparin (LOVENOX) 40 MG/0.4ML  injection Inject 0.4 mLs (40 mg total) into the skin daily. Patient not taking: Reported on 08/14/2022 02/05/18   Reche Dixon, PA-C  glucagon 1 MG injection Inject 1 mg into the muscle daily as needed (for low blood sugars). Reported on 02/21/2016    [provider]  glucose 5 g chewable tablet Chew 15 g by mouth 3 (three) times daily as needed for low blood sugar.     [provider]  hydroxypropyl methylcellulose / hypromellose (ISOPTO TEARS / GONIOVISC) 2.5 % ophthalmic solution Place 1-2 drops into both eyes 3 (three) times daily as needed for dry eyes. Patient not taking: Reported on 08/14/2022    [provider]  Insulin Human (INSULIN PUMP) SOLN Inject into the skin. NOVOLOG 100 UNIT/ML injection    [provider]  levothyroxine (SYNTHROID, LEVOTHROID) 100 MCG tablet Take 100 mcg by mouth daily before breakfast.  Patient not taking: Reported on 08/14/2022 04/12/14   [provider]  Melatonin 10 MG TABS Take 10 mg by mouth at bedtime.    [provider]  NOVOLOG 100 UNIT/ML injection SMARTSIG:0-60 Unit(s) SUB-Q Daily 05/26/22   [provider]  oxyCODONE (OXY IR/ROXICODONE) 5 MG immediate release tablet Take 1 tablet (5 mg total) by mouth every 4 (four) hours as needed for moderate pain (pain score 4-6). Patient not taking: Reported on 08/14/2022 02/05/18   Reche Dixon, PA-C  promethazine (PHENERGAN) 25 MG tablet Take 1 tablet (25 mg total) by mouth every 8 (eight) hours as needed. Patient not taking: Reported on 01/20/2018 07/07/17   Tyson Dense T, DPM  pseudoephedrine-guaifenesin (MUCINEX D) 60-600 MG 12 hr tablet Take 1 tablet by mouth 2 (two) times daily as needed for congestion.    [provider]  traMADol (ULTRAM) 50 MG tablet Take 1-2 tablets  (50-100 mg total) by mouth every 4 (four) hours as needed for moderate pain. Patient not taking: Reported on 08/14/2022 02/05/18   Reche Dixon, PA-C  vitamin B-12 (CYANOCOBALAMIN) 1000 MCG tablet Take 1,000 mcg by mouth daily at 12 noon.    [provider]     Critical care time:      Darel Hong, AGACNP-BC [facility] Prefer epic messenger for cross cover needs If after hours, please call E-link

## 2022-08-15 NOTE — Inpatient Diabetes Management (Addendum)
Inpatient Diabetes Program Recommendations  AACE/ADA: New Consensus Statement on Inpatient Glycemic Control (2015)  Target Ranges:  Prepandial:   less than 140 mg/dL      Peak postprandial:   less than 180 mg/dL (1-2 hours)      Critically ill patients:  140 - 180 mg/dL    Latest Reference Range & Units 08/14/22 21:51 08/14/22 22:57 08/14/22 23:58 08/15/22 00:56 08/15/22 01:37 08/15/22 02:33 08/15/22 03:37 08/15/22 08:27  Glucose-Capillary 70 - 99 mg/dL 186 (H)  IV Insulin Drip Running 187 (H) 151 (H) 166 (H) 172 (H)  2 units Novolog  10 units Semglee 154 (H) 126 (H)  1 unit Novolog  IV Insulin Drip Stopped 142 (H)  1 unit Novolog   (H): Data is abnormally high    History: Type 1 Diabetes (requires basal, meal coverage and correction)  Home DM Meds: T Slim Insulin Pump  Current Orders: Semglee 10 units Daily     Novolog Sensitive Correction Scale/ SSI (0-9 units) TID    Insulin Pump settings are as follows: Basal rates Midnight = 0.45 6 AM = 0.625 9 AM = 0.625 2 PM = 0.625 6 PM = 0.45 TDD basal: 12.9 units  Bolus settings I/C: 9 at midnight, 12 at 2 PM ISF: 52 at midnight, 55 at 9 AM Target Glucose: 110 at midnight, 130 at 6 PM Active insulin time: 4 hours   Met w/ pt at bedside today.  Pt A&O and able to answer all of my questions.  Told me she was diagnosed with Type 1 diabetes at age 81.  Sees Dr. Honor Junes with [facility]--last seen May 2023.  Had appt with ENDO today but has rescheduled due to hospitalization.  Pt does not know where her insulin pump is.  I checked her belongings bag that contained a nightgown and robe but there was no Insulin pump in the bag.  Per RN, daughter of pt took pt's insulin pump home--I let pt know this info.  Discussed with pt the procedure for resuming her insulin pump when she goes home.  Asked pt to ask RN on day of d/c when she was last given Basal Insulin (ordered as Semglee here in hospital Daily)--Instructed pt that she  can resume her pump when she goes home but she will need to suspend the basal rates on the pump until 24 hours after last dose Semglee insulin given to avoid overlap of the basal insulin we give her and the basal insulin rates on her pump--Pt knows overlap of these 2 basal insulins could cause Hypoglycemia--pt can use bolus feature of pump immediately upon arrival home.  Asked pt if she knew how to temporarily suspend the basal rates on her pump and she said she might know how, but if she struggles she will call the Insulin Pump company to find out.       --Will follow patient during hospitalization--  Wyn Quaker RN, MSN, [place] Diabetes Coordinator Inpatient Glycemic Control Team Team Pager: [phone] (8a-5p)

## 2022-08-15 NOTE — Evaluation (Signed)
Physical Therapy Evaluation Patient Details Name: Elizabeth [name] MRN: [id number] DOB: 05/17/1941 Today's Date: 08/15/2022  History of Present Illness  This is a 81 year old pleasant female with a history of hypothyroidism, morbid obesity, osteoarthritis, diabetes with insulin dependence.  Patient reports feeling flulike illness and testing positive for COVID 1 week ago, endorses 1 fall leading to admission. MD assessment includes: acute resp failure, hyperosmolar hyperglycemia, hyperkalemia, COVID.  Clinical Impression  Pt presents to PT in recliner and agreeable to participate in therapy services. Pt was pleasant and motivated to participate during the session and put forth good effort throughout. Pt able to perform transfers and amb w no physical assistance required and no LOB. Gait pattern WNL, but pt reports at [place] compared to her baseline. Spo2 monitored throughout tx session, remained >92% throughout and increased to highest point of 95% during amb, when good pleth present. Would benefit from skilled HHPT to address above deficits in strength, functional mobility, balance, and activity tolerance to promote optimal return to PLOF.        Recommendations for follow up therapy are one component of a multi-disciplinary discharge planning process, led by the attending physician.  Recommendations may be updated based on patient status, additional functional criteria and insurance authorization.  Follow Up Recommendations Home health PT      Assistance Recommended at Discharge Intermittent Supervision/Assistance  Patient can return home with the following  A little help with walking and/or transfers;A little help with bathing/dressing/bathroom;Assistance with cooking/housework;Assist for transportation;Help with stairs or ramp for entrance    Equipment Recommendations None recommended by PT  Recommendations for Other Services       Functional Status Assessment Patient has had a  recent decline in their functional status and demonstrates the ability to make significant improvements in function in a reasonable and predictable amount of time.     Precautions / Restrictions Precautions Precautions: Fall Restrictions Weight Bearing Restrictions: No      Mobility  Bed Mobility               General bed mobility comments: received and left in recliner    Transfers Overall transfer level: Needs assistance Equipment used: Rolling walker (2 wheels) Transfers: Sit to/from Stand Sit to Stand: Min guard           General transfer comment: assist for lines mgmt    Ambulation/Gait Ambulation/Gait assistance: Min guard Gait Distance (Feet): 50 Feet Assistive device: Rolling walker (2 wheels) Gait Pattern/deviations: WFL(Within Functional Limits), Step-through pattern Gait velocity: decr     General Gait Details: gait pattern WNL, decr cadence  Stairs            Wheelchair Mobility    Modified Rankin (Stroke Patients Only)       Balance Overall balance assessment: Needs assistance Sitting-balance support: No upper extremity supported, Feet supported Sitting balance-Leahy Scale: Good     Standing balance support: No upper extremity supported, During functional activity, Bilateral upper extremity supported Standing balance-Leahy Scale: Fair                               Pertinent Vitals/Pain Pain Assessment Pain Location: throat hurts, does not give NPRS rating Pain Intervention(s): Limited activity within patient's tolerance, Monitored during session    [place] expects to be discharged to:: Private residence Living Arrangements: Spouse/significant other Available Help at Discharge: Family;Available 24 hours/day Type of Home: House Home Access: Stairs to enter  Entrance Stairs-Rails: Right;Left, unable to reach both Entrance Stairs-Number of Steps: 3   Home Layout: One level;Laundry or work area  in [place]: Conservation officer, nature (2 wheels);Cane - single point      Prior Function Prior Level of Function : Independent/Modified Independent             Mobility Comments: full comm amb w no AD ADLs Comments: I w ADLs/IADLs     Hand Dominance        Extremity/Trunk Assessment   Upper Extremity Assessment Upper Extremity Assessment: Generalized weakness    Lower Extremity Assessment Lower Extremity Assessment: Generalized weakness    Cervical / Trunk Assessment Cervical / Trunk Assessment: Normal  Communication   Communication: No difficulties  Cognition Arousal/Alertness: Awake/alert Behavior During Therapy: WFL for tasks assessed/performed Overall Cognitive Status: Within Functional Limits for tasks assessed                                          General Comments General comments (skin integrity, edema, etc.): Spo2 >90% throughout tx session on RA    Exercises     Assessment/Plan    PT Assessment Patient needs continued PT services  PT Problem List Decreased strength;Decreased activity tolerance;Decreased balance;Decreased mobility       PT Treatment Interventions DME instruction;Therapeutic exercise;Gait training;Balance training;Stair training;Neuromuscular re-education;Functional mobility training;Therapeutic activities;Patient/family education    PT Goals (Current goals can be found in the Care Plan section)  Acute Rehab PT Goals Patient Stated Goal: to get back to walking PT Goal Formulation: With patient Time For Goal Achievement: 08/28/22 Potential to Achieve Goals: Good    Frequency Min 2X/week     Co-evaluation               AM-PAC PT "6 Clicks" Mobility  Outcome Measure Help needed turning from your back to your side while in a flat bed without using bedrails?: None Help needed moving from lying on your back to sitting on the side of a flat bed without using bedrails?: None Help needed moving to and  from a bed to a chair (including a wheelchair)?: A Little Help needed standing up from a chair using your arms (e.g., wheelchair or bedside chair)?: A Little Help needed to walk in hospital room?: A Little Help needed climbing 3-5 steps with a railing? : A Lot 6 Click Score: 19    End of Session Equipment Utilized During Treatment: Gait belt Activity Tolerance: Patient tolerated treatment well Patient left: in chair;with call bell/phone within reach, w L UE elevated Nurse Communication: Mobility status PT Visit Diagnosis: Muscle weakness (generalized) (M62.81);Difficulty in walking, not elsewhere classified (R26.2)    Time: 3382-5053 PT Time Calculation (min) (ACUTE ONLY): 49 min   Charges:              Glenice Laine MPH, SPT 08/15/22, 3:53 PM

## 2022-08-15 NOTE — Evaluation (Signed)
Occupational Therapy Evaluation Patient Details Name: Elizabeth [name] MRN: [id number] DOB: 06/16/1941 Today's Date: 08/15/2022   History of Present Illness This is a 81 year old pleasant female with a history of hypothyroidism, morbid obesity, osteoarthritis, diabetes with insulin dependence.  Patient reports feeling flulike illness and testing positive for COVID 1 week ago, endorses 1 fall leading to admission.   Clinical Impression   Ms Inclan was seen for OT evaluation this date. Prior to hospital admission, pt was Independent for mobility and ADLs. Pt lives with spouse in home c 3 STE. Pt presents to acute OT demonstrating impaired ADL performance and functional mobility 2/2 decreased activity tolerance and functional strength/balance deficits. Pt currently requires CGA for BSC t/f, SBA pericare in sitting. CGA + HHA for ~10 ft, improving to no HHA for ~10 ft in room mobility.  MOD I don B socks at bed level. Pt would benefit from skilled OT to address noted impairments and functional limitations (see below for any additional details). Upon hospital discharge, recommend no OT follow up.   Recommendations for follow up therapy are one component of a multi-disciplinary discharge planning process, led by the attending physician.  Recommendations may be updated based on patient status, additional functional criteria and insurance authorization.   Follow Up Recommendations  No OT follow up    Assistance Recommended at Discharge Intermittent Supervision/Assistance  Patient can return home with the following A little help with walking and/or transfers;A little help with bathing/dressing/bathroom    Functional Status Assessment  Patient has had a recent decline in their functional status and demonstrates the ability to make significant improvements in function in a reasonable and predictable amount of time.  Equipment Recommendations  BSC/3in1    Recommendations for Other Services        Precautions / Restrictions Precautions Precautions: Fall Restrictions Weight Bearing Restrictions: No      Mobility Bed Mobility Overal bed mobility: Needs Assistance Bed Mobility: Supine to Sit     Supine to sit: Supervision          Transfers Overall transfer level: Needs assistance Equipment used: 1 person hand held assist Transfers: Sit to/from Stand, Bed to chair/wheelchair/BSC Sit to Stand: Min guard     Step pivot transfers: Min guard     General transfer comment: assist for lines mgmt      Balance Overall balance assessment: Needs assistance Sitting-balance support: No upper extremity supported, Feet supported Sitting balance-Leahy Scale: Fair     Standing balance support: No upper extremity supported, During functional activity Standing balance-Leahy Scale: Fair                             ADL either performed or assessed with clinical judgement   ADL Overall ADL's : Needs assistance/impaired                                       General ADL Comments: CGA for BSC t/f, requires HHA for simulated toilet t/f. SBA pericare in sitting. MOD I don B socks at bed level.      Pertinent Vitals/Pain Pain Assessment Pain Assessment: Faces Faces Pain Scale: Hurts a little bit Pain Location: R knee Pain Descriptors / Indicators: Aching, Discomfort Pain Intervention(s): Limited activity within patient's tolerance     Hand Dominance     Extremity/Trunk Assessment Upper Extremity Assessment Upper Extremity Assessment: Overall  WFL for tasks assessed   Lower Extremity Assessment Lower Extremity Assessment: Generalized weakness       Communication Communication Communication: No difficulties   Cognition Arousal/Alertness: Awake/alert Behavior During Therapy: WFL for tasks assessed/performed Overall Cognitive Status: Within Functional Limits for tasks assessed                                        General Comments  Spo2 mid 90s on RA t/o session            Home Living Family/patient expects to be discharged to:: Private residence Living Arrangements: Spouse/significant other Available Help at Discharge: Family Type of Home: House Home Access: Stairs to enter Technical brewer of Steps: 3   Home Layout: One level;Laundry or work area in [place]: Conservation officer, nature (2 wheels);Cane - single point          Prior Functioning/Environment Prior Level of Function : Independent/Modified Independent                        OT Problem List: Decreased strength;Decreased activity tolerance;Impaired balance (sitting and/or standing)      OT Treatment/Interventions: Self-care/ADL training;Therapeutic exercise;Energy conservation;DME and/or AE instruction;Therapeutic activities;Patient/family education;Balance training    OT Goals(Current goals can be found in the care plan section) Acute Rehab OT Goals Patient Stated Goal: to go home OT Goal Formulation: With patient Time For Goal Achievement: 08/29/22 Potential to Achieve Goals: Good ADL Goals Pt Will Perform Grooming: Independently;standing Pt Will Perform Lower Body Dressing: Independently;sit to/from stand Pt Will Transfer to Toilet: Independently;ambulating;regular height toilet  OT Frequency: Min 2X/week    Co-evaluation              AM-PAC OT "6 Clicks" Daily Activity     Outcome Measure Help from another person eating meals?: None Help from another person taking care of personal grooming?: A Little Help from another person toileting, which includes using toliet, bedpan, or urinal?: A Little Help from another person bathing (including washing, rinsing, drying)?: A Little Help from another person to put on and taking off regular upper body clothing?: None Help from another person to put on and taking off regular lower body clothing?: A Little 6 Click Score: 20    End of Session    Activity Tolerance: Patient tolerated treatment well Patient left: in chair;with call bell/phone within reach  OT Visit Diagnosis: Other abnormalities of gait and mobility (R26.89);Muscle weakness (generalized) (M62.81)                Time: 0600-4599 OT Time Calculation (min): 28 min Charges:  OT General Charges $OT Visit: 1 Visit OT Evaluation $OT Eval Moderate Complexity: 1 Mod OT Treatments $Self Care/Home Management : 8-22 mins  Dessie Coma, M.S. OTR/L  08/15/22, 3:18 PM  ascom [phone]

## 2022-08-16 DIAGNOSIS — E1100 Type 2 diabetes mellitus with hyperosmolarity without nonketotic hyperglycemic-hyperosmolar coma (NKHHC): Secondary | ICD-10-CM

## 2022-08-16 DIAGNOSIS — N170 Acute kidney failure with tubular necrosis: Secondary | ICD-10-CM | POA: Diagnosis not present

## 2022-08-16 DIAGNOSIS — N1831 Chronic kidney disease, stage 3a: Secondary | ICD-10-CM

## 2022-08-16 DIAGNOSIS — U071 COVID-19: Secondary | ICD-10-CM | POA: Diagnosis not present

## 2022-08-16 DIAGNOSIS — J9601 Acute respiratory failure with hypoxia: Secondary | ICD-10-CM

## 2022-08-16 DIAGNOSIS — N179 Acute kidney failure, unspecified: Secondary | ICD-10-CM

## 2022-08-16 HISTORY — DX: Acute respiratory failure with hypoxia: J96.01

## 2022-08-16 LAB — C-REACTIVE PROTEIN: CRP: 1 mg/dL — ABNORMAL HIGH (ref <1.0)

## 2022-08-16 LAB — GLUCOSE, CAPILLARY
Glucose-Capillary: 110 mg/dL — ABNORMAL HIGH (ref 70–99)
Glucose-Capillary: 121 mg/dL — ABNORMAL HIGH (ref 70–99)
Glucose-Capillary: 186 mg/dL — ABNORMAL HIGH (ref 70–99)
Glucose-Capillary: 249 mg/dL — ABNORMAL HIGH (ref 70–99)
Glucose-Capillary: 302 mg/dL — ABNORMAL HIGH (ref 70–99)
Glucose-Capillary: 304 mg/dL — ABNORMAL HIGH (ref 70–99)
Glucose-Capillary: 381 mg/dL — ABNORMAL HIGH (ref 70–99)

## 2022-08-16 LAB — BASIC METABOLIC PANEL
Anion gap: 11 (ref 5–15)
BUN: 27 mg/dL — ABNORMAL HIGH (ref 8–23)
CO2: 26 mmol/L (ref 22–32)
Calcium: 8.7 mg/dL — ABNORMAL LOW (ref 8.9–10.3)
Chloride: 99 mmol/L (ref 98–111)
Creatinine, Ser: 1.16 mg/dL — ABNORMAL HIGH (ref 0.44–1.00)
GFR, Estimated: 47 mL/min — ABNORMAL LOW (ref >60)
Glucose, Bld: 156 mg/dL — ABNORMAL HIGH (ref 70–99)
Potassium: 3.7 mmol/L (ref 3.5–5.1)
Sodium: 136 mmol/L (ref 135–145)

## 2022-08-16 LAB — CBC
HCT: 39 % (ref 36.0–46.0)
Hemoglobin: 13.4 g/dL (ref 12.0–15.0)
MCH: 30.2 pg (ref 26.0–34.0)
MCHC: 34.4 g/dL (ref 30.0–36.0)
MCV: 88 fL (ref 80.0–100.0)
Platelets: 226 10*3/uL (ref 150–400)
RBC: 4.43 MIL/uL (ref 3.87–5.11)
RDW: 13.8 % (ref 11.5–15.5)
WBC: 11.4 10*3/uL — ABNORMAL HIGH (ref 4.0–10.5)
nRBC: 0 % (ref 0.0–0.2)

## 2022-08-16 LAB — D-DIMER, QUANTITATIVE: D-Dimer, Quant: 2.39 ug/mL-FEU — ABNORMAL HIGH (ref 0.00–0.50)

## 2022-08-16 LAB — FERRITIN: Ferritin: 169 ng/mL (ref 11–307)

## 2022-08-16 MED ORDER — BUTALBITAL-APAP-CAFFEINE 50-325-40 MG PO TABS
1.0000 | ORAL_TABLET | ORAL | Status: DC | PRN
  Administered 2022-08-16 – 2022-08-17 (×2): 1 via ORAL
  Filled 2022-08-16 (×2): qty 1

## 2022-08-16 MED ORDER — LABETALOL HCL 5 MG/ML IV SOLN: 10.0000 mg | INTRAVENOUS | Status: DC | PRN

## 2022-08-16 MED ORDER — MAGNESIUM SULFATE 2 GM/50ML IV SOLN
2.0000 g | Freq: Once | INTRAVENOUS | Status: AC
  Administered 2022-08-16: 2 g via INTRAVENOUS
  Filled 2022-08-16: qty 50

## 2022-08-16 MED ORDER — AMLODIPINE BESYLATE 5 MG PO TABS
5.0000 mg | ORAL_TABLET | Freq: Every day | ORAL | Status: DC
  Administered 2022-08-16 – 2022-08-17 (×2): 5 mg via ORAL
  Filled 2022-08-16 (×2): qty 1

## 2022-08-16 MED ORDER — INSULIN ASPART 100 UNIT/ML IJ SOLN
0.0000 [IU] | INTRAMUSCULAR | Status: DC
  Administered 2022-08-16: 2 [IU] via SUBCUTANEOUS
  Administered 2022-08-16: 9 [IU] via SUBCUTANEOUS
  Administered 2022-08-16: 1 [IU] via SUBCUTANEOUS
  Administered 2022-08-16 (×2): 7 [IU] via SUBCUTANEOUS
  Administered 2022-08-16: 3 [IU] via SUBCUTANEOUS
  Administered 2022-08-17: 7 [IU] via SUBCUTANEOUS
  Administered 2022-08-17: 1 [IU] via SUBCUTANEOUS
  Filled 2022-08-16 (×7): qty 1

## 2022-08-16 NOTE — Plan of Care (Signed)

## 2022-08-16 NOTE — Inpatient Diabetes Management (Signed)
Inpatient Diabetes Program Recommendations  AACE/ADA: New Consensus Statement on Inpatient Glycemic Control (2015)  Target Ranges:  Prepandial:   less than 140 mg/dL      Peak postprandial:   less than 180 mg/dL (1-2 hours)      Critically ill patients:  140 - 180 mg/dL   Lab Results  Component Value Date   GLUCAP 121 (H) 08/16/2022   HGBA1C 6.9 (H) 08/15/2022    Review of Glycemic Control  Latest Reference Range & Units 08/16/22 00:52 08/16/22 02:55 08/16/22 08:16  Glucose-Capillary 70 - 99 mg/dL 302 (H) 249 (H) 121 (H)  (H): Data is abnormally high Diabetes history: Type 1 DM Outpatient Diabetes medications: Tslim Current orders for Inpatient glycemic control: Semglee 10 units QD, Novolog 0-9 units Q4H  Insulin Pump settings are as follows: Basal rates Midnight = 0.45 6 AM = 0.625 9 AM = 0.625 2 PM = 0.625 6 PM = 0.45 TDD basal: 12.9 units  Bolus settings I/C: 9 at midnight, 12 at 2 PM ISF: 52 at midnight, 55 at 9 AM Target Glucose: 110 at midnight, 130 at 6 PM Active insulin time: 4 hours   Inpatient Diabetes Program Recommendations:    Consider adding Novolog 3 units TID (assuming patient is consuming >50% of meals).   Thanks, Bronson Curb, MSN, RNC-OB Diabetes Coordinator [phone] (8a-5p)

## 2022-08-16 NOTE — Hospital Course (Addendum)
This is a 81 year old pleasant female with a history of hypothyroidism, osteoarthritis, diabetes with insulin dependence.  She was on insulin pump. She was admitted to the hospital on 12/28 with complaints of flulike symptoms.  She was positive for COVID, upon arriving the hospital, she had a severe hyperglycemia with AKI. She was treated with insulin infusion, fluids in ICU setting.  He is transferred to medical floor on 9/29.

## 2022-08-16 NOTE — Progress Notes (Signed)
  Progress Note   Patient: Elizabeth [name] TKP:[id number] DOB: 05-10-1941 DOA: 08/14/2022     2 DOS: the patient was seen and examined on 08/16/2022   Brief hospital course: This is a 81 year old pleasant female with a history of hypothyroidism, osteoarthritis, diabetes with insulin dependence.  She was on insulin pump. She was admitted to the hospital on 12/28 with complaints of flulike symptoms.  She was positive for COVID, upon arriving the hospital, she had a severe hyperglycemia with AKI. She was treated with insulin infusion, fluids in ICU setting.  He is transferred to medical floor on 9/29.  Assessment and Plan:  Uncontrolled type 2 diabetes with hyperosmolality. This is a due to COVID infection, patient hemoglobin A1c is 6.9. I will continue current insulin glargine to 10 units daily, continue sliding scale insulin.  Acute hypoxemic respiratory failure. COVID-19 infection. Initially had a significant hypoxia, this was associated with hyperosmolality.  I reviewed the patient chest CT scan, there is no evidence of any pneumonia. Currently patient does not have any respiratory symptoms.  I will hold off steroids as patient has significant hyperglycemia.    Acute kidney injury on chronic kidney disease stage IIIa. Reviewed lab from care everywhere, patient baseline creatinine 1.4, patient had acute on chronic kidney disease. Renal function has improved, discontinue IV fluids.  Essential hypertension. Blood pressure running high, add lower dose amlodipine.      Subjective:  Patient feels much better today, denies any short of breath or cough.  Off oxygen. No abdominal pain nausea vomiting.  Physical Exam: Vitals:   08/16/22 0400 08/16/22 0500 08/16/22 0600 08/16/22 0700  BP: (!) 176/63 (!) 180/67 (!) 181/68 (!) 178/78  Pulse: (!) 56 60 (!) 56 (!) 52  Resp: '14 14 13 13  '$ Temp: 98.9 F (37.2 C)     TempSrc: Oral     SpO2: 93% 95% 91% 94%  Weight:      Height:        General exam: Appears calm and comfortable  Respiratory system: Clear to auscultation. Respiratory effort normal. Cardiovascular system: S1 & S2 heard, RRR. No JVD, murmurs, rubs, gallops or clicks. No pedal edema. Gastrointestinal system: Abdomen is nondistended, soft and nontender. No organomegaly or masses felt. Normal bowel sounds heard. Central nervous system: Alert and oriented. No focal neurological deficits. Extremities: Symmetric 5 x 5 power. Skin: No rashes, lesions or ulcers Psychiatry: Judgement and insight appear normal. Mood & affect appropriate.   Data Reviewed:  Reviewed chest CT scan results and images, reviewed all lab results.  Family Communication:   Disposition: Status is: Inpatient Remains inpatient appropriate because: Severity of disease.  Planned Discharge Destination: Home    Time spent: 50 minutes  Author: Sharen Hones, MD 08/16/2022 11:32 AM  For on call review [email or web address].

## 2022-08-17 DIAGNOSIS — J9601 Acute respiratory failure with hypoxia: Secondary | ICD-10-CM | POA: Diagnosis not present

## 2022-08-17 DIAGNOSIS — N1831 Chronic kidney disease, stage 3a: Secondary | ICD-10-CM | POA: Diagnosis not present

## 2022-08-17 DIAGNOSIS — N170 Acute kidney failure with tubular necrosis: Secondary | ICD-10-CM | POA: Diagnosis not present

## 2022-08-17 DIAGNOSIS — U071 COVID-19: Secondary | ICD-10-CM | POA: Diagnosis not present

## 2022-08-17 LAB — CBC
HCT: 41.4 % (ref 36.0–46.0)
Hemoglobin: 13.7 g/dL (ref 12.0–15.0)
MCH: 28.8 pg (ref 26.0–34.0)
MCHC: 33.1 g/dL (ref 30.0–36.0)
MCV: 87.2 fL (ref 80.0–100.0)
Platelets: 223 10*3/uL (ref 150–400)
RBC: 4.75 MIL/uL (ref 3.87–5.11)
RDW: 13.6 % (ref 11.5–15.5)
WBC: 14.7 10*3/uL — ABNORMAL HIGH (ref 4.0–10.5)
nRBC: 0 % (ref 0.0–0.2)

## 2022-08-17 LAB — BASIC METABOLIC PANEL
Anion gap: 8 (ref 5–15)
BUN: 18 mg/dL (ref 8–23)
CO2: 28 mmol/L (ref 22–32)
Calcium: 8.6 mg/dL — ABNORMAL LOW (ref 8.9–10.3)
Chloride: 98 mmol/L (ref 98–111)
Creatinine, Ser: 1.07 mg/dL — ABNORMAL HIGH (ref 0.44–1.00)
GFR, Estimated: 52 mL/min — ABNORMAL LOW (ref >60)
Glucose, Bld: 213 mg/dL — ABNORMAL HIGH (ref 70–99)
Potassium: 4.3 mmol/L (ref 3.5–5.1)
Sodium: 134 mmol/L — ABNORMAL LOW (ref 135–145)

## 2022-08-17 LAB — C-REACTIVE PROTEIN: CRP: 1.6 mg/dL — ABNORMAL HIGH

## 2022-08-17 LAB — GLUCOSE, CAPILLARY
Glucose-Capillary: 141 mg/dL — ABNORMAL HIGH (ref 70–99)
Glucose-Capillary: 306 mg/dL — ABNORMAL HIGH (ref 70–99)
Glucose-Capillary: 341 mg/dL — ABNORMAL HIGH (ref 70–99)

## 2022-08-17 LAB — MAGNESIUM: Magnesium: 2.3 mg/dL (ref 1.7–2.4)

## 2022-08-17 LAB — D-DIMER, QUANTITATIVE: D-Dimer, Quant: 1.86 ug{FEU}/mL — ABNORMAL HIGH (ref 0.00–0.50)

## 2022-08-17 LAB — FERRITIN: Ferritin: 144 ng/mL (ref 11–307)

## 2022-08-17 MED ORDER — INSULIN ASPART 100 UNIT/ML IJ SOLN
0.0000 [IU] | Freq: Three times a day (TID) | INTRAMUSCULAR | Status: DC
  Administered 2022-08-17: 7 [IU] via SUBCUTANEOUS
  Filled 2022-08-17: qty 1

## 2022-08-17 MED ORDER — LACTULOSE 10 GM/15ML PO SOLN
20.0000 g | Freq: Once | ORAL | Status: AC
  Administered 2022-08-17: 20 g via ORAL
  Filled 2022-08-17: qty 30

## 2022-08-17 MED ORDER — INSULIN ASPART 100 UNIT/ML IJ SOLN
3.0000 [IU] | Freq: Three times a day (TID) | INTRAMUSCULAR | Status: DC
  Administered 2022-08-17: 3 [IU] via SUBCUTANEOUS
  Filled 2022-08-17: qty 1

## 2022-08-17 NOTE — TOC Initial Note (Addendum)
Transition of Care [facility]) - Initial/Assessment Note    Patient Details  Name: Elizabeth [name] MRN: [id number] Date of Birth: Jul 17, 1941  [facility] CM/SW Contact:    Elliot Gurney [place], [place] Phone Number: 08/17/2022, 11:26 AM  Clinical Narrative:                 Patient to discharge home with spouse today.  Spoke to patient by phone to discuss [facility] recommendation for PT. Patient agreeable to [facility] services. Per patient, she has no preference. Patient states that she has a wheelchair, cain and walker at home. Phone call to [facility], spoke with Corene Cornea who will call this social worker back to confirm that he would be able to accept patient for PT [facility] services.  12:15pm Return call from [place], he was unable to  accept patient. Phone call to [facility], they will check to see if they can accept patient and will call this social worker back. Phone call to [facility], [facility] is unable to accept patient, awaiting a call back from [place].  12:43pm Tommi Rumps form [facility] accepted patient for PT services. Phone call made to patient to confirm that [facility] has accepted her for PT services.  Amron Guerrette, LCSW Transition of Care [phone]   Expected Discharge Plan: [place] Barriers to Discharge: No Barriers Identified   Patient Goals and CMS Choice Patient states their goals for this hospitalization and ongoing recovery are:: "I wan to get my legs stronger"   Choice offered to / list presented to : Patient  Expected Discharge Plan and Services Expected Discharge Plan: [place] In-house Referral: Clinical Social Work   Post Acute Care Choice: Durable Medical Equipment (walker, wheelchair, cain) Living arrangements for the past 2 months: Single Family Home Expected Discharge Date: 08/17/22                                    Prior Living Arrangements/Services Living arrangements for the past 2  months: Single Family Home Lives with:: Spouse   Do you feel safe going back to the place where you live?: Yes      Need for Family Participation in Patient Care: Yes (Comment) Care giver support system in place?: Yes (comment)   Criminal Activity/Legal Involvement Pertinent to Current Situation/Hospitalization: No - Comment as needed  Activities of Daily Living Home Assistive Devices/Equipment: CBG Meter ADL Screening (condition at time of admission) Patient's cognitive ability adequate to safely complete daily activities?: Yes Is the patient deaf or have difficulty hearing?: No Does the patient have difficulty seeing, even when wearing glasses/contacts?: No Does the patient have difficulty concentrating, remembering, or making decisions?: No Patient able to express need for assistance with ADLs?: Yes Does the patient have difficulty dressing or bathing?: No Independently performs ADLs?: Yes (appropriate for developmental age) Does the patient have difficulty walking or climbing stairs?: No Weakness of Legs: None Weakness of Arms/Hands: None  Permission Sought/Granted                  Emotional Assessment   Attitude/Demeanor/Rapport: Engaged, Self-Confident Affect (typically observed): Accepting, Adaptable Orientation: : Oriented to Self, Oriented to Place, Oriented to  Time, Oriented to Situation Alcohol / Substance Use: Not Applicable Psych Involvement: No (comment)  Admission diagnosis:  COVID [U07.1] Patient Active Problem List   Diagnosis Date Noted   Diabetes mellitus type 2  with hyperosmolarity, uncontrolled ([place]) 08/16/2022   Acute renal failure superimposed on stage 3a chronic kidney disease ([place]) 08/16/2022   Acute hypoxemic respiratory failure ([place]) 08/16/2022   COVID 08/14/2022   Hyperlipidemia, unspecified 02/03/2018   Hypothyroidism, unspecified 02/03/2018   TMJ (temporomandibular joint disorder) 02/03/2018   S/P total knee arthroplasty 02/03/2018    Primary osteoarthritis of left knee 12/13/2017   B12 deficiency 07/11/2014   Hypoglycemia associated with diabetes ([place]) 07/09/2014   Insulin pump titration 07/09/2014   LADA (latent autoimmune diabetes in adults), managed as type 1 ([place]) 07/09/2014   PCP:  Baxter Hire, MD Pharmacy:   [facility] [address] [place] [address] Phone: [phone] Fax: [phone]  [facility] [place], [place] - [place] [address] Phone: [phone] Fax: [phone]     Social Determinants of Health (SDOH) Interventions    Readmission Risk Interventions     No data to display

## 2022-08-17 NOTE — Discharge Summary (Signed)
Physician Discharge Summary   Patient: Elizabeth [name] MRN: [id number] DOB: [birth year]  Admit date:     08/14/2022  Discharge date: 08/17/22  Discharge Physician: Sharen Hones   PCP: Baxter Hire, MD   Recommendations at discharge:   Follow-up with PCP in 1 week.  Discharge Diagnoses: Principal Problem:   COVID Active Problems:   B12 deficiency   Hypothyroidism, unspecified   Diabetes mellitus type 2 with hyperosmolarity, uncontrolled ([place])   Acute renal failure superimposed on stage 3a chronic kidney disease ([place])   Acute hypoxemic respiratory failure ([facility])  Resolved Problems:   * No resolved hospital problems. [facility] Course: This is a 81 year old pleasant female with a history of hypothyroidism, osteoarthritis, diabetes with insulin dependence.  She was on insulin pump. She was admitted to the hospital on 12/28 with complaints of flulike symptoms.  She was positive for COVID, upon arriving the hospital, she had a severe hyperglycemia with AKI. She was treated with insulin infusion, fluids in ICU setting.  He is transferred to medical floor on 9/29.  Assessment and Plan: Uncontrolled type 2 diabetes with hyperosmolality. This is a due to COVID infection, patient hemoglobin A1c is 6.9. But the patient came back with a severe hyperglycemia, this could be from [place].  Another explanation could be that the tip of the catheter for insulin pump came out.  But given patient A1c below 6.9, she was doing well before this.  Discussed with patient daughter and husband, patient was quite skilled with her insulin pump.  At this point, I will restart her original setting with her insulin pump.  She can follow-up with her PCP as outpatient.   Acute hypoxemic respiratory failure. COVID-19 infection. Initially had a significant hypoxia, this was associated with hyperosmolality.  I reviewed the patient chest CT scan, there is no evidence of any pneumonia. Currently patient does not  have any respiratory symptoms.  She did not receive any steroid due to severe hyperglycemia.  Currently he is doing well. Acute kidney injury on chronic kidney disease stage IIIa. Reviewed lab from care everywhere, patient baseline creatinine 1.4, patient had acute on chronic kidney disease. Renal function has improved.   Essential hypertension. Patient blood pressure is higher while in the hospital setting, she will follow-up with PCP in the near future, may consider add a blood pressure medicine if blood pressure continue to be high while at home.         Consultants: ICU Procedures performed: None  Disposition: Home health Diet recommendation:  Discharge Diet Orders (From admission, onward)     Start     Ordered   08/17/22 0000  Diet Carb Modified        08/17/22 1016           Carb modified diet DISCHARGE MEDICATION: Allergies as of 08/17/2022       Reactions   Neosporin [neomycin-bacitracin Zn-polymyx] Other (See Comments)   Skin rash or redness   Ceclor [cefaclor] Other (See Comments)   Unsure of reaction type   Other    Other reaction(s): Other (See Comments) sulfasaline-Does not remember   Polysorbate Other (See Comments)   Relafen [nabumetone] Other (See Comments)   Unsure of reaction type   Sulfasalazine Other (See Comments)   Unsure of reaction type   Amoxicillin Nausea And Vomiting   Has patient had a PCN reaction causing immediate rash, facial/tongue/throat swelling, SOB or lightheadedness with hypotension: No Has patient had a PCN reaction causing severe rash involving  mucus membranes or skin necrosis:No Has patient had a PCN reaction that required hospitalization: No Has patient had a PCN reaction occurring within the last 10 years: No If all of the above answers are "NO", then may proceed with Cephalosporin use.   Band-aid Plus Antibiotic [bacitracin-polymyxin B] Other (See Comments)   ADHESIVE, NOT ANTIBIOTIC Can use paper tape PULLS SKIN  OFF Other reaction(s): Other (See Comments) PULLS SKIN OFF PULLS SKIN OFF   Clindamycin Rash   Niacin    Other reaction(s): Other (See Comments) flushing   Niaspan [niacin Er] Other (See Comments)   FLUSHING   Penicillin V Potassium Other (See Comments)   Has patient had a PCN reaction causing immediate rash, facial/tongue/throat swelling, SOB or lightheadedness with hypotension: Unknown Has patient had a PCN reaction causing severe rash involving mucus membranes or skin necrosis: Unknown Has patient had a PCN reaction that required hospitalization: No Has patient had a PCN reaction occurring within the last 10 years: No If all of the above answers are "NO", then may proceed with Cephalosporin use.   Penicillins Nausea And Vomiting   Happened so long ago and patient is not sure of reaction   Statins Other (See Comments)   Other reaction(s): Other (See Comments) arthralgia ARTHRALGIA arthralgia        Medication List     STOP taking these medications    celecoxib 200 MG capsule Commonly known as: CELEBREX   diphenhydrAMINE 25 MG tablet Commonly known as: SOMINEX   enoxaparin 40 MG/0.4ML injection Commonly known as: LOVENOX   hydroxypropyl methylcellulose / hypromellose 2.5 % ophthalmic solution Commonly known as: ISOPTO TEARS / GONIOVISC   oxyCODONE 5 MG immediate release tablet Commonly known as: Oxy IR/ROXICODONE   Paxlovid (150/100) 10 x 150 MG & 10 x '100MG'$  Tbpk Generic drug: nirmatrelvir & ritonavir   promethazine 25 MG tablet Commonly known as: PHENERGAN   traMADol 50 MG tablet Commonly known as: ULTRAM       TAKE these medications    Biotin 10000 MCG Tabs Take 10,000 mcg by mouth daily at 12 noon.   CALCIUM 600+D3 PO Take 2 tablets by mouth daily.   cyanocobalamin 1000 MCG tablet Commonly known as: VITAMIN B12 Take 1,000 mcg by mouth daily at 12 noon.   glucagon 1 MG injection Inject 1 mg into the muscle daily as needed (for low blood  sugars). Reported on 02/21/2016   glucose 5 g chewable tablet Chew 15 g by mouth 3 (three) times daily as needed for low blood sugar.   insulin pump Soln Inject into the skin. NOVOLOG 100 UNIT/ML injection   levothyroxine 100 MCG tablet Commonly known as: SYNTHROID Take 100 mcg by mouth daily before breakfast.   levothyroxine 175 MCG tablet Commonly known as: SYNTHROID Take 87.5 mcg by mouth every morning.   Melatonin 10 MG Tabs Take 10 mg by mouth at bedtime.   NovoLOG 100 UNIT/ML injection Generic drug: insulin aspart SMARTSIG:0-60 Unit(s) SUB-Q Daily   pseudoephedrine-guaifenesin 60-600 MG 12 hr tablet Commonly known as: MUCINEX D Take 1 tablet by mouth 2 (two) times daily as needed for congestion.   rosuvastatin 10 MG tablet Commonly known as: CRESTOR Take 10 mg by mouth at bedtime.        Follow-up Information     Baxter Hire, MD Follow up in 1 week(s).   Specialty: Internal Medicine Contact information: [place]  [address] [phone]  Discharge Exam: Filed Weights   08/14/22 1407 08/15/22 0426 08/17/22 0454  Weight: 73.1 kg 73.1 kg 70.6 kg   General exam: Appears calm and comfortable  Respiratory system: Clear to auscultation. Respiratory effort normal. Cardiovascular system: S1 & S2 heard, RRR. No JVD, murmurs, rubs, gallops or clicks. No pedal edema. Gastrointestinal system: Abdomen is nondistended, soft and nontender. No organomegaly or masses felt. Normal bowel sounds heard. Central nervous system: Alert and oriented. No focal neurological deficits. Extremities: Symmetric 5 x 5 power. Skin: No rashes, lesions or ulcers Psychiatry: Judgement and insight appear normal. Mood & affect appropriate.    Condition at discharge: good  The results of significant diagnostics from this hospitalization (including imaging, microbiology, ancillary and laboratory) are listed below for reference.   Imaging  Studies: CT CHEST ABDOMEN PELVIS WO CONTRAST  Result Date: 08/14/2022 CLINICAL DATA:  Sepsis.  COVID positive. EXAM: CT CHEST, ABDOMEN AND PELVIS WITHOUT CONTRAST TECHNIQUE: Multidetector CT imaging of the chest, abdomen and pelvis was performed following the standard protocol without IV contrast. RADIATION DOSE REDUCTION: This exam was performed according to the departmental dose-optimization program which includes automated exposure control, adjustment of the mA and/or kV according to patient size and/or use of iterative reconstruction technique. COMPARISON:  CT scan from 2010 FINDINGS: CT CHEST FINDINGS Cardiovascular: The heart is normal in size. No pericardial effusion. There is mild tortuosity and moderate atherosclerotic calcification of the thoracic aorta. No focal aneurysm. Three-vessel coronary artery calcifications are noted. Mediastinum/Nodes: No mediastinal or hilar mass or lymphadenopathy. Small scattered lymph nodes are noted. The esophagus is grossly normal. Lungs/Pleura: Azygous fissure is noted. Mild emphysematous changes with areas of paraseptal bulla. No pulmonary infiltrates or pleural effusions. No pulmonary edema. Patchy areas bronchiectasis. Musculoskeletal: No breast masses, supraclavicular or axillary adenopathy. The bony thorax is intact. CT ABDOMEN PELVIS FINDINGS Hepatobiliary: No hepatic lesions or intrahepatic biliary dilatation. The gallbladder is surgically absent. No common bile duct dilatation. Pancreas: No mass, inflammation or ductal dilatation. Spleen: Normal size.  No focal lesions. Adrenals/Urinary Tract: The adrenal glands are unremarkable. There is a rim calcified cyst projecting off the lower pole region of the right kidney. This measures a maximum of 3.8 cm and measures 19 Hounsfield units. This was present on the prior study from 2010 but measured 9 cm on that study. No further imaging evaluation follow-up is necessary. No renal or obstructing ureteral calculi. The  bladder contains a Foley catheter Stomach/Bowel: The stomach, duodenum small bowel and colon are grossly. No acute inflammatory process, mass lesions or obstructive findings. The terminal ileum and appendix are. Vascular/Lymphatic: Advanced atherosclerotic calcification involving the aorta and iliac arteries but no aneurysm. No mesenteric or retroperitoneal mass or adenopathy. Reproductive: Surgically absent. Other: No pelvic mass or adenopathy. No free pelvic fluid collections. No inguinal mass or adenopathy. No abdominal wall hernia or subcutaneous lesions. Musculoskeletal: No significant bony findings. Moderate degenerative changes involving the lumbar spine. IMPRESSION: 1. No acute abdominal/pelvic findings, mass lesions or adenopathy. 2. Mild emphysematous changes with areas of paraseptal bulla. No pulmonary infiltrates or pleural effusions. 3. Status post cholecystectomy. No biliary dilatation. 4. Advanced atherosclerotic calcification involving the thoracic and abdominal aorta and iliac arteries. Electronically Signed   By: Marijo Sanes M.D.   On: 08/14/2022 12:20   DG Chest Port 1 View  Result Date: 08/14/2022 CLINICAL DATA:  Syncope EXAM: PORTABLE CHEST 1 VIEW COMPARISON:  01/23/2009 FINDINGS: The heart size and mediastinal contours are within normal limits. Both lungs are clear. The visualized  skeletal structures are unremarkable. IMPRESSION: No active disease. Electronically Signed   By: Kathreen Devoid M.D.   On: 08/14/2022 10:00    Microbiology: Results for orders placed or performed during the hospital encounter of 08/14/22  Blood culture (routine x 2)     Status: None (Preliminary result)   Collection Time: 08/14/22 11:00 AM   Specimen: BLOOD LEFT FOREARM  Result Value Ref Range Status   Specimen Description BLOOD LEFT FOREARM  Final   Special Requests   Final    BOTTLES DRAWN AEROBIC AND ANAEROBIC Blood Culture results may not be optimal due to an inadequate volume of blood received in  culture bottles   Culture   Final    NO GROWTH 3 DAYS Performed at [facility], [address] [place], [place] [address]    Report Status PENDING  Incomplete  Blood culture (routine x 2)     Status: None (Preliminary result)   Collection Time: 08/14/22 11:22 AM   Specimen: BLOOD LEFT FOREARM  Result Value Ref Range Status   Specimen Description BLOOD LEFT FOREARM  Final   Special Requests   Final    BOTTLES DRAWN AEROBIC AND ANAEROBIC Blood Culture results may not be optimal due to an inadequate volume of blood received in culture bottles   Culture   Final    NO GROWTH 3 DAYS Performed at [facility], [address] [place], [place] [address]    Report Status PENDING  Incomplete  MRSA Next Gen by PCR, Nasal     Status: None   Collection Time: 08/14/22  2:44 PM   Specimen: Nasal Mucosa; Nasal Swab  Result Value Ref Range Status   MRSA by PCR Next Gen NOT DETECTED NOT DETECTED Final    Comment: (NOTE) The GeneXpert MRSA Assay (FDA approved for NASAL specimens only), is one component of a comprehensive MRSA colonization surveillance program. It is not intended to diagnose MRSA infection nor to guide or monitor treatment for MRSA infections. Test performance is not FDA approved in patients less than 64 years old. Performed at [facility], [address] [place], [place] [address]     Labs: CBC: Recent Labs  Lab 08/14/22 [phone] 08/15/22 0432 08/16/22 0806 08/17/22 0634  WBC 11.3* 16.3* 11.4* 14.7*  NEUTROABS 10.4*  --   --   --   HGB 14.3 11.9* 13.4 13.7  HCT 44.3 35.4* 39.0 41.4  MCV 90.8 87.0 88.0 87.2  PLT 252 227 226 778   Basic Metabolic Panel: Recent Labs  Lab 08/14/22 1432 08/14/22 1545 08/14/22 2011 08/15/22 0104 08/15/22 0432 08/16/22 0532 08/17/22 0634  NA  --    < > 136 138 137 136 134*  K  --    < > 4.0 4.3 4.0 3.7 4.3  CL  --    < > 99 102 105 99 98  CO2  --    < > '22 26 24 26 28  '$ GLUCOSE  --    < > 318* 200*  129* 156* 213*  BUN  --    < > 45* 45* 45* 27* 18  CREATININE  --    < > 2.13* 1.99* 1.73* 1.16* 1.07*  CALCIUM  --    < > 8.5* 8.3* 8.2* 8.7* 8.6*  MG 1.9  --   --   --  1.9  --  2.3  PHOS 4.8*  --   --   --  3.8  --   --    < > =  values in this interval not displayed.   Liver Function Tests: Recent Labs  Lab 08/14/22 0844  AST 42*  ALT 24  ALKPHOS 94  BILITOT 2.8*  PROT 7.1  ALBUMIN 3.7   CBG: Recent Labs  Lab 08/16/22 1640 08/16/22 1958 08/16/22 2356 08/17/22 0451 08/17/22 0758  GLUCAP 304* 186* 110* 141* 306*    Discharge time spent: greater than 30 minutes.  Signed: Sharen Hones, MD [facility] 08/17/2022

## 2022-08-18 DIAGNOSIS — E109 Type 1 diabetes mellitus without complications: Secondary | ICD-10-CM | POA: Diagnosis not present

## 2022-08-19 LAB — CULTURE, BLOOD (ROUTINE X 2)
Culture: NO GROWTH
Culture: NO GROWTH

## 2022-08-21 DIAGNOSIS — U071 COVID-19: Secondary | ICD-10-CM | POA: Diagnosis not present

## 2022-08-21 DIAGNOSIS — Z09 Encounter for follow-up examination after completed treatment for conditions other than malignant neoplasm: Secondary | ICD-10-CM | POA: Diagnosis not present

## 2022-08-21 DIAGNOSIS — R300 Dysuria: Secondary | ICD-10-CM | POA: Diagnosis not present

## 2022-08-27 ENCOUNTER — Emergency Department: Payer: PPO

## 2022-08-27 ENCOUNTER — Inpatient Hospital Stay
Admission: EM | Admit: 2022-08-27 | Discharge: 2022-08-30 | DRG: 871 | Disposition: A | Discharge status: Home / Self Care | Payer: PPO | Attending: Family Medicine | Admitting: Family Medicine

## 2022-08-27 ENCOUNTER — Other Ambulatory Visit: Payer: Self-pay

## 2022-08-27 DIAGNOSIS — J45909 Unspecified asthma, uncomplicated: Secondary | ICD-10-CM | POA: Diagnosis not present

## 2022-08-27 DIAGNOSIS — R091 Pleurisy: Secondary | ICD-10-CM

## 2022-08-27 DIAGNOSIS — J019 Acute sinusitis, unspecified: Secondary | ICD-10-CM | POA: Diagnosis not present

## 2022-08-27 DIAGNOSIS — J9601 Acute respiratory failure with hypoxia: Secondary | ICD-10-CM | POA: Diagnosis present

## 2022-08-27 DIAGNOSIS — Z96652 Presence of left artificial knee joint: Secondary | ICD-10-CM | POA: Diagnosis present

## 2022-08-27 DIAGNOSIS — Z79899 Other long term (current) drug therapy: Secondary | ICD-10-CM

## 2022-08-27 DIAGNOSIS — J189 Pneumonia, unspecified organism: Principal | ICD-10-CM

## 2022-08-27 DIAGNOSIS — E871 Hypo-osmolality and hyponatremia: Secondary | ICD-10-CM | POA: Diagnosis present

## 2022-08-27 DIAGNOSIS — Z9641 Presence of insulin pump (external) (internal): Secondary | ICD-10-CM

## 2022-08-27 DIAGNOSIS — E139 Other specified diabetes mellitus without complications: Secondary | ICD-10-CM | POA: Diagnosis present

## 2022-08-27 DIAGNOSIS — Z90710 Acquired absence of both cervix and uterus: Secondary | ICD-10-CM | POA: Diagnosis not present

## 2022-08-27 DIAGNOSIS — E785 Hyperlipidemia, unspecified: Secondary | ICD-10-CM | POA: Diagnosis present

## 2022-08-27 DIAGNOSIS — K219 Gastro-esophageal reflux disease without esophagitis: Secondary | ICD-10-CM | POA: Diagnosis present

## 2022-08-27 DIAGNOSIS — Z794 Long term (current) use of insulin: Secondary | ICD-10-CM | POA: Diagnosis not present

## 2022-08-27 DIAGNOSIS — J329 Chronic sinusitis, unspecified: Secondary | ICD-10-CM | POA: Diagnosis not present

## 2022-08-27 DIAGNOSIS — Z880 Allergy status to penicillin: Secondary | ICD-10-CM

## 2022-08-27 DIAGNOSIS — U099 Post covid-19 condition, unspecified: Secondary | ICD-10-CM | POA: Diagnosis not present

## 2022-08-27 DIAGNOSIS — E860 Dehydration: Secondary | ICD-10-CM | POA: Diagnosis not present

## 2022-08-27 DIAGNOSIS — R059 Cough, unspecified: Secondary | ICD-10-CM | POA: Diagnosis not present

## 2022-08-27 DIAGNOSIS — R112 Nausea with vomiting, unspecified: Secondary | ICD-10-CM | POA: Diagnosis not present

## 2022-08-27 DIAGNOSIS — Z881 Allergy status to other antibiotic agents status: Secondary | ICD-10-CM

## 2022-08-27 DIAGNOSIS — A419 Sepsis, unspecified organism: Principal | ICD-10-CM | POA: Diagnosis present

## 2022-08-27 DIAGNOSIS — N1832 Chronic kidney disease, stage 3b: Secondary | ICD-10-CM | POA: Diagnosis not present

## 2022-08-27 DIAGNOSIS — B9689 Other specified bacterial agents as the cause of diseases classified elsewhere: Secondary | ICD-10-CM

## 2022-08-27 DIAGNOSIS — E1022 Type 1 diabetes mellitus with diabetic chronic kidney disease: Secondary | ICD-10-CM | POA: Diagnosis not present

## 2022-08-27 DIAGNOSIS — R6520 Severe sepsis without septic shock: Secondary | ICD-10-CM | POA: Diagnosis present

## 2022-08-27 DIAGNOSIS — U071 COVID-19: Secondary | ICD-10-CM | POA: Diagnosis not present

## 2022-08-27 DIAGNOSIS — Z9049 Acquired absence of other specified parts of digestive tract: Secondary | ICD-10-CM | POA: Diagnosis not present

## 2022-08-27 DIAGNOSIS — Y95 Nosocomial condition: Secondary | ICD-10-CM | POA: Diagnosis present

## 2022-08-27 DIAGNOSIS — E039 Hypothyroidism, unspecified: Secondary | ICD-10-CM | POA: Diagnosis not present

## 2022-08-27 DIAGNOSIS — J029 Acute pharyngitis, unspecified: Secondary | ICD-10-CM | POA: Diagnosis not present

## 2022-08-27 DIAGNOSIS — J0100 Acute maxillary sinusitis, unspecified: Secondary | ICD-10-CM | POA: Diagnosis not present

## 2022-08-27 DIAGNOSIS — N179 Acute kidney failure, unspecified: Secondary | ICD-10-CM | POA: Diagnosis not present

## 2022-08-27 DIAGNOSIS — Z803 Family history of malignant neoplasm of breast: Secondary | ICD-10-CM

## 2022-08-27 DIAGNOSIS — J159 Unspecified bacterial pneumonia: Secondary | ICD-10-CM | POA: Diagnosis not present

## 2022-08-27 DIAGNOSIS — Z888 Allergy status to other drugs, medicaments and biological substances status: Secondary | ICD-10-CM

## 2022-08-27 DIAGNOSIS — R000 Tachycardia, unspecified: Secondary | ICD-10-CM | POA: Diagnosis not present

## 2022-08-27 DIAGNOSIS — R051 Acute cough: Secondary | ICD-10-CM | POA: Diagnosis not present

## 2022-08-27 HISTORY — DX: Sepsis, unspecified organism: A41.9

## 2022-08-27 HISTORY — DX: Other specified bacterial agents as the cause of diseases classified elsewhere: B96.89

## 2022-08-27 HISTORY — DX: Pneumonia, unspecified organism: J18.9

## 2022-08-27 LAB — CBC WITH DIFFERENTIAL/PLATELET
Abs Immature Granulocytes: 0.16 10*3/uL — ABNORMAL HIGH (ref 0.00–0.07)
Basophils Absolute: 0.1 10*3/uL (ref 0.0–0.1)
Basophils Relative: 0 %
Eosinophils Absolute: 0 10*3/uL (ref 0.0–0.5)
Eosinophils Relative: 0 %
HCT: 33.8 % — ABNORMAL LOW (ref 36.0–46.0)
Hemoglobin: 11.3 g/dL — ABNORMAL LOW (ref 12.0–15.0)
Immature Granulocytes: 1 %
Lymphocytes Relative: 1 %
Lymphs Abs: 0.2 10*3/uL — ABNORMAL LOW (ref 0.7–4.0)
MCH: 29.7 pg (ref 26.0–34.0)
MCHC: 33.4 g/dL (ref 30.0–36.0)
MCV: 88.9 fL (ref 80.0–100.0)
Monocytes Absolute: 1 10*3/uL (ref 0.1–1.0)
Monocytes Relative: 4 %
Neutro Abs: 27.6 10*3/uL — ABNORMAL HIGH (ref 1.7–7.7)
Neutrophils Relative %: 94 %
Platelets: 439 10*3/uL — ABNORMAL HIGH (ref 150–400)
RBC: 3.8 MIL/uL — ABNORMAL LOW (ref 3.87–5.11)
RDW: 14.1 % (ref 11.5–15.5)
Smear Review: NORMAL
WBC: 29.1 10*3/uL — ABNORMAL HIGH (ref 4.0–10.5)
nRBC: 0 % (ref 0.0–0.2)

## 2022-08-27 LAB — COMPREHENSIVE METABOLIC PANEL
ALT: 16 U/L (ref 0–44)
AST: 28 U/L (ref 15–41)
Albumin: 2.8 g/dL — ABNORMAL LOW (ref 3.5–5.0)
Alkaline Phosphatase: 112 U/L (ref 38–126)
Anion gap: 11 (ref 5–15)
BUN: 25 mg/dL — ABNORMAL HIGH (ref 8–23)
CO2: 22 mmol/L (ref 22–32)
Calcium: 8.4 mg/dL — ABNORMAL LOW (ref 8.9–10.3)
Chloride: 95 mmol/L — ABNORMAL LOW (ref 98–111)
Creatinine, Ser: 1.51 mg/dL — ABNORMAL HIGH (ref 0.44–1.00)
GFR, Estimated: 34 mL/min — ABNORMAL LOW (ref >60)
Glucose, Bld: 149 mg/dL — ABNORMAL HIGH (ref 70–99)
Potassium: 4.2 mmol/L (ref 3.5–5.1)
Sodium: 128 mmol/L — ABNORMAL LOW (ref 135–145)
Total Bilirubin: 1.3 mg/dL — ABNORMAL HIGH (ref 0.3–1.2)
Total Protein: 7.3 g/dL (ref 6.5–8.1)

## 2022-08-27 LAB — PROCALCITONIN: Procalcitonin: 1.36 ng/mL

## 2022-08-27 LAB — LACTIC ACID, PLASMA: Lactic Acid, Venous: 1.4 mmol/L (ref 0.5–1.9)

## 2022-08-27 MED ORDER — SODIUM CHLORIDE 0.9 % IV SOLN
2.0000 g | Freq: Once | INTRAVENOUS | Status: AC
  Administered 2022-08-27: 2 g via INTRAVENOUS
  Filled 2022-08-27: qty 12.5

## 2022-08-27 MED ORDER — SODIUM CHLORIDE 0.9 % IV BOLUS
500.0000 mL | Freq: Once | INTRAVENOUS | Status: AC
  Administered 2022-08-27: 500 mL via INTRAVENOUS

## 2022-08-27 MED ORDER — HYDROCODONE-ACETAMINOPHEN 5-325 MG PO TABS
1.0000 | ORAL_TABLET | Freq: Once | ORAL | Status: AC
  Administered 2022-08-28: 1 via ORAL
  Filled 2022-08-27: qty 1

## 2022-08-27 MED ORDER — ONDANSETRON 4 MG PO TBDP
4.0000 mg | ORAL_TABLET | Freq: Once | ORAL | Status: AC
  Administered 2022-08-28: 4 mg via ORAL
  Filled 2022-08-27: qty 1

## 2022-08-27 MED ORDER — VANCOMYCIN HCL 1750 MG/350ML IV SOLN
1750.0000 mg | Freq: Once | INTRAVENOUS | Status: AC
  Administered 2022-08-28: 1750 mg via INTRAVENOUS
  Filled 2022-08-27: qty 350

## 2022-08-27 NOTE — H&P (Signed)
History and Physical    Patient: Elizabeth [name] YQM:[id number] DOB: [birth year] DOA: 08/27/2022 DOS: the patient was seen and examined on 08/27/2022 PCP: Baxter Hire, MD  Patient coming from: Home  Chief Complaint:  Chief Complaint  Patient presents with   Emesis    HPI: Elizabeth [name] is a 81 y.o. 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  Patient states since her discharge she has had a sore throat and increased fatigue and no appetite.  For the past 3 to 4 days she developed a cough and anterior chest pain when coughing and low-grade fevers.  She saw an ENT who did nasolaryngoscopy and diagnosed her with sinusitis and started her on amoxicillin and a cough medication.  Nausea and vomiting started a few hours prior. ED course and data review: On arrival BP 87/42, temp 99.7, pulse 105, respirations 23 and O2 sat 84% on room air.  BP was fluid responsive to 104/45 with a 1.5 L fluid bolus and O2 sats increased to the mid 90s on 3 L O2.  Labs significant for WBC 29,000 with procalcitonin 1.36 lactic acid pending.  Hemoglobin 11.3, down from baseline of 13.7 about 2 weeks prior.  Sodium 128, creatinine at baseline at 1.51. EKG, personally viewed and interpreted showing sinus tachycardia at 104 with no acute ST-T wave changes.  Chest x-ray showing patchy left lower lobe opacity suspicious for pneumonia Patient started on cefepime and vancomycin and hospitalist consulted for admission.   Review of Systems: As mentioned in the history of present illness. All other systems reviewed and are negative.  Past Medical History:  Diagnosis  Date   Anemia    Arthritis    Asthma    with cough/cold but nothing now   Chronic kidney disease    due to type 1 diabetes   Diabetes mellitus without complication ([place]) 5284   on insulin pump. VERY BRITTLE. per patient, it can drop 140 points in an hour   GERD (gastroesophageal reflux disease)    H/O   Heart murmur    not followed by anyone   Hx of cervical spine surgery    Hyperlipidemia    Hypothyroidism    Insulin pump in place    PONV (postoperative nausea and vomiting)    NO PROBLEMS SINCE USING ZOFRAN INTRAOP   Past Surgical History:  Procedure Laterality Date   ABDOMINAL HYSTERECTOMY  1972   BLADDER SURGERY  2003   leaky bladder   BREAST BIOPSY Left 2015   neg   CATARACT EXTRACTION W/PHACO Right 02/21/2016   Procedure: CATARACT EXTRACTION PHACO AND INTRAOCULAR LENS PLACEMENT ([place]);  Surgeon: Leandrew Koyanagi, MD;  Location: [facility];  Service: Ophthalmology;  Laterality: Right;  Lot# [id number] HUS: 00:50.6AP%:17.0CDE: 8.56   CATARACT EXTRACTION W/PHACO Left 03/26/2016   Procedure: CATARACT EXTRACTION PHACO AND INTRAOCULAR LENS PLACEMENT ([place]) left eye;  Surgeon: Leandrew Koyanagi, MD;  Location: [facility];  Service: Ophthalmology;  Laterality: Left;  DIABETIC - insulin pump SYMFONY TORIC LENS   CHOLECYSTECTOMY  1995   COLONOSCOPY WITH PROPOFOL N/A 01/28/2016   Procedure: COLONOSCOPY WITH PROPOFOL;  Surgeon: Hulen Luster, MD;  Location: [facility];  Service: Gastroenterology;  Laterality: N/A;   KNEE ARTHROPLASTY Left 02/03/2018   Procedure: COMPUTER ASSISTED TOTAL KNEE ARTHROPLASTY;  Surgeon: Dereck Leep, MD;  Location: [facility];  Service: Orthopedics;  Laterality: Left;   KNEE ARTHROSCOPY Left 09/17/2015   Procedure: ARTHROSCOPY KNEE,partial medial menisectomy, chondroplasty medial patella femoral.;  Surgeon: Dereck Leep, MD;  Location: [facility];  Service: Orthopedics;  Laterality: Left;   mallet toe Bilateral 2013, 2018   2nd toes   MENISECTOMY Right     2011   NECK SURGERY  2010   2 discs.  has plate and screws in place   SEPTOPLASTY  1997   Social History:  reports that she has never smoked. She has never used smokeless tobacco. She reports that she does not drink alcohol and does not use drugs.  Allergies  Allergen Reactions   Neosporin [Neomycin-Bacitracin Zn-Polymyx] Other (See Comments)    Skin rash or redness   Ceclor [Cefaclor] Other (See Comments)    Unsure of reaction type   Other     Other reaction(s): Other (See Comments) sulfasaline-Does not remember   Polysorbate Other (See Comments)   Relafen [Nabumetone] Other (See Comments)    Unsure of reaction type   Sulfasalazine Other (See Comments)    Unsure of reaction type   Amoxicillin Nausea And Vomiting    Has patient had a PCN reaction causing immediate rash, facial/tongue/throat swelling, SOB or lightheadedness with hypotension: No Has patient had a PCN reaction causing severe rash involving mucus membranes or skin necrosis:No Has patient had a PCN reaction that required hospitalization: No Has patient had a PCN reaction occurring within the last 10 years: No If all of the above answers are "NO", then may proceed with Cephalosporin use.    Band-Aid Plus Antibiotic [Bacitracin-Polymyxin B] Other (See Comments)    ADHESIVE, NOT ANTIBIOTIC Can use paper tape PULLS SKIN OFF Other reaction(s): Other (See Comments) PULLS SKIN OFF PULLS SKIN OFF   Clindamycin Rash   Niacin     Other reaction(s): Other (See Comments) flushing   Niaspan [Niacin Er] Other (See Comments)    FLUSHING   Penicillin V Potassium Other (See Comments)    Has patient had a PCN reaction causing immediate rash, facial/tongue/throat swelling, SOB or lightheadedness with hypotension: Unknown Has patient had a PCN reaction causing severe rash involving mucus membranes or skin necrosis: Unknown Has patient had a PCN reaction that required hospitalization: No Has patient had a PCN reaction occurring  within the last 10 years: No If all of the above answers are "NO", then may proceed with Cephalosporin use.    Penicillins Nausea And Vomiting    Happened so long ago and patient is not sure of reaction   Statins Other (See Comments)    Other reaction(s): Other (See Comments) arthralgia ARTHRALGIA arthralgia    Family History  Problem Relation Age of Onset   Breast cancer Maternal Aunt 78   Breast cancer Paternal Aunt 78    Prior to Admission medications   Medication Sig Start Date End Date Taking? Authorizing Provider  Biotin 10000 MCG TABS Take 10,000 mcg by mouth daily at 12 noon.    [provider]  Calcium Carb-Cholecalciferol (CALCIUM 600+D3 PO) Take 2 tablets by mouth daily.    [provider]  glucagon 1 MG injection Inject 1 mg into the muscle daily as needed (for low blood sugars). Reported on 02/21/2016    [provider]  glucose 5 g chewable tablet Chew 15  g by mouth 3 (three) times daily as needed for low blood sugar.     [provider]  Insulin Human (INSULIN PUMP) SOLN Inject into the skin. NOVOLOG 100 UNIT/ML injection    [provider]  levothyroxine (SYNTHROID) 175 MCG tablet Take 87.5 mcg by mouth every morning. 05/28/22   [provider]  levothyroxine (SYNTHROID, LEVOTHROID) 100 MCG tablet Take 100 mcg by mouth daily before breakfast.  Patient not taking: Reported on 08/14/2022 04/12/14   [provider]  lidocaine (XYLOCAINE) 2 % solution Use as directed 5 mLs in the mouth or throat 4 (four) times daily as needed for pain. 08/21/22   [provider]  Melatonin 10 MG TABS Take 10 mg by mouth at bedtime.    [provider]  NOVOLOG 100 UNIT/ML injection SMARTSIG:0-60 Unit(s) SUB-Q Daily 05/26/22   [provider]  pseudoephedrine-guaifenesin (MUCINEX D) 60-600 MG 12 hr tablet Take 1 tablet by mouth 2 (two) times daily as needed for congestion.    [provider]   rosuvastatin (CRESTOR) 10 MG tablet Take 10 mg by mouth at bedtime.  04/12/14   [provider]  vitamin B-12 (CYANOCOBALAMIN) 1000 MCG tablet Take 1,000 mcg by mouth daily at 12 noon.    [provider]    Physical Exam: Vitals:   08/27/22 1934 08/27/22 2239 08/27/22 2300 08/27/22 2312  BP:  (!) 107/52 (!) 104/45   Pulse:  90 86   Resp:  19 (!) 21   Temp:    98.2 F (36.8 C)  TempSrc:    Oral  SpO2: 94% 96% 97%   Weight:       Physical Exam Vitals and nursing note reviewed.  Constitutional:      General: She is not in acute distress. HENT:     Head: Normocephalic and atraumatic.  Cardiovascular:     Rate and Rhythm: Normal rate and regular rhythm.     Heart sounds: Normal heart sounds.  Pulmonary:     Effort: Pulmonary effort is normal.     Breath sounds: Normal breath sounds.  Abdominal:     Palpations: Abdomen is soft.     Tenderness: There is no abdominal tenderness.  Neurological:     Mental Status: Mental status is at baseline.     Labs on Admission: I have personally reviewed following labs and imaging studies  CBC: Recent Labs  Lab 08/27/22 1921  WBC 29.1*  NEUTROABS 27.6*  HGB 11.3*  HCT 33.8*  MCV 88.9  PLT 299*   Basic Metabolic Panel: Recent Labs  Lab 08/27/22 1921  NA 128*  K 4.2  CL 95*  CO2 22  GLUCOSE 149*  BUN 25*  CREATININE 1.51*  CALCIUM 8.4*   GFR: Estimated Creatinine Clearance: 27.6 mL/min (A) (by C-G formula based on SCr of 1.51 mg/dL (H)). Liver Function Tests: Recent Labs  Lab 08/27/22 1921  AST 28  ALT 16  ALKPHOS 112  BILITOT 1.3*  PROT 7.3  ALBUMIN 2.8*   No results for input(s): "LIPASE", "AMYLASE" in the last 168 hours. No results for input(s): "AMMONIA" in the last 168 hours. Coagulation Profile: No results for input(s): "INR", "PROTIME" in the last 168 hours. Cardiac Enzymes: No results for input(s): "CKTOTAL", "CKMB", "CKMBINDEX", "TROPONINI" in the last 168 hours. BNP (last 3  results) No results for input(s): "PROBNP" in the last 8760 hours. HbA1C: No results for input(s): "HGBA1C" in the last 72 hours. CBG: No results for input(s): "GLUCAP" in  the last 168 hours. Lipid Profile: No results for input(s): "CHOL", "HDL", "LDLCALC", "TRIG", "CHOLHDL", "LDLDIRECT" in the last 72 hours. Thyroid Function Tests: No results for input(s): "TSH", "T4TOTAL", "FREET4", "T3FREE", "THYROIDAB" in the last 72 hours. Anemia Panel: No results for input(s): "VITAMINB12", "FOLATE", "FERRITIN", "TIBC", "IRON", "RETICCTPCT" in the last 72 hours. Urine analysis:    Component Value Date/Time   COLORURINE STRAW (A) 08/14/2022 1034   APPEARANCEUR CLEAR (A) 08/14/2022 1034   APPEARANCEUR Clear 03/24/2013 2249   LABSPEC 1.017 08/14/2022 1034   LABSPEC 1.008 03/24/2013 2249   PHURINE 6.0 08/14/2022 1034   GLUCOSEU >=500 (A) 08/14/2022 1034   GLUCOSEU Negative 03/24/2013 2249   HGBUR NEGATIVE 08/14/2022 1034   BILIRUBINUR NEGATIVE 08/14/2022 1034   BILIRUBINUR Negative 03/24/2013 2249   KETONESUR 5 (A) 08/14/2022 1034   PROTEINUR NEGATIVE 08/14/2022 1034   NITRITE NEGATIVE 08/14/2022 1034   LEUKOCYTESUR NEGATIVE 08/14/2022 1034   LEUKOCYTESUR Trace 03/24/2013 2249    Radiological Exams on Admission: DG Chest 1 View  Result Date: 08/27/2022 CLINICAL DATA:  Cough, sore throat, nausea/vomiting EXAM: CHEST  1 VIEW COMPARISON:  08/06/2022 FINDINGS: Patchy left lower lobe opacity, suspicious for pneumonia. Right lung is clear. No pleural effusion or pneumothorax. The heart is normal in size. Cervical spine fixation hardware. IMPRESSION: Patchy left lower lobe opacity, suspicious for pneumonia. Electronically Signed   By: Julian Hy M.D.   On: 08/27/2022 21:45     Data Reviewed: Relevant notes from primary care and specialist visits, past discharge summaries as available in EHR, including Care Everywhere. Prior diagnostic testing as pertinent to current admission  diagnoses Updated medications and problem lists for reconciliation ED course, including vitals, labs, imaging, treatment and response to treatment Triage notes, nursing and pharmacy notes and ED provider's notes Notable results as noted in HPI   Assessment and Plan: * Severe sepsis (HCC) Severe sepsis criteria includes hypotension, tachycardia and tachypnea on arrival as well as leukocytosis with procalcitonin 1.36.  Lactic acid pending Sepsis source includes pneumonia and possible sinusitis Sepsis fluids Treat acute infection as will be outlined below  HCAP (healthcare-associated pneumonia) Acute respiratory failure with hypoxia Recently treated for COVID from 9/28 to 10/1 Likely postviral bacterial pneumonia Cefepime and vancomycin Oxygen saturation on arrival 84 on room air Pulm [place] oxygen to keep sats over 92 Antitussives, DuoNebs as needed and supportive care  Acute bacterial sinusitis Will treat with antibiotics for pneumonia as described Decongestants, saline sprays and supportive care  LADA (latent autoimmune diabetes in adults), managed as type 1 (HCC) Insulin pump Sliding scale insulin and then patient can resume insulin pump once well enough to do so  Hypothyroidism Continue levothyroxine        DVT prophylaxis: Lovenox  Consults: none  Advance Care Planning:   Code Status: Prior   Family Communication: none  Disposition Plan: Back to previous home environment  Severity of Illness: The appropriate patient status for this patient is INPATIENT. Inpatient status is judged to be reasonable and necessary in order to provide the required intensity of service to ensure the patient's safety. The patient's presenting symptoms, physical exam findings, and initial radiographic and laboratory data in the context of their chronic comorbidities is felt to place them at high risk for further clinical deterioration. Furthermore, it is not anticipated that the patient  will be medically stable for discharge from the hospital within 2 midnights of admission.   * I certify that at the point of admission it is my clinical judgment  that the patient will require inpatient hospital care spanning beyond 2 midnights from the point of admission due to high intensity of service, high risk for further deterioration and high frequency of surveillance required.*  Author: Athena Masse, MD 08/27/2022 11:57 PM  For on call review [email or web address].

## 2022-08-27 NOTE — ED Notes (Signed)
Pt O2 sat was 84% on room air during triage. Pt placed on 3L [place] and O2 sat increased to 94%.

## 2022-08-27 NOTE — ED Triage Notes (Signed)
Pt arrives with c/o n/v that started this afternoon. Pt is a type 1 diabetic. Pt started antibiotics today for a sore throat.

## 2022-08-27 NOTE — ED Notes (Signed)
Reviewed chart with Aquilla Solian, PA; no further orders at this time....will take pt to next available exam room

## 2022-08-27 NOTE — Assessment & Plan Note (Signed)
Severe sepsis criteria includes hypotension, tachycardia and tachypnea on arrival as well as leukocytosis with procalcitonin 1.36.  Lactic acid pending Sepsis source includes pneumonia and possible sinusitis Sepsis fluids Treat acute infection as will be outlined below

## 2022-08-27 NOTE — ED Provider Notes (Signed)
[facility] Provider Note    None    (approximate)   History   Emesis   HPI  Elizabeth [name] is a 81 y.o. female history on review of chart from COVID-19 infection in October 1 of this year, history of hypothyroidism type 2 diabetes chronic kidney disease and respiratory failure   She was recently in hospital with COVID-19.  Over the last 3 to 4 days she is developed a cough and the discomfort and congestion in the left lower chest.  She has been feeling increased fatigue no appetite and some nausea.  Additionally, she has been struggling since being in the hospital with a sore throat and saw [place] [facility] throat doctor today who did a scope procedure on her and told her she had an infection and started her on amoxicillin.  Also started cough medication  She reports that she has no allergy to amoxicillin and can take that in the past.  She also has taken that for dental prophylactic procedures without issue  She reports fatigue and tiredness.  She has bruising around her left eye and forehead but has not had any falls since she left the hospital.  She reports that fall occurred prior to being hospitalized with COVID-19  Both patient and husband are concerned that she has an infection and seems to be getting worse.  No abdominal pain.  Has been having some fevers low-grade at home for the last 2 days as well.  Reports the sore throat she has been experiencing has been ongoing since she was in the hospital  Physical Exam   Triage Vital Signs: ED Triage Vitals  Enc Vitals Group     BP 08/27/22 1919 (!) 87/42     Pulse Rate 08/27/22 1919 (!) 105     Resp 08/27/22 1919 (!) 23     Temp 08/27/22 1919 99.7 F (37.6 C)     Temp Source 08/27/22 1919 Oral     SpO2 08/27/22 1919 (!) 84 %     Weight 08/27/22 1915 155 lb (70.3 kg)     Height --      Head Circumference --      Peak Flow --      Pain Score 08/27/22 1915 0     Pain Loc --      Pain  Edu? --      Excl. in [place]? --     Most recent vital signs: Vitals:   08/27/22 2300 08/27/22 2312  BP: (!) 104/45   Pulse: 86   Resp: (!) 21   Temp:  98.2 F (36.8 C)  SpO2: 97%      General: Awake, no distress.  Seated in triage chair.  She appears moderately ill though, appears slightly pale in complexion and has a frequent cough.  Reports no ongoing shortness of breath, she is on oxygen.  CV:  Good peripheral perfusion.  Normal tones. Resp:  Normal effort.  Oxygen saturation normal on 2 L.  Slight crackles noted in the left lower lobe.  Occasional cough slightly productive.  Normal work of breathing though with normal rate and no accessory muscle use.  Patient reports the shortness of breath she was experiencing has improved with oxygen Abd:  No distention.  Soft nontender nondistended.  Denies pain to palpation in the abdomen and its quadrants. Other:     ED Results / Procedures / Treatments   Labs (all labs ordered are listed, but only abnormal results  are displayed) Labs Reviewed  CBC WITH DIFFERENTIAL/PLATELET - Abnormal; Notable for the following components:      Result Value   WBC 29.1 (*)    RBC 3.80 (*)    Hemoglobin 11.3 (*)    HCT 33.8 (*)    Platelets 439 (*)    Neutro Abs 27.6 (*)    Lymphs Abs 0.2 (*)    Abs Immature Granulocytes 0.16 (*)    All other components within normal limits  COMPREHENSIVE METABOLIC PANEL - Abnormal; Notable for the following components:   Sodium 128 (*)    Chloride 95 (*)    Glucose, Bld 149 (*)    BUN 25 (*)    Creatinine, Ser 1.51 (*)    Calcium 8.4 (*)    Albumin 2.8 (*)    Total Bilirubin 1.3 (*)    GFR, Estimated 34 (*)    All other components within normal limits  CULTURE, BLOOD (ROUTINE X 2)  CULTURE, BLOOD (ROUTINE X 2)  MRSA NEXT GEN BY PCR, NASAL  LACTIC ACID, PLASMA  PROCALCITONIN  PROCALCITONIN  CBG MONITORING, ED  CBG MONITORING, ED     EKG  Interpreted by me at 1945 heart rate 105 QRS 85 QTc  430 Sinus tachycardia, no evidence of acute ischemia denoted   RADIOLOGY  Chest x-ray interpreted by me as left lower lobe infiltrate  DG Chest 1 View  Result Date: 08/27/2022 CLINICAL DATA:  Cough, sore throat, nausea/vomiting EXAM: CHEST  1 VIEW COMPARISON:  08/06/2022 FINDINGS: Patchy left lower lobe opacity, suspicious for pneumonia. Right lung is clear. No pleural effusion or pneumothorax. The heart is normal in size. Cervical spine fixation hardware. IMPRESSION: Patchy left lower lobe opacity, suspicious for pneumonia. Electronically Signed   By: Julian Hy M.D.   On: 08/27/2022 21:45      PROCEDURES:  Critical Care performed: Yes, see critical care procedure note(s)  CRITICAL CARE Performed by: Delman Kitten   Total critical care time: 30 minutes  Critical care time was exclusive of separately billable procedures and treating other patients.  Critical care was necessary to treat or prevent imminent or life-threatening deterioration.  Critical care was time spent personally by me on the following activities: development of treatment plan with patient and/or surrogate as well as nursing, discussions with consultants, evaluation of patient's response to treatment, examination of patient, obtaining history from patient or surrogate, ordering and performing treatments and interventions, ordering and review of laboratory studies, ordering and review of radiographic studies, pulse oximetry and re-evaluation of patient's condition.   Procedures   MEDICATIONS ORDERED IN ED: Medications  vancomycin (VANCOREADY) IVPB 1750 mg/350 mL (has no administration in time range)  HYDROcodone-acetaminophen (NORCO/VICODIN) 5-325 MG per tablet 1 tablet (has no administration in time range)  ondansetron (ZOFRAN-ODT) disintegrating tablet 4 mg (has no administration in time range)  sodium chloride 0.9 % bolus 500 mL (0 mLs Intravenous Stopped 08/27/22 2304)  sodium chloride 0.9 % bolus 500  mL (500 mLs Intravenous New Bag/Given 08/27/22 2307)  ceFEPIme (MAXIPIME) 2 g in sodium chloride 0.9 % 100 mL IVPB (2 g Intravenous New Bag/Given 08/27/22 2307)     IMPRESSION / MDM / [place] / ED COURSE  I reviewed the triage vital signs and the nursing notes.                              Differential diagnosis includes, but is not limited to, postviral  pneumonia, acute bacterial sinusitis as diagnosed by ENT today, sepsis severe sepsis septic shock, less likely to represent cause or etiology that is representative of ACS.  Atypical chest discomfort is reported as achy pain with coughing in the left lower lung field where she also has associated crackles.  No clinical findings or symptoms that would be highly suggestive of pulmonary embolism, with her clinical picture of leukocytosis recent viral syndrome, now presenting with productive cough low-grade fevers and crackles and imaging with infiltrate in the left lower lobe I think this explains her symptomatology quite well.  Thankfully responding well to oxygen therapy and work of breathing is improved  Patient's presentation is most consistent with acute presentation with potential threat to life or bodily function.  Had pharyngoscopy at ENT clinic (per discussion and record review with Dr. Richardson Landry) and diagnosed with purulent sinus drainage on R middle meatus and started on amoxicillin.  Patient has evidence of cough, congestion crackles in the left lower base.  She also has associated hypoxia on 2 L oxygen with improvement in saturations and reassuring and normalized work of breathing noted on oxygen.  She meets criteria for sepsis, potentially severe sepsis as she has a single blood pressure less than 90.  Requested nursing who are quite constrained by resource availability at this time to repeat vital signs, patient has received 500 mL fluid bolus and I have ordered a second bolus at this time  Patient also diagnosed with  bacterial sinusitis.  I suspect the patient now is presenting with severe sepsis and postviral pneumonia.  She was hospitalized I discussed with our pharmacist, Chrys Racer, and we reviewed drug allergies with the patient as well and we will start her on broad-spectrum HCAP coverage.   Patient is not a good candidate for CT angiography to exclude pulmonary embolism at this time due to AKI and renal disease, but at this juncture I think clinical treatment for pneumonia is the most appropriate and likely cause for her symptoms   The patient is on the cardiac monitor to evaluate for evidence of arrhythmia and/or significant heart rate changes.  Normal lactic acid.  Had 1 episode of hypotension with blood pressure less than 90 on arrival but this is responded to IV fluids.  Does not technically meet criteria for severe sepsis.  Code sepsis initiated though and broad-spectrum antibiotics.  Patient does not meet criteria for volume based fluid bolusing at this time    ----------------------------------------- 10:42 PM on 08/27/2022 ----------------------------------------- Hemodynamics improved.  Heart rate and blood pressure improved.  Patient resting in bed.  Awaiting admission to hospital  Have consulted with her hospitalist, and patient has been accepted to their service by Dr. Judd Gaudier.  Patient agreeable with plan for admission.  Patient does report achiness and would like something for pain for her discomfort in her left lower chest and cough, will utilize hydrocodone.  FINAL CLINICAL IMPRESSION(S) / ED DIAGNOSES   Final diagnoses:  HCAP (healthcare-associated pneumonia)  Pleurisy  Bacterial sinusitis  AKI (acute kidney injury) ([place])  Dehydration  Acute kidney injury, Dehydration   Rx / DC Orders   ED Discharge Orders     None        Note:  This document was prepared using Dragon voice recognition software and may include unintentional dictation errors.   Delman Kitten,  MD 08/27/22 [phone]

## 2022-08-27 NOTE — H&P (Incomplete)
History and Physical    Patient: Elizabeth [name]:[id number] DOB: [birth year] DOA: 08/27/2022 DOS: the patient was seen and examined on 08/27/2022 PCP: Baxter Hire, MD  Patient coming from: Home  Chief Complaint:  Chief Complaint  Patient presents with  . Emesis    HPI: Elizabeth [name] is a 81 y.o. 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  Patient states since her discharge she has had a sore throat and increased fatigue and no appetite.  For the past 3 to 4 days she developed a cough and anterior chest pain when coughing and low-grade fevers.  She saw an ENT who did nasolaryngoscopy and diagnosed her with sinusitis and started her on amoxicillin and a cough medication.  Nausea and vomiting started a few hours prior. ED course and data review: On arrival BP 87/42, temp 99.7, pulse 105, respirations 23 and O2 sat 84% on room air.  BP was fluid responsive to 104/45 with a 1.5 L fluid bolus and O2 sats increased to the mid 90s on 3 L O2.  Labs significant for WBC 29,000 with procalcitonin 1.36 lactic acid pending.  Hemoglobin 11.3, down from baseline of 13.7 about 2 weeks prior.  Sodium 128, creatinine at baseline at 1.51. EKG, personally viewed and interpreted showing sinus tachycardia at 104 with no acute ST-T wave changes.  Chest x-ray showing patchy left lower lobe opacity suspicious for pneumonia Patient started on cefepime and vancomycin and hospitalist consulted for admission.   Review of Systems: As mentioned in the history of present illness. All other systems reviewed and are negative.  Past Medical History:   Diagnosis Date  . Anemia   . Arthritis   . Asthma    with cough/cold but nothing now  . Chronic kidney disease    due to type 1 diabetes  . Diabetes mellitus without complication ([place]) 5638   on insulin pump. VERY BRITTLE. per patient, it can drop 140 points in an hour  . GERD (gastroesophageal reflux disease)    H/O  . Heart murmur    not followed by anyone  . Hx of cervical spine surgery   . Hyperlipidemia   . Hypothyroidism   . Insulin pump in place   . PONV (postoperative nausea and vomiting)    NO PROBLEMS SINCE USING ZOFRAN INTRAOP   Past Surgical History:  Procedure Laterality Date  . ABDOMINAL HYSTERECTOMY  1972  . BLADDER SURGERY  2003   leaky bladder  . BREAST BIOPSY Left 2015   neg  . CATARACT EXTRACTION W/PHACO Right 02/21/2016   Procedure: CATARACT EXTRACTION PHACO AND INTRAOCULAR LENS PLACEMENT (IOC);  Surgeon: Leandrew Koyanagi, MD;  Location: [facility];  Service: Ophthalmology;  Laterality: Right;  Lot# [id number] HUS: 00:50.6AP%:17.0CDE: 8.56  . CATARACT EXTRACTION W/PHACO Left 03/26/2016   Procedure: CATARACT EXTRACTION PHACO AND INTRAOCULAR LENS PLACEMENT ([place]) left eye;  Surgeon: Leandrew Koyanagi, MD;  Location: [place];  Service: Ophthalmology;  Laterality: Left;  DIABETIC - insulin pump SYMFONY TORIC LENS  . CHOLECYSTECTOMY  1995  . COLONOSCOPY WITH PROPOFOL N/A 01/28/2016   Procedure: COLONOSCOPY WITH PROPOFOL;  Surgeon: Hulen Luster, MD;  Location: [facility];  Service: Gastroenterology;  Laterality: N/A;  . KNEE ARTHROPLASTY Left 02/03/2018   Procedure: COMPUTER ASSISTED TOTAL KNEE ARTHROPLASTY;  Surgeon: Dereck Leep, MD;  Location: [facility];  Service: Orthopedics;  Laterality: Left;  . KNEE ARTHROSCOPY Left 09/17/2015   Procedure: ARTHROSCOPY KNEE,partial medial menisectomy, chondroplasty medial patella femoral.;  Surgeon: Dereck Leep, MD;  Location: [facility];  Service: Orthopedics;  Laterality: Left;  . mallet toe Bilateral 2013, 2018    2nd toes  . MENISECTOMY Right    2011  . NECK SURGERY  2010   2 discs.  has plate and screws in place  . SEPTOPLASTY  1997   Social History:  reports that she has never smoked. She has never used smokeless tobacco. She reports that she does not drink alcohol and does not use drugs.  Allergies  Allergen Reactions  . Neosporin [Neomycin-Bacitracin Zn-Polymyx] Other (See Comments)    Skin rash or redness  . Ceclor [Cefaclor] Other (See Comments)    Unsure of reaction type  . Other     Other reaction(s): Other (See Comments) sulfasaline-Does not remember  . Polysorbate Other (See Comments)  . Relafen [Nabumetone] Other (See Comments)    Unsure of reaction type  . Sulfasalazine Other (See Comments)    Unsure of reaction type  . Amoxicillin Nausea And Vomiting    Has patient had a PCN reaction causing immediate rash, facial/tongue/throat swelling, SOB or lightheadedness with hypotension: No Has patient had a PCN reaction causing severe rash involving mucus membranes or skin necrosis:No Has patient had a PCN reaction that required hospitalization: No Has patient had a PCN reaction occurring within the last 10 years: No If all of the above answers are "NO", then may proceed with Cephalosporin use.   . Band-Aid Plus Antibiotic [Bacitracin-Polymyxin B] Other (See Comments)    ADHESIVE, NOT ANTIBIOTIC Can use paper tape PULLS SKIN OFF Other reaction(s): Other (See Comments) PULLS SKIN OFF PULLS SKIN OFF  . Clindamycin Rash  . Niacin     Other reaction(s): Other (See Comments) flushing  . Niaspan [Niacin Er] Other (See Comments)    FLUSHING  . Penicillin V Potassium Other (See Comments)    Has patient had a PCN reaction causing immediate rash, facial/tongue/throat swelling, SOB or lightheadedness with hypotension: Unknown Has patient had a PCN reaction causing severe rash involving mucus membranes or skin necrosis: Unknown Has patient had a PCN reaction that required  hospitalization: No Has patient had a PCN reaction occurring within the last 10 years: No If all of the above answers are "NO", then may proceed with Cephalosporin use.   Marland Kitchen Penicillins Nausea And Vomiting    Happened so long ago and patient is not sure of reaction  . Statins Other (See Comments)    Other reaction(s): Other (See Comments) arthralgia ARTHRALGIA arthralgia    Family History  Problem Relation Age of Onset  . Breast cancer Maternal Aunt 78  . Breast cancer Paternal Aunt 78    Prior to Admission medications   Medication Sig Start Date End Date Taking? Authorizing Provider  Biotin 10000 MCG TABS Take 10,000 mcg by mouth daily at 12 noon.    [provider]  Calcium Carb-Cholecalciferol (CALCIUM 600+D3 PO) Take 2 tablets by mouth daily.    [provider]  glucagon 1 MG injection Inject 1 mg into the muscle daily as needed (for low blood sugars). Reported on 02/21/2016    [provider]  glucose 5 g chewable tablet Chew 15  g by mouth 3 (three) times daily as needed for low blood sugar.     [provider]  Insulin Human (INSULIN PUMP) SOLN Inject into the skin. NOVOLOG 100 UNIT/ML injection    [provider]  levothyroxine (SYNTHROID) 175 MCG tablet Take 87.5 mcg by mouth every morning. 05/28/22   [provider]  levothyroxine (SYNTHROID, LEVOTHROID) 100 MCG tablet Take 100 mcg by mouth daily before breakfast.  Patient not taking: Reported on 08/14/2022 04/12/14   [provider]  lidocaine (XYLOCAINE) 2 % solution Use as directed 5 mLs in the mouth or throat 4 (four) times daily as needed for pain. 08/21/22   [provider]  Melatonin 10 MG TABS Take 10 mg by mouth at bedtime.    [provider]  NOVOLOG 100 UNIT/ML injection SMARTSIG:0-60 Unit(s) SUB-Q Daily 05/26/22   [provider]  pseudoephedrine-guaifenesin (MUCINEX D) 60-600 MG 12 hr tablet Take 1 tablet by mouth 2 (two) times  daily as needed for congestion.    [provider]  rosuvastatin (CRESTOR) 10 MG tablet Take 10 mg by mouth at bedtime.  04/12/14   [provider]  vitamin B-12 (CYANOCOBALAMIN) 1000 MCG tablet Take 1,000 mcg by mouth daily at 12 noon.    [provider]    Physical Exam: Vitals:   08/27/22 1934 08/27/22 2239 08/27/22 2300 08/27/22 2312  BP:  (!) 107/52 (!) 104/45   Pulse:  90 86   Resp:  19 (!) 21   Temp:    98.2 F (36.8 C)  TempSrc:    Oral  SpO2: 94% 96% 97%   Weight:       Physical Exam  Labs on Admission: I have personally reviewed following labs and imaging studies  CBC: Recent Labs  Lab 08/27/22 1921  WBC 29.1*  NEUTROABS 27.6*  HGB 11.3*  HCT 33.8*  MCV 88.9  PLT 250*   Basic Metabolic Panel: Recent Labs  Lab 08/27/22 1921  NA 128*  K 4.2  CL 95*  CO2 22  GLUCOSE 149*  BUN 25*  CREATININE 1.51*  CALCIUM 8.4*   GFR: Estimated Creatinine Clearance: 27.6 mL/min (A) (by C-G formula based on SCr of 1.51 mg/dL (H)). Liver Function Tests: Recent Labs  Lab 08/27/22 1921  AST 28  ALT 16  ALKPHOS 112  BILITOT 1.3*  PROT 7.3  ALBUMIN 2.8*   No results for input(s): "LIPASE", "AMYLASE" in the last 168 hours. No results for input(s): "AMMONIA" in the last 168 hours. Coagulation Profile: No results for input(s): "INR", "PROTIME" in the last 168 hours. Cardiac Enzymes: No results for input(s): "CKTOTAL", "CKMB", "CKMBINDEX", "TROPONINI" in the last 168 hours. BNP (last 3 results) No results for input(s): "PROBNP" in the last 8760 hours. HbA1C: No results for input(s): "HGBA1C" in the last 72 hours. CBG: No results for input(s): "GLUCAP" in the last 168 hours. Lipid Profile: No results for input(s): "CHOL", "HDL", "LDLCALC", "TRIG", "CHOLHDL", "LDLDIRECT" in the last 72 hours. Thyroid Function Tests: No results for input(s): "TSH", "T4TOTAL", "FREET4", "T3FREE", "THYROIDAB" in the last 72 hours. Anemia Panel: No results  for input(s): "VITAMINB12", "FOLATE", "FERRITIN", "TIBC", "IRON", "RETICCTPCT" in the last 72 hours. Urine analysis:    Component Value Date/Time   COLORURINE STRAW (A) 08/14/2022 1034   APPEARANCEUR CLEAR (A) 08/14/2022 1034   APPEARANCEUR Clear 03/24/2013 2249   LABSPEC 1.017 08/14/2022 1034   LABSPEC 1.008 03/24/2013 2249   PHURINE 6.0 08/14/2022 1034   GLUCOSEU >=500 (A) 08/14/2022  [place] Negative 03/24/2013 2249   HGBUR NEGATIVE 08/14/2022 1034   BILIRUBINUR NEGATIVE 08/14/2022 1034   BILIRUBINUR Negative 03/24/2013 2249   KETONESUR 5 (A) 08/14/2022 1034   PROTEINUR NEGATIVE 08/14/2022 1034   NITRITE NEGATIVE 08/14/2022 1034   LEUKOCYTESUR NEGATIVE 08/14/2022 1034   LEUKOCYTESUR Trace 03/24/2013 2249    Radiological Exams on Admission: DG Chest 1 View  Result Date: 08/27/2022 CLINICAL DATA:  Cough, sore throat, nausea/vomiting EXAM: CHEST  1 VIEW COMPARISON:  08/06/2022 FINDINGS: Patchy left lower lobe opacity, suspicious for pneumonia. Right lung is clear. No pleural effusion or pneumothorax. The heart is normal in size. Cervical spine fixation hardware. IMPRESSION: Patchy left lower lobe opacity, suspicious for pneumonia. Electronically Signed   By: Julian Hy M.D.   On: 08/27/2022 21:45     Data Reviewed: Relevant notes from primary care and specialist visits, past discharge summaries as available in EHR, including Care Everywhere. Prior diagnostic testing as pertinent to current admission diagnoses Updated medications and problem lists for reconciliation ED course, including vitals, labs, imaging, treatment and response to treatment Triage notes, nursing and pharmacy notes and ED provider's notes Notable results as noted in HPI   Assessment and Plan: * Severe sepsis (HCC) Severe sepsis criteria includes hypotension, tachycardia and tachypnea on arrival as well as leukocytosis with procalcitonin 1.36.  Lactic acid pending Sepsis source includes  pneumonia and possible sinusitis Sepsis fluids Treat acute infection as will be outlined below  HCAP (healthcare-associated pneumonia) Acute respiratory failure with hypoxia Recently treated for COVID from 9/28 to 10/1 Likely postviral bacterial pneumonia Cefepime and vancomycin Oxygen saturation on arrival 84 on room air Pulm [place] oxygen to keep sats over 92 Antitussives, DuoNebs as needed and supportive care  Acute bacterial sinusitis Will treat with antibiotics for pneumonia as described Decongestants, saline sprays and supportive care  LADA (latent autoimmune diabetes in adults), managed as type 1 (HCC) Insulin pump Sliding scale insulin and then patient can resume insulin pump once well enough to do so  Hypothyroidism Continue levothyroxine        DVT prophylaxis: Lovenox  Consults: none  Advance Care Planning:   Code Status: Prior   Family Communication: none  Disposition Plan: Back to previous home environment  Severity of Illness: The appropriate patient status for this patient is INPATIENT. Inpatient status is judged to be reasonable and necessary in order to provide the required intensity of service to ensure the patient's safety. The patient's presenting symptoms, physical exam findings, and initial radiographic and laboratory data in the context of their chronic comorbidities is felt to place them at high risk for further clinical deterioration. Furthermore, it is not anticipated that the patient will be medically stable for discharge from the hospital within 2 midnights of admission.   * I certify that at the point of admission it is my clinical judgment that the patient will require inpatient hospital care spanning beyond 2 midnights from the point of admission due to high intensity of service, high risk for further deterioration and high frequency of surveillance required.*  Author: Athena Masse, MD 08/27/2022 11:57 PM  For on call review  [email or web address].

## 2022-08-27 NOTE — Progress Notes (Signed)
PHARMACY -  BRIEF ANTIBIOTIC NOTE   Pharmacy has received consult(s) for Vancomycin, Cefepime from an ED provider.  The patient's profile has been reviewed for ht/wt/allergies/indication/available labs.    One time order(s) placed for Cefepime 2 gm IV X 1 and Vancomycin 1750 mg IV X 1.   Further antibiotics/pharmacy consults should be ordered by admitting physician if indicated.                       Thank you, Korrey Schleicher D 08/27/2022  10:17 PM

## 2022-08-27 NOTE — ED Provider Triage Note (Signed)
Emergency Medicine Provider Triage Evaluation Note  Elizabeth [name] , a 81 y.o. female  was evaluated in triage.  Patient has a history of hypothyroidism, GERD, hyperlipidemia, diabetes and chronic kidney disease, presents to the emergency department with nausea and vomiting that started today.  Patient recently had COVID-19 1 week ago.  She denies specific abdominal pain, chest pain, chest tightness or shortness of breath.  Review of Systems  Positive: Patient has nausea and vomiting.  Negative: No chest pain or abdominal pain.   Physical Exam  Wt 70.3 kg   BMI 26.61 kg/m  Gen:   Patient is subdued and appears fatigued. Resp:  Normal effort  MSK:   Moves extremities without difficulty  Other:    Medical Decision Making  Medically screening exam initiated at 7:18 PM.  Appropriate orders placed.  Davonna Belling was informed that the remainder of the evaluation will be completed by another provider, this initial triage assessment does not replace that evaluation, and the importance of remaining in the ED until their evaluation is complete.     Vallarie Mare [place], [place] 08/27/22 1920

## 2022-08-27 NOTE — Sepsis Progress Note (Signed)
Following for sepsis monitoring ?

## 2022-08-27 NOTE — Assessment & Plan Note (Signed)
Will treat with antibiotics for pneumonia as described Decongestants, saline sprays and supportive care

## 2022-08-28 DIAGNOSIS — J9601 Acute respiratory failure with hypoxia: Secondary | ICD-10-CM

## 2022-08-28 DIAGNOSIS — A419 Sepsis, unspecified organism: Secondary | ICD-10-CM | POA: Diagnosis not present

## 2022-08-28 DIAGNOSIS — J019 Acute sinusitis, unspecified: Secondary | ICD-10-CM

## 2022-08-28 DIAGNOSIS — N179 Acute kidney failure, unspecified: Secondary | ICD-10-CM | POA: Diagnosis not present

## 2022-08-28 DIAGNOSIS — J329 Chronic sinusitis, unspecified: Secondary | ICD-10-CM

## 2022-08-28 DIAGNOSIS — B9689 Other specified bacterial agents as the cause of diseases classified elsewhere: Secondary | ICD-10-CM

## 2022-08-28 LAB — CBC
HCT: 28.5 % — ABNORMAL LOW (ref 36.0–46.0)
Hemoglobin: 9.3 g/dL — ABNORMAL LOW (ref 12.0–15.0)
MCH: 29.5 pg (ref 26.0–34.0)
MCHC: 32.6 g/dL (ref 30.0–36.0)
MCV: 90.5 fL (ref 80.0–100.0)
Platelets: 384 10*3/uL (ref 150–400)
RBC: 3.15 MIL/uL — ABNORMAL LOW (ref 3.87–5.11)
RDW: 14.3 % (ref 11.5–15.5)
WBC: 23.4 10*3/uL — ABNORMAL HIGH (ref 4.0–10.5)
nRBC: 0 % (ref 0.0–0.2)

## 2022-08-28 LAB — BASIC METABOLIC PANEL
Anion gap: 11 (ref 5–15)
BUN: 27 mg/dL — ABNORMAL HIGH (ref 8–23)
CO2: 20 mmol/L — ABNORMAL LOW (ref 22–32)
Calcium: 7.2 mg/dL — ABNORMAL LOW (ref 8.9–10.3)
Chloride: 102 mmol/L (ref 98–111)
Creatinine, Ser: 1.18 mg/dL — ABNORMAL HIGH (ref 0.44–1.00)
GFR, Estimated: 46 mL/min — ABNORMAL LOW (ref >60)
Glucose, Bld: 137 mg/dL — ABNORMAL HIGH (ref 70–99)
Potassium: 4.4 mmol/L (ref 3.5–5.1)
Sodium: 133 mmol/L — ABNORMAL LOW (ref 135–145)

## 2022-08-28 LAB — PROCALCITONIN: Procalcitonin: 0.89 ng/mL

## 2022-08-28 LAB — MRSA NEXT GEN BY PCR, NASAL: MRSA by PCR Next Gen: NOT DETECTED

## 2022-08-28 MED ORDER — ONDANSETRON HCL 4 MG PO TABS
4.0000 mg | ORAL_TABLET | Freq: Four times a day (QID) | ORAL | Status: DC | PRN
  Administered 2022-08-30: 4 mg via ORAL
  Filled 2022-08-28: qty 1

## 2022-08-28 MED ORDER — VANCOMYCIN HCL 750 MG/150ML IV SOLN: 750.0000 mg | INTRAVENOUS | Status: DC

## 2022-08-28 MED ORDER — LEVOTHYROXINE SODIUM 50 MCG PO TABS
87.5000 ug | ORAL_TABLET | Freq: Every day | ORAL | Status: DC
  Administered 2022-08-28 – 2022-08-30 (×3): 87.5 ug via ORAL
  Filled 2022-08-28 (×2): qty 2
  Filled 2022-08-28 (×2): qty 0.5

## 2022-08-28 MED ORDER — SODIUM CHLORIDE 0.9 % IV SOLN: 2.0000 g | INTRAVENOUS | Status: DC

## 2022-08-28 MED ORDER — HYDROCODONE-ACETAMINOPHEN 5-325 MG PO TABS
1.0000 | ORAL_TABLET | ORAL | Status: DC | PRN
  Administered 2022-08-28 (×2): 1 via ORAL
  Filled 2022-08-28 (×2): qty 1

## 2022-08-28 MED ORDER — ENOXAPARIN SODIUM 30 MG/0.3ML IJ SOSY
30.0000 mg | PREFILLED_SYRINGE | INTRAMUSCULAR | Status: DC
  Administered 2022-08-28: 30 mg via SUBCUTANEOUS
  Filled 2022-08-28: qty 0.3

## 2022-08-28 MED ORDER — ENOXAPARIN SODIUM 40 MG/0.4ML IJ SOSY
40.0000 mg | PREFILLED_SYRINGE | Freq: Every day | INTRAMUSCULAR | Status: DC
  Administered 2022-08-28 – 2022-08-29 (×2): 40 mg via SUBCUTANEOUS
  Filled 2022-08-28 (×2): qty 0.4

## 2022-08-28 MED ORDER — ACETAMINOPHEN 325 MG PO TABS: 650.0000 mg | ORAL_TABLET | Freq: Four times a day (QID) | ORAL | Status: DC | PRN

## 2022-08-28 MED ORDER — ONDANSETRON HCL 4 MG/2ML IJ SOLN
4.0000 mg | Freq: Four times a day (QID) | INTRAMUSCULAR | Status: DC | PRN
  Administered 2022-08-29: 4 mg via INTRAVENOUS
  Filled 2022-08-28 (×2): qty 2

## 2022-08-28 MED ORDER — ACETAMINOPHEN 650 MG RE SUPP: 650.0000 mg | Freq: Four times a day (QID) | RECTAL | Status: DC | PRN

## 2022-08-28 MED ORDER — INSULIN ASPART 100 UNIT/ML IJ SOLN: 0.0000 [IU] | INTRAMUSCULAR | Status: DC

## 2022-08-28 MED ORDER — INSULIN PUMP
Freq: Three times a day (TID) | SUBCUTANEOUS | Status: DC
  Administered 2022-08-29: 2 via SUBCUTANEOUS
  Filled 2022-08-28: qty 1

## 2022-08-28 MED ORDER — SODIUM CHLORIDE 0.9 % IV SOLN
2.0000 g | Freq: Two times a day (BID) | INTRAVENOUS | Status: DC
  Administered 2022-08-28 – 2022-08-30 (×5): 2 g via INTRAVENOUS
  Filled 2022-08-28: qty 12.5
  Filled 2022-08-28: qty 2
  Filled 2022-08-28 (×2): qty 12.5
  Filled 2022-08-28: qty 2
  Filled 2022-08-28: qty 12.5

## 2022-08-28 MED ORDER — MELATONIN 5 MG PO TABS
10.0000 mg | ORAL_TABLET | Freq: Every day | ORAL | Status: DC
  Administered 2022-08-28 – 2022-08-29 (×2): 10 mg via ORAL
  Filled 2022-08-28 (×3): qty 2

## 2022-08-28 MED ORDER — SODIUM CHLORIDE 0.9 % IV BOLUS
1000.0000 mL | Freq: Once | INTRAVENOUS | Status: AC
  Administered 2022-08-28: 1000 mL via INTRAVENOUS

## 2022-08-28 MED ORDER — ALBUTEROL SULFATE (2.5 MG/3ML) 0.083% IN NEBU: 2.5000 mg | INHALATION_SOLUTION | RESPIRATORY_TRACT | Status: DC | PRN

## 2022-08-28 MED ORDER — PSEUDOEPHEDRINE-GUAIFENESIN ER 60-600 MG PO TB12: 1.0000 | ORAL_TABLET | Freq: Two times a day (BID) | ORAL | Status: DC | PRN

## 2022-08-28 MED ORDER — SODIUM CHLORIDE 0.9 % IV SOLN: INTRAVENOUS | Status: AC

## 2022-08-28 MED ORDER — GUAIFENESIN-DM 100-10 MG/5ML PO SYRP
5.0000 mL | ORAL_SOLUTION | ORAL | Status: DC | PRN
  Administered 2022-08-28 – 2022-08-29 (×3): 5 mL via ORAL
  Filled 2022-08-28 (×3): qty 10

## 2022-08-28 NOTE — ED Notes (Signed)
Patient resting in bed free from sign of distress. Breathing unlabored speaking in full sentences with symmetric chest rise and fall. Bed low and locked with side rails raised x2. Call bell in reach and monitor in place.   

## 2022-08-28 NOTE — Assessment & Plan Note (Signed)
Acute respiratory failure with hypoxia Recently treated for COVID from 9/28 to 10/1 Likely postviral bacterial pneumonia Cefepime and vancomycin Oxygen saturation on arrival 84 on room air Pulm Antle oxygen to keep sats over 92 Antitussives, DuoNebs as needed and supportive care

## 2022-08-28 NOTE — ED Notes (Signed)
Patient set up with breakfast tray. Tv turned on. Call bell within reach. AP, RN

## 2022-08-28 NOTE — ED Notes (Signed)
5.56 units of insulin instilled by pump

## 2022-08-28 NOTE — Progress Notes (Signed)
Pharmacy Antibiotic Note  Elizabeth [name] is a 81 y.o. female admitted on 08/27/2022 with sepsis.  Pharmacy has been consulted for Vanc, Cefepime dosing.  Plan: Cefepime 2 gm IV X 1 given in ED on 10/11 @ 2300. Cefepime 2 gm IV Q24H ordered to start on 10/12 @ 2300.   Vancomycin 1750 mg IV X 1 given in ED on 10/12 @ 0052. Vancomycin 750 mg IV Q48H ordered to start on 10/14 @ 0100.  AUC = 427.0 Vanc trough = 9.4   Weight: 70.3 kg (155 lb)  Temp (24hrs), Avg:99 F (37.2 C), Min:98.2 F (36.8 C), Max:99.7 F (37.6 C)  Recent Labs  Lab 08/27/22 1921  WBC 29.1*  CREATININE 1.51*  LATICACIDVEN 1.4    Estimated Creatinine Clearance: 27.6 mL/min (A) (by C-G formula based on SCr of 1.51 mg/dL (H)).    Allergies  Allergen Reactions   Neosporin [Neomycin-Bacitracin Zn-Polymyx] Other (See Comments)    Skin rash or redness   Ceclor [Cefaclor] Other (See Comments)    Unsure of reaction type   Other     Other reaction(s): Other (See Comments) sulfasaline-Does not remember   Polysorbate Other (See Comments)   Relafen [Nabumetone] Other (See Comments)    Unsure of reaction type   Sulfasalazine Other (See Comments)    Unsure of reaction type   Amoxicillin Nausea And Vomiting    Has patient had a PCN reaction causing immediate rash, facial/tongue/throat swelling, SOB or lightheadedness with hypotension: No Has patient had a PCN reaction causing severe rash involving mucus membranes or skin necrosis:No Has patient had a PCN reaction that required hospitalization: No Has patient had a PCN reaction occurring within the last 10 years: No If all of the above answers are "NO", then may proceed with Cephalosporin use.    Band-Aid Plus Antibiotic [Bacitracin-Polymyxin B] Other (See Comments)    ADHESIVE, NOT ANTIBIOTIC Can use paper tape PULLS SKIN OFF Other reaction(s): Other (See Comments) PULLS SKIN OFF PULLS SKIN OFF   Clindamycin Rash   Niacin     Other reaction(s): Other (See  Comments) flushing   Niaspan [Niacin Er] Other (See Comments)    FLUSHING   Penicillin V Potassium Other (See Comments)    Has patient had a PCN reaction causing immediate rash, facial/tongue/throat swelling, SOB or lightheadedness with hypotension: Unknown Has patient had a PCN reaction causing severe rash involving mucus membranes or skin necrosis: Unknown Has patient had a PCN reaction that required hospitalization: No Has patient had a PCN reaction occurring within the last 10 years: No If all of the above answers are "NO", then may proceed with Cephalosporin use.    Penicillins Nausea And Vomiting    Happened so long ago and patient is not sure of reaction   Statins Other (See Comments)    Other reaction(s): Other (See Comments) arthralgia ARTHRALGIA arthralgia    Antimicrobials this admission:   >>    >>   Dose adjustments this admission:   Microbiology results:  BCx:   UCx:    Sputum:    MRSA PCR:   Thank you for allowing pharmacy to be a part of this patient's care.  Orlan Aversa D 08/28/2022 2:27 AM

## 2022-08-28 NOTE — Progress Notes (Signed)
[facility]  PROGRESS NOTE  LOREEN BANKSON IRJ:[id number] DOB: Mar 24, 1941 DOA: 08/27/2022 PCP: Baxter Hire, MD   Brief HPI:   81 year old female with medical history of hypothyroidism, osteoarthritis, diabetes mellitus type 1 on insulin pump, CKD stage IIIb recently hospitalized in the ICU from 08/14/2022 to 08/17/2022 with flulike symptoms, COVID-positive with respiratory failure as well as HH NK and AKI and circulatory shock requiring pressors. Came to ED with nausea vomiting.  For past 2 to 4 days she developed cough with anterior chest pain.  She was seen by ENT had nasolaryngoscopy and found to have sinusitis.  Patient started on amoxicillin and cough medicine.  In the ED blood pressure was 87 x 42, pulse 105, respiration 23, O2 sats 84% on room air. Blood pressure improved after 1.5 L of fluid bolus.  WBC 29,000, procalcitonin 1.36.  Lactic acid 1.4.  Chest x-ray showed patchy left lower lobe opacity suspicious for pneumonia. Patient was started on cefepime and vancomycin.    Subjective   Patient seen and examined, feels better this morning.  Denies shortness of breath.   Assessment/Plan:    Severe sepsis -Presented with hypotension, tachycardia, tachypnea with hypoxemia, procalcitonin 1.36 -Source likely sinusitis versus pneumonia -Patient started on vancomycin and cefepime -Procalcitonin has improved to 0.89, lactic acid 1.4 -WBC is down to 23,000  Community-acquired pneumonia/acute hypoxemic respiratory failure -Likely postviral bacterial pneumonia from COVID 19 infection -Started on antibiotics as above -Continue oxygen, keep O2 sats more than 92% -DuoNebs as needed  Acute bacterial sinusitis -Diagnosed by ENT provider recently -Started on antibiotics as above  LADA, 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 intake  Medications     enoxaparin (LOVENOX) injection  30 mg Subcutaneous Q24H   insulin pump   Subcutaneous TID WC, HS, 0200   levothyroxine  87.5 mcg Oral Q0600   melatonin  10 mg Oral QHS     Data Reviewed:   CBG:  No results for input(s): "GLUCAP" in the last 168 hours.  SpO2: 96 % O2 Flow Rate (L/min): 3 L/min    Vitals:   08/28/22 0430 08/28/22 0500 08/28/22 0600 08/28/22 0647  BP: (!) 105/48 (!) 103/44 (!) 97/47   Pulse: 79 78 76   Resp: '18 19 17   '$ Temp:    98.2 F (36.8 C)  TempSrc:    Oral  SpO2: 97% 96% 96%   Weight:          Data Reviewed:  Basic Metabolic Panel: Recent Labs  Lab 08/27/22 1921 08/28/22 0414  NA 128* 133*  K 4.2 4.4  CL 95* 102  CO2 22 20*  GLUCOSE 149* 137*  BUN 25* 27*  CREATININE 1.51* 1.18*  CALCIUM 8.4* 7.2*    CBC: Recent Labs  Lab 08/27/22 1921 08/28/22 0414  WBC 29.1* 23.4*  NEUTROABS 27.6*  --   HGB 11.3* 9.3*  HCT 33.8* 28.5*  MCV 88.9 90.5  PLT 439* 384    LFT Recent Labs  Lab 08/27/22 1921  AST 28  ALT 16  ALKPHOS 112  BILITOT 1.3*  PROT 7.3  ALBUMIN 2.8*     Antibiotics: Anti-infectives (From admission, onward)    Start     Dose/Rate Route Frequency Ordered Stop  08/30/22 0100  vancomycin (VANCOREADY) IVPB 750 mg/150 mL  Status:  Discontinued        750 mg 150 mL/hr over 60 Minutes Intravenous Every 48 hours 08/28/22 0226 08/28/22 0233   08/30/22 0100  vancomycin (VANCOREADY) IVPB 750 mg/150 mL        750 mg 150 mL/hr over 60 Minutes Intravenous Every 48 hours 08/28/22 0233 09/05/22 0059   08/28/22 2300  ceFEPIme (MAXIPIME) 2 g in sodium chloride 0.9 % 100 mL IVPB  Status:  Discontinued        2 g 200 mL/hr over 30 Minutes Intravenous Every 24 hours 08/28/22 0215 08/28/22 0231   08/28/22 2300  ceFEPIme (MAXIPIME) 2 g in sodium  chloride 0.9 % 100 mL IVPB  Status:  Discontinued        2 g 200 mL/hr over 30 Minutes Intravenous Every 24 hours 08/28/22 0231 08/28/22 0808   08/28/22 1000  ceFEPIme (MAXIPIME) 2 g in sodium chloride 0.9 % 100 mL IVPB        2 g 200 mL/hr over 30 Minutes Intravenous Every 12 hours 08/28/22 0808 09/03/22 0959   08/27/22 2230  ceFEPIme (MAXIPIME) 2 g in sodium chloride 0.9 % 100 mL IVPB        2 g 200 mL/hr over 30 Minutes Intravenous  Once 08/27/22 2216 08/27/22 2355   08/27/22 2230  vancomycin (VANCOREADY) IVPB 1750 mg/350 mL        1,750 mg 175 mL/hr over 120 Minutes Intravenous  Once 08/27/22 2216 08/28/22 0253        DVT prophylaxis: Lovenox  Code Status: Full code  Family Communication: No family at bedside   CONSULTS    Objective    Physical Examination:   General-appears in no acute distress Heart-S1-S2, regular, no murmur auscultated Lungs-clear to auscultation bilaterally, no wheezing or crackles auscultated Abdomen-soft, nontender, no organomegaly Extremities-no edema in the lower extremities Neuro-alert, oriented x3, no focal deficit noted  Status is: Inpatient:             Oswald Hillock   [facility] If 7PM-7AM, please contact night-coverage at [email or web address], Office  [phone]   08/28/2022, 8:12 AM  LOS: 1 day

## 2022-08-28 NOTE — Progress Notes (Signed)
Anticoagulation monitoring(Lovenox):  81 yo female ordered Lovenox 40 mg Q24h    Filed Weights   08/27/22 1915  Weight: 70.3 kg (155 lb)   BMI 26.6   Lab Results  Component Value Date   CREATININE 1.51 (H) 08/27/2022   CREATININE 1.07 (H) 08/17/2022   CREATININE 1.16 (H) 08/16/2022   Estimated Creatinine Clearance: 27.6 mL/min (A) (by C-G formula based on SCr of 1.51 mg/dL (H)). Hemoglobin & Hematocrit     Component Value Date/Time   HGB 11.3 (L) 08/27/2022 1921   HGB 13.3 03/24/2013 2201   HCT 33.8 (L) 08/27/2022 1921   HCT 39.1 03/24/2013 2201     Per Protocol for Patient with estCrcl < 30 ml/min and BMI < 40, will transition to Lovenox 30 mg Q24h.

## 2022-08-28 NOTE — ED Notes (Signed)
Patient up to restroom with NAD noted

## 2022-08-28 NOTE — ED Notes (Signed)
Stand by assist pt to toilet and oral care.

## 2022-08-28 NOTE — ED Notes (Signed)
Pt has insulin monitor and pump in place. RN requested pt turn off and remove and pt reluctant to do so. States she'd prefer to keep it in place rather than rely on hospital insulin. Hospitalist messaged by RN to see if this is ok with her. RN awaiting response.

## 2022-08-28 NOTE — ED Notes (Signed)
Pt reports glucose monitor reporting 150.

## 2022-08-28 NOTE — ED Notes (Signed)
MD Damita Dunnings states pt ok to utilize home insulin monitor and pump that is in place. MD placing order to reflect this. RN educated pt to ensure that they notify all staff of this in order to promote pt safety. Pt verbalizes understanding.

## 2022-08-28 NOTE — Assessment & Plan Note (Addendum)
Insulin pump Sliding scale insulin and then patient can resume insulin pump once well enough to do so

## 2022-08-28 NOTE — Inpatient Diabetes Management (Signed)
Inpatient Diabetes Program Recommendations  AACE/ADA: New Consensus Statement on Inpatient Glycemic Control   Target Ranges:  Prepandial:   less than 140 mg/dL      Peak postprandial:   less than 180 mg/dL (1-2 hours)      Critically ill patients:  140 - 180 mg/dL    Latest Reference Range & Units 08/27/22 19:21 08/28/22 04:14  Glucose 70 - 99 mg/dL 149 (H) 137 (H)   Review of Glycemic Control  Diabetes history: DM1 (requires basal, correction, and carbohydrate coverage insulin) Outpatient Diabetes medications: T-Slim insulin pump with Novolog Current orders for Inpatient glycemic control: Insulin Pump AC&HS and 2 am  Inpatient Diabetes Program Recommendations:    Insulin Pump: CBG trending well on insulin pump.   NOTE: Patient admitted with sepsis, HCAP, and has DM1 and uses an insulin pump for DM control. Patient was recently inpatient 08/14/22-08/17/22 and was seen by inpatient diabetes coordinator on 08/15/22. Patient sees Dr. Honor Junes (Endocrinologist) and was last seen 03/27/22; per office note insulin pump settings should be: Basal rates Midnight = 0.45 6 AM = 0.625 9 AM = 0.625 2 PM = 0.625 6 PM = 0.45 TDD basal: 12.9 units  Bolus settings I/C: 9 at midnight, 12 at 2 PM ISF: 52 at midnight, 55 at 9 AM Target Glucose: 110 at midnight, 130 at 6 PM Active insulin time: 4 hours.   NURSING: Once insulin pump order set is ordered please print off the Patient insulin pump contract and flow sheet. The insulin pump contract should be signed by the patient and then placed in the chart. The patient insulin pump flow sheet will be completed by the patient at the bedside and the RN caring for the patient will use the patient's flow sheet to document in the [facility]. RN will need to complete the Nursing Insulin Pump Flowsheet at least once a shift. Patient will need to keep extra insulin pump supplies at the bedside at all times.   Thanks, Barnie Alderman, RN, MSN, [place] [facility]  Coordinator Inpatient Diabetes Program [phone] (Team Pager from 8am to [place])

## 2022-08-28 NOTE — ED Notes (Addendum)
Pt requested to utilize bathroom. Pt ambulated to and from toilet in room independently. Pt then requested to sit in recliner vs bed. Pt assisted with positioning in recliner for comfort and is now sitting in chair free from sign of distress. Breathing unlabored and speaking in full sentences. Bed low and locked. Side rails raised x2. Monitor in place and call bell in reach.

## 2022-08-28 NOTE — Assessment & Plan Note (Signed)
Continue levothyroxine 

## 2022-08-29 ENCOUNTER — Encounter: Payer: Self-pay | Admitting: Internal Medicine

## 2022-08-29 LAB — CBC
HCT: 29.7 % — ABNORMAL LOW (ref 36.0–46.0)
Hemoglobin: 9.8 g/dL — ABNORMAL LOW (ref 12.0–15.0)
MCH: 29.2 pg (ref 26.0–34.0)
MCHC: 33 g/dL (ref 30.0–36.0)
MCV: 88.4 fL (ref 80.0–100.0)
Platelets: 429 10*3/uL — ABNORMAL HIGH (ref 150–400)
RBC: 3.36 MIL/uL — ABNORMAL LOW (ref 3.87–5.11)
RDW: 14.4 % (ref 11.5–15.5)
WBC: 11.8 10*3/uL — ABNORMAL HIGH (ref 4.0–10.5)
nRBC: 0 % (ref 0.0–0.2)

## 2022-08-29 LAB — GLUCOSE, CAPILLARY
Glucose-Capillary: 124 mg/dL — ABNORMAL HIGH (ref 70–99)
Glucose-Capillary: 137 mg/dL — ABNORMAL HIGH (ref 70–99)
Glucose-Capillary: 123 mg/dL — ABNORMAL HIGH (ref 70–99)
Glucose-Capillary: 148 mg/dL — ABNORMAL HIGH (ref 70–99)
Glucose-Capillary: 158 mg/dL — ABNORMAL HIGH (ref 70–99)

## 2022-08-29 LAB — BASIC METABOLIC PANEL
Anion gap: 7 (ref 5–15)
BUN: 23 mg/dL (ref 8–23)
CO2: 22 mmol/L (ref 22–32)
Calcium: 8 mg/dL — ABNORMAL LOW (ref 8.9–10.3)
Chloride: 107 mmol/L (ref 98–111)
Creatinine, Ser: 0.95 mg/dL (ref 0.44–1.00)
GFR, Estimated: 60 mL/min — ABNORMAL LOW (ref >60)
Glucose, Bld: 145 mg/dL — ABNORMAL HIGH (ref 70–99)
Potassium: 4.4 mmol/L (ref 3.5–5.1)
Sodium: 136 mmol/L (ref 135–145)

## 2022-08-29 LAB — PROCALCITONIN: Procalcitonin: 0.6 ng/mL

## 2022-08-29 NOTE — Care Management Important Message (Signed)
Important Message  Patient Details  Name: Elizabeth [name] MRN: [id number] Date of Birth: Dec 13, 1940   Medicare Important Message Given:  N/A - LOS <3 / Initial given by admissions     Dannette Barbara 08/29/2022, 8:58 AM

## 2022-08-29 NOTE — Progress Notes (Signed)
Patient admitted/orientated to the unit and placed on telemetry. A skin assessment was completed by myself and Wendall Mola. The patient has bruising to the left side of her face, a knot on the left side of her forehead, bruising to the upper and lower extremities. No other skin issues noted at this time.

## 2022-08-29 NOTE — Progress Notes (Signed)
Pharmacy Antibiotic Note  Elizabeth [name] is a 81 y.o. female admitted on 08/27/2022 with sepsis.  Pharmacy has been consulted for Vanc, Cefepime dosing.  Plan: Continue cefepime 2 gram Q12H  Height: '5\' 3"'$  (160 cm) Weight: 73.2 kg (161 lb 4.8 oz) IBW/kg (Calculated) : 52.4  Temp (24hrs), Avg:97.9 F (36.6 C), Min:97.8 F (36.6 C), Max:98 F (36.7 C)  Recent Labs  Lab 08/27/22 1921 08/28/22 0414 08/29/22 0516  WBC 29.1* 23.4* 11.8*  CREATININE 1.51* 1.18* 0.95  LATICACIDVEN 1.4  --   --      Estimated Creatinine Clearance: 43.8 mL/min (by C-G formula based on SCr of 0.95 mg/dL).    Allergies  Allergen Reactions   Neosporin [Neomycin-Bacitracin Zn-Polymyx] Other (See Comments)    Skin rash or redness   Ceclor [Cefaclor] Other (See Comments)    Unsure of reaction type   Other     Other reaction(s): Other (See Comments) sulfasaline-Does not remember   Polysorbate Other (See Comments)   Relafen [Nabumetone] Other (See Comments)    Unsure of reaction type   Sulfasalazine Other (See Comments)    Unsure of reaction type   Amoxicillin Nausea And Vomiting    Has patient had a PCN reaction causing immediate rash, facial/tongue/throat swelling, SOB or lightheadedness with hypotension: No Has patient had a PCN reaction causing severe rash involving mucus membranes or skin necrosis:No Has patient had a PCN reaction that required hospitalization: No Has patient had a PCN reaction occurring within the last 10 years: No If all of the above answers are "NO", then may proceed with Cephalosporin use.    Band-Aid Plus Antibiotic [Bacitracin-Polymyxin B] Other (See Comments)    ADHESIVE, NOT ANTIBIOTIC Can use paper tape PULLS SKIN OFF Other reaction(s): Other (See Comments) PULLS SKIN OFF PULLS SKIN OFF   Clindamycin Rash   Niacin     Other reaction(s): Other (See Comments) flushing   Niaspan [Niacin Er] Other (See Comments)    FLUSHING   Penicillin V Potassium Other (See  Comments)    Has patient had a PCN reaction causing immediate rash, facial/tongue/throat swelling, SOB or lightheadedness with hypotension: Unknown Has patient had a PCN reaction causing severe rash involving mucus membranes or skin necrosis: Unknown Has patient had a PCN reaction that required hospitalization: No Has patient had a PCN reaction occurring within the last 10 years: No If all of the above answers are "NO", then may proceed with Cephalosporin use.    Penicillins Nausea And Vomiting    Happened so long ago and patient is not sure of reaction   Statins Other (See Comments)    Other reaction(s): Other (See Comments) arthralgia ARTHRALGIA arthralgia    Dose adjustments this admission:   Microbiology results:  BCx: NG  MRSA PCR: neg   Thank you for allowing pharmacy to be a part of this patient's care.  Dorothe Pea, PharmD, BCPS Clinical Pharmacist   08/29/2022 8:15 AM

## 2022-08-29 NOTE — Inpatient Diabetes Management (Addendum)
Inpatient Diabetes Program Recommendations  AACE/ADA: New Consensus Statement on Inpatient Glycemic Control (2015)  Target Ranges:  Prepandial:   less than 140 mg/dL      Peak postprandial:   less than 180 mg/dL (1-2 hours)      Critically ill patients:  140 - 180 mg/dL    Latest Reference Range & Units 08/29/22 01:44 08/29/22 07:29  Glucose-Capillary 70 - 99 mg/dL 124 (H) 137 (H)  (H): Data is abnormally high  Admit: Sepsis/ Pneumonia  History: DM1 (requires basal, correction, and carbohydrate coverage insulin)  Outpatient DM med: T-Slim insulin pump with Novolog  Current orders: Insulin Pump AC&HS and 2 am     Met w/ pt at bedside this AM.  Pt A&O and able to independently manage her home insulin pump.  Has Dexcom CGM attached and running.  Pt stated she is current with Dr. Honor Junes (ENDO) and changed her insertion site last PM.  Family can bring additional pump supplies if needed.  Stated she has about 130 units insulin left in the reservoir.  Reviewed with her that we will check her CBGs with hospital CBG meter and pt OK with this.  Reviewed current fingerstick CBGs with pt.  Pt appreciative of visit and told me she is hopeful to go home tomorrow.  Sent [place] to RN caring for pt to remind RN to check fingerstick CBGs with hospital meter and chart all insulin boluses given by pt with her pump.  Patient was recently inpatient 08/14/22-08/17/22 and was seen by inpatient diabetes coordinator on 08/15/22. Patient sees Dr. Honor Junes (Endocrinologist) and was last seen 03/27/22; per office note insulin pump settings should be: Basal rates Midnight = 0.45 6 AM = 0.625 9 AM = 0.625 2 PM = 0.625 6 PM = 0.45 TDD basal: 12.9 units  Bolus settings I/C: 9 at midnight, 12 at 2 PM ISF: 52 at midnight, 55 at 9 AM Target Glucose: 110 at midnight, 130 at 6 PM Active insulin time: 4 hours     --Will follow patient during hospitalization--  Wyn Quaker RN, MSN, [place] [facility]  Coordinator Inpatient Glycemic Control Team Team Pager: [phone] (8a-5p)

## 2022-08-29 NOTE — Progress Notes (Signed)
[facility]  PROGRESS NOTE  Elizabeth [name] RKY:[id number] DOB: 1941-02-26 DOA: 08/27/2022 PCP: Baxter Hire, MD   Brief HPI:   81 year old female with medical history of hypothyroidism, osteoarthritis, diabetes mellitus type 1 on insulin pump, CKD stage IIIb recently hospitalized in the ICU from 08/14/2022 to 08/17/2022 with flulike symptoms, COVID-positive with respiratory failure as well as HH NK and AKI and circulatory shock requiring pressors. Came to ED with nausea vomiting.  For past 2 to 4 days she developed cough with anterior chest pain.  She was seen by ENT had nasolaryngoscopy and found to have sinusitis.  Patient started on amoxicillin and cough medicine.  In the ED blood pressure was 87 x 42, pulse 105, respiration 23, O2 sats 84% on room air. Blood pressure improved after 1.5 L of fluid bolus.  WBC 29,000, procalcitonin 1.36.  Lactic acid 1.4.  Chest x-ray showed patchy left lower lobe opacity suspicious for pneumonia. Patient was started on cefepime and vancomycin.    Subjective   Patient seen and examined, feels better this morning.  WBC is down to 11,000.  She is afebrile.   Assessment/Plan:    Severe sepsis -Presented with hypotension, tachycardia, tachypnea with hypoxemia, procalcitonin 1.36, WBC 29,000 -Source likely sinusitis versus pneumonia -Patient started on vancomycin and cefepime -Procalcitonin has improved to 0.89, lactic acid 1.4 -WBC is down to 11,000 this morning  Community-acquired pneumonia/acute hypoxemic respiratory failure -Likely postviral bacterial pneumonia from COVID 19 infection -Started on antibiotics as above -Continue oxygen, keep O2 sats more than 92% -DuoNebs as needed  Acute bacterial sinusitis -Diagnosed by ENT provider recently -Started on antibiotics as above  LADA, 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 intake  Medications     enoxaparin (LOVENOX) injection  40 mg Subcutaneous QHS   insulin pump   Subcutaneous TID WC, HS, 0200   levothyroxine  87.5 mcg Oral Q0600   melatonin  10 mg Oral QHS     Data Reviewed:   CBG:  Recent Labs  Lab 08/29/22 0144 08/29/22 0729 08/29/22 1232  GLUCAP 124* 137* 123*    SpO2: 99 % O2 Flow Rate (L/min): 3 L/min    Vitals:   08/29/22 0500 08/29/22 0544 08/29/22 0728 08/29/22 1231  BP:  128/60 (!) 126/59 138/61  Pulse:  71 73 66  Resp:  '17 18 18  '$ Temp:  97.9 F (36.6 C) 98 F (36.7 C)   TempSrc:      SpO2:  98% 98% 99%  Weight: 73.2 kg     Height:          Data Reviewed:  Basic Metabolic Panel: Recent Labs  Lab 08/27/22 1921 08/28/22 0414 08/29/22 0516  NA 128* 133* 136  K 4.2 4.4 4.4  CL 95* 102 107  CO2 22 20* 22  GLUCOSE 149* 137* 145*  BUN 25* 27* 23  CREATININE 1.51* 1.18* 0.95  CALCIUM 8.4* 7.2* 8.0*    CBC: Recent Labs  Lab 08/27/22 1921 08/28/22 0414 08/29/22 0516  WBC 29.1* 23.4* 11.8*  NEUTROABS 27.6*  --   --   HGB 11.3* 9.3* 9.8*  HCT 33.8* 28.5* 29.7*  MCV 88.9 90.5 88.4  PLT 439* 384 429*  LFT Recent Labs  Lab 08/27/22 1921  AST 28  ALT 16  ALKPHOS 112  BILITOT 1.3*  PROT 7.3  ALBUMIN 2.8*     Antibiotics: Anti-infectives (From admission, onward)    Start     Dose/Rate Route Frequency Ordered Stop   08/30/22 0100  vancomycin (VANCOREADY) IVPB 750 mg/150 mL  Status:  Discontinued        750 mg 150 mL/hr over 60 Minutes Intravenous Every 48 hours 08/28/22 0226 08/28/22 0233   08/30/22 0100  vancomycin (VANCOREADY) IVPB 750 mg/150 mL  Status:  Discontinued        750 mg 150 mL/hr over 60 Minutes Intravenous Every 48 hours 08/28/22 0233 08/28/22 0823    08/29/22 0000  vancomycin (VANCOREADY) IVPB 750 mg/150 mL  Status:  Discontinued        750 mg 150 mL/hr over 60 Minutes Intravenous Every 24 hours 08/28/22 0823 08/28/22 1458   08/28/22 2300  ceFEPIme (MAXIPIME) 2 g in sodium chloride 0.9 % 100 mL IVPB  Status:  Discontinued        2 g 200 mL/hr over 30 Minutes Intravenous Every 24 hours 08/28/22 0215 08/28/22 0231   08/28/22 2300  ceFEPIme (MAXIPIME) 2 g in sodium chloride 0.9 % 100 mL IVPB  Status:  Discontinued        2 g 200 mL/hr over 30 Minutes Intravenous Every 24 hours 08/28/22 0231 08/28/22 0808   08/28/22 1000  ceFEPIme (MAXIPIME) 2 g in sodium chloride 0.9 % 100 mL IVPB        2 g 200 mL/hr over 30 Minutes Intravenous Every 12 hours 08/28/22 0808 09/03/22 0959   08/27/22 2230  ceFEPIme (MAXIPIME) 2 g in sodium chloride 0.9 % 100 mL IVPB        2 g 200 mL/hr over 30 Minutes Intravenous  Once 08/27/22 2216 08/27/22 2355   08/27/22 2230  vancomycin (VANCOREADY) IVPB 1750 mg/350 mL        1,750 mg 175 mL/hr over 120 Minutes Intravenous  Once 08/27/22 2216 08/28/22 0253        DVT prophylaxis: Lovenox  Code Status: Full code  Family Communication: No family at bedside   CONSULTS    Objective    Physical Examination:   General-appears in no acute distress Heart-S1-S2, regular, no murmur auscultated Lungs-clear to auscultation bilaterally, no wheezing or crackles auscultated Abdomen-soft, nontender, no organomegaly Extremities-no edema in the lower extremities Neuro-alert, oriented x3, no focal deficit noted  Status is: Inpatient:             Oswald Hillock   [facility] If 7PM-7AM, please contact night-coverage at [email or web address], Office  [phone]   08/29/2022, 2:04 PM  LOS: 2 days

## 2022-08-30 LAB — BASIC METABOLIC PANEL
Anion gap: 6 (ref 5–15)
BUN: 12 mg/dL (ref 8–23)
CO2: 23 mmol/L (ref 22–32)
Calcium: 8.3 mg/dL — ABNORMAL LOW (ref 8.9–10.3)
Chloride: 108 mmol/L (ref 98–111)
Creatinine, Ser: 0.77 mg/dL (ref 0.44–1.00)
GFR, Estimated: >60 mL/min (ref >60)
Glucose, Bld: 117 mg/dL — ABNORMAL HIGH (ref 70–99)
Potassium: 4.1 mmol/L (ref 3.5–5.1)
Sodium: 137 mmol/L (ref 135–145)

## 2022-08-30 LAB — CBC
HCT: 32.3 % — ABNORMAL LOW (ref 36.0–46.0)
Hemoglobin: 10.8 g/dL — ABNORMAL LOW (ref 12.0–15.0)
MCH: 29.8 pg (ref 26.0–34.0)
MCHC: 33.4 g/dL (ref 30.0–36.0)
MCV: 89 fL (ref 80.0–100.0)
Platelets: 425 10*3/uL — ABNORMAL HIGH (ref 150–400)
RBC: 3.63 MIL/uL — ABNORMAL LOW (ref 3.87–5.11)
RDW: 14.3 % (ref 11.5–15.5)
WBC: 9.3 10*3/uL (ref 4.0–10.5)
nRBC: 0 % (ref 0.0–0.2)

## 2022-08-30 LAB — GLUCOSE, CAPILLARY
Glucose-Capillary: 121 mg/dL — ABNORMAL HIGH (ref 70–99)
Glucose-Capillary: 113 mg/dL — ABNORMAL HIGH (ref 70–99)
Glucose-Capillary: 105 mg/dL — ABNORMAL HIGH (ref 70–99)
Glucose-Capillary: 164 mg/dL — ABNORMAL HIGH (ref 70–99)

## 2022-08-30 MED ORDER — CEFUROXIME AXETIL 500 MG PO TABS: 500.0000 mg | ORAL_TABLET | Freq: Two times a day (BID) | ORAL | 0 refills | Status: AC

## 2022-08-30 MED ORDER — GUAIFENESIN ER 600 MG PO TB12: 600.0000 mg | ORAL_TABLET | Freq: Two times a day (BID) | ORAL | 0 refills | Status: AC

## 2022-08-30 MED ORDER — ONDANSETRON HCL 4 MG PO TABS: 4.0000 mg | ORAL_TABLET | Freq: Three times a day (TID) | ORAL | 0 refills | Status: AC | PRN

## 2022-08-30 NOTE — Discharge Summary (Addendum)
Physician Discharge Summary   Patient: Elizabeth [name] MRN: [id number] DOB: [birth year]  Admit date:     08/27/2022  Discharge date: 08/30/22  Discharge Physician: Oswald Hillock   PCP: Baxter Hire, MD   Recommendations at discharge:   Follow-up PCP in 1 week  Discharge Diagnoses: Principal Problem:   Severe sepsis ([place]) Active Problems:   HCAP (healthcare-associated pneumonia)   Acute bacterial sinusitis   Acute hypoxemic respiratory failure (HCC)   LADA (latent autoimmune diabetes in adults), managed as type 1 ([place])   Insulin pump in place   Hypothyroidism  Resolved Problems:   * No resolved hospital problems. [facility]: 81 year old female with medical history of hypothyroidism, osteoarthritis, diabetes mellitus type 1 on insulin pump, CKD stage IIIb recently hospitalized in the ICU from 08/14/2022 to 08/17/2022 with flulike symptoms, COVID-positive with respiratory failure as well as HH NK and AKI and circulatory shock requiring pressors. Came to ED with nausea vomiting.  For past 2 to 4 days she developed cough with anterior chest pain.  She was seen by ENT had nasolaryngoscopy and found to have sinusitis.  Patient started on amoxicillin and cough medicine.   In the ED blood pressure was 87 x 42, pulse 105, respiration 23, O2 sats 84% on room air. Blood pressure improved after 1.5 L of fluid bolus.  WBC 29,000, procalcitonin 1.36.  Lactic acid 1.4.  Chest x-ray showed patchy left lower lobe opacity suspicious for pneumonia. Patient was started on cefepime and vancomycin.    Assessment and Plan:  * Severe sepsis ([place]) Severe sepsis criteria includes hypotension, tachycardia and tachypnea on arrival as well as leukocytosis with procalcitonin 1.36.  Lactic acid pending Sepsis source includes pneumonia and possible sinusitis Sepsis fluids Treat acute infection as will be outlined below  HCAP (healthcare-associated pneumonia) Acute respiratory failure with  hypoxia Recently treated for COVID from 9/28 to 10/1 Likely postviral bacterial pneumonia Cefepime and vancomycin Oxygen saturation on arrival 84 on room air Pulm [place] oxygen to keep sats over 92 Antitussives, DuoNebs as needed and supportive care  Acute bacterial sinusitis Will treat with antibiotics for pneumonia as described Decongestants, saline sprays and supportive care  LADA (latent autoimmune diabetes in adults), managed as type 1 (HCC) Insulin pump Sliding scale insulin and then patient can resume insulin pump once well enough to do so  Hypothyroidism Continue levothyroxine  Severe sepsis -Presented with hypotension, tachycardia, tachypnea with hypoxemia, procalcitonin 1.36, WBC 29,000 -Source likely sinusitis versus pneumonia -Patient started on vancomycin and cefepime -Procalcitonin has improved to 0.60, lactic acid 1.4 -WBC is down to 9,000 this morning -We will discharge on Ceftin 500 mg p.o. twice daily for 3 more days   Community-acquired pneumonia/acute hypoxemic respiratory failure -Likely postviral bacterial pneumonia from COVID 19 infection -Started on antibiotics as above -Oxygen has been weaned off to room air -We will discharge on Ceftin 500 mg p.o. twice daily for 3 more days    Acute bacterial sinusitis -Diagnosed by ENT provider recently -Started on antibiotics as above  -Also discharged on Mucinex 600 mg p.o. twice daily for 5 more days  LADA, latent autoimmune diabetes in adults -Treated as type 1 diabetes -Patient is on insulin pump 3 times daily with meals and nightly    Hypothyroidism -Continue Synthroid   CKD stage IIIb -Creatinine is 1.18 -At baseline   Mild hyponatremia -Presented with sodium of 128 -Improved 137 this morning -Likely from dehydration/poor p.o. intake       Consultants:  Procedures performed:  Disposition: Home Diet recommendation:  Discharge Diet Orders (From admission, onward)     Start     Ordered    08/30/22 0000  Diet - low sodium heart healthy        08/30/22 1534           Carb modified diet DISCHARGE MEDICATION: Allergies as of 08/30/2022       Reactions   Neosporin [neomycin-bacitracin Zn-polymyx] Other (See Comments)   Skin rash or redness   Ceclor [cefaclor] Other (See Comments)   Unsure of reaction type   Other    Other reaction(s): Other (See Comments) sulfasaline-Does not remember   Polysorbate Other (See Comments)   Relafen [nabumetone] Other (See Comments)   Unsure of reaction type   Sulfasalazine Other (See Comments)   Unsure of reaction type   Amoxicillin Nausea And Vomiting   Has patient had a PCN reaction causing immediate rash, facial/tongue/throat swelling, SOB or lightheadedness with hypotension: No Has patient had a PCN reaction causing severe rash involving mucus membranes or skin necrosis:No Has patient had a PCN reaction that required hospitalization: No Has patient had a PCN reaction occurring within the last 10 years: No If all of the above answers are "NO", then may proceed with Cephalosporin use.   Band-aid Plus Antibiotic [bacitracin-polymyxin B] Other (See Comments)   ADHESIVE, NOT ANTIBIOTIC Can use paper tape PULLS SKIN OFF Other reaction(s): Other (See Comments) PULLS SKIN OFF PULLS SKIN OFF   Clindamycin Rash   Niacin    Other reaction(s): Other (See Comments) flushing   Niaspan [niacin Er] Other (See Comments)   FLUSHING   Penicillin V Potassium Other (See Comments)   Has patient had a PCN reaction causing immediate rash, facial/tongue/throat swelling, SOB or lightheadedness with hypotension: Unknown Has patient had a PCN reaction causing severe rash involving mucus membranes or skin necrosis: Unknown Has patient had a PCN reaction that required hospitalization: No Has patient had a PCN reaction occurring within the last 10 years: No If all of the above answers are "NO", then may proceed with Cephalosporin use.   Penicillins  Nausea And Vomiting   Happened so long ago and patient is not sure of reaction   Statins Other (See Comments)   Other reaction(s): Other (See Comments) arthralgia ARTHRALGIA arthralgia        Medication List     STOP taking these medications    pseudoephedrine-guaifenesin 60-600 MG 12 hr tablet Commonly known as: MUCINEX D       TAKE these medications    Biotin 10000 MCG Tabs Take 10,000 mcg by mouth daily at 12 noon.   CALCIUM 600+D3 PO Take 2 tablets by mouth daily.   cefUROXime 500 MG tablet Commonly known as: CEFTIN Take 1 tablet (500 mg total) by mouth 2 (two) times daily with a meal for 3 days.   cyanocobalamin 1000 MCG tablet Commonly known as: VITAMIN B12 Take 1,000 mcg by mouth daily at 12 noon.   glucagon 1 MG injection Inject 1 mg into the muscle daily as needed (for low blood sugars). Reported on 02/21/2016   glucose 5 g chewable tablet Chew 15 g by mouth 3 (three) times daily as needed for low blood sugar.   guaiFENesin 600 MG 12 hr tablet Commonly known as: Mucinex Take 1 tablet (600 mg total) by mouth 2 (two) times daily for 5 days.   insulin pump Soln Inject into the skin. NOVOLOG 100 UNIT/ML injection   levothyroxine 175 MCG  tablet Commonly known as: SYNTHROID Take 87.5 mcg by mouth every morning. What changed: Another medication with the same name was removed. Continue taking this medication, and follow the directions you see here.   lidocaine 2 % solution Commonly known as: XYLOCAINE Use as directed 5 mLs in the mouth or throat 4 (four) times daily as needed for pain.   Melatonin 10 MG Tabs Take 10 mg by mouth at bedtime.   ondansetron 4 MG tablet Commonly known as: Zofran Take 1 tablet (4 mg total) by mouth every 8 (eight) hours as needed for nausea or vomiting.   rosuvastatin 10 MG tablet Commonly known as: CRESTOR Take 10 mg by mouth at bedtime.        Follow-up Information     Baxter Hire, MD Follow up in 1 week(s).    Specialty: Internal Medicine Contact information: [place] [address] [phone]                Discharge Exam: Danley Danker Weights   08/27/22 1915 08/29/22 0057 08/29/22 0500  Weight: 70.3 kg 73.3 kg 73.2 kg   General-appears in no acute distress Heart-S1-S2, regular, no murmur auscultated Lungs-clear to auscultation bilaterally, no wheezing or crackles auscultated Abdomen-soft, nontender, no organomegaly Extremities-no edema in the lower extremities Neuro-alert, oriented x3, no focal deficit noted  Condition at discharge: good  The results of significant diagnostics from this hospitalization (including imaging, microbiology, ancillary and laboratory) are listed below for reference.   Imaging Studies: DG Chest 1 View  Result Date: 08/27/2022 CLINICAL DATA:  Cough, sore throat, nausea/vomiting EXAM: CHEST  1 VIEW COMPARISON:  08/06/2022 FINDINGS: Patchy left lower lobe opacity, suspicious for pneumonia. Right lung is clear. No pleural effusion or pneumothorax. The heart is normal in size. Cervical spine fixation hardware. IMPRESSION: Patchy left lower lobe opacity, suspicious for pneumonia. Electronically Signed   By: Julian Hy M.D.   On: 08/27/2022 21:45   CT CHEST ABDOMEN PELVIS WO CONTRAST  Result Date: 08/14/2022 CLINICAL DATA:  Sepsis.  COVID positive. EXAM: CT CHEST, ABDOMEN AND PELVIS WITHOUT CONTRAST TECHNIQUE: Multidetector CT imaging of the chest, abdomen and pelvis was performed following the standard protocol without IV contrast. RADIATION DOSE REDUCTION: This exam was performed according to the departmental dose-optimization program which includes automated exposure control, adjustment of the mA and/or kV according to patient size and/or use of iterative reconstruction technique. COMPARISON:  CT scan from 2010 FINDINGS: CT CHEST FINDINGS Cardiovascular: The heart is normal in size. No pericardial effusion. There is mild tortuosity and  moderate atherosclerotic calcification of the thoracic aorta. No focal aneurysm. Three-vessel coronary artery calcifications are noted. Mediastinum/Nodes: No mediastinal or hilar mass or lymphadenopathy. Small scattered lymph nodes are noted. The esophagus is grossly normal. Lungs/Pleura: Azygous fissure is noted. Mild emphysematous changes with areas of paraseptal bulla. No pulmonary infiltrates or pleural effusions. No pulmonary edema. Patchy areas bronchiectasis. Musculoskeletal: No breast masses, supraclavicular or axillary adenopathy. The bony thorax is intact. CT ABDOMEN PELVIS FINDINGS Hepatobiliary: No hepatic lesions or intrahepatic biliary dilatation. The gallbladder is surgically absent. No common bile duct dilatation. Pancreas: No mass, inflammation or ductal dilatation. Spleen: Normal size.  No focal lesions. Adrenals/Urinary Tract: The adrenal glands are unremarkable. There is a rim calcified cyst projecting off the lower pole region of the right kidney. This measures a maximum of 3.8 cm and measures 19 Hounsfield units. This was present on the prior study from 2010 but measured 9 cm on that study. No  further imaging evaluation follow-up is necessary. No renal or obstructing ureteral calculi. The bladder contains a Foley catheter Stomach/Bowel: The stomach, duodenum small bowel and colon are grossly. No acute inflammatory process, mass lesions or obstructive findings. The terminal ileum and appendix are. Vascular/Lymphatic: Advanced atherosclerotic calcification involving the aorta and iliac arteries but no aneurysm. No mesenteric or retroperitoneal mass or adenopathy. Reproductive: Surgically absent. Other: No pelvic mass or adenopathy. No free pelvic fluid collections. No inguinal mass or adenopathy. No abdominal wall hernia or subcutaneous lesions. Musculoskeletal: No significant bony findings. Moderate degenerative changes involving the lumbar spine. IMPRESSION: 1. No acute abdominal/pelvic  findings, mass lesions or adenopathy. 2. Mild emphysematous changes with areas of paraseptal bulla. No pulmonary infiltrates or pleural effusions. 3. Status post cholecystectomy. No biliary dilatation. 4. Advanced atherosclerotic calcification involving the thoracic and abdominal aorta and iliac arteries. Electronically Signed   By: Marijo Sanes M.D.   On: 08/14/2022 12:20   DG Chest Port 1 View  Result Date: 08/14/2022 CLINICAL DATA:  Syncope EXAM: PORTABLE CHEST 1 VIEW COMPARISON:  01/23/2009 FINDINGS: The heart size and mediastinal contours are within normal limits. Both lungs are clear. The visualized skeletal structures are unremarkable. IMPRESSION: No active disease. Electronically Signed   By: Kathreen Devoid M.D.   On: 08/14/2022 10:00    Microbiology: Results for orders placed or performed during the hospital encounter of 08/27/22  Blood culture (routine x 2)     Status: None (Preliminary result)   Collection Time: 08/27/22  7:21 PM   Specimen: BLOOD  Result Value Ref Range Status   Specimen Description BLOOD LEFT ANTECUBITAL  Final   Special Requests   Final    BOTTLES DRAWN AEROBIC AND ANAEROBIC Blood Culture adequate volume   Culture   Final    NO GROWTH 3 DAYS Performed at [facility], [address] [place], [place] [address]    Report Status PENDING  Incomplete  Blood culture (routine x 2)     Status: None (Preliminary result)   Collection Time: 08/27/22  7:28 PM   Specimen: BLOOD  Result Value Ref Range Status   Specimen Description BLOOD RIGHT ANTECUBITAL  Final   Special Requests   Final    BOTTLES DRAWN AEROBIC AND ANAEROBIC Blood Culture results may not be optimal due to an inadequate volume of blood received in culture bottles   Culture   Final    NO GROWTH 3 DAYS Performed at [facility], [address] [place], [place] [address]    Report Status PENDING  Incomplete  MRSA Next Gen by PCR, Nasal     Status: None   Collection Time: 08/27/22  11:12 PM   Specimen: Nasal Mucosa; Nasal Swab  Result Value Ref Range Status   MRSA by PCR Next Gen NOT DETECTED NOT DETECTED Final    Comment: (NOTE) The GeneXpert MRSA Assay (FDA approved for NASAL specimens only), is one component of a comprehensive MRSA colonization surveillance program. It is not intended to diagnose MRSA infection nor to guide or monitor treatment for MRSA infections. Test performance is not FDA approved in patients less than 51 years old. Performed at [facility], [address] [place], [place] [address]     Labs: CBC: Recent Labs  Lab 08/27/22 1921 08/28/22 0414 08/29/22 0516 08/30/22 0525  WBC 29.1* 23.4* 11.8* 9.3  NEUTROABS 27.6*  --   --   --   HGB 11.3* 9.3* 9.8* 10.8*  HCT 33.8* 28.5* 29.7* 32.3*  MCV 88.9 90.5 88.4 89.0  PLT 439* 384 429* 637*   Basic Metabolic Panel: Recent Labs  Lab 08/27/22 1921 08/28/22 0414 08/29/22 0516 08/30/22 0525  NA 128* 133* 136 137  K 4.2 4.4 4.4 4.1  CL 95* 102 107 108  CO2 22 20* 22 23  GLUCOSE 149* 137* 145* 117*  BUN 25* 27* 23 12  CREATININE 1.51* 1.18* 0.95 0.77  CALCIUM 8.4* 7.2* 8.0* 8.3*   Liver Function Tests: Recent Labs  Lab 08/27/22 1921  AST 28  ALT 16  ALKPHOS 112  BILITOT 1.3*  PROT 7.3  ALBUMIN 2.8*   CBG: Recent Labs  Lab 08/29/22 1652 08/29/22 2132 08/30/22 0225 08/30/22 0815 08/30/22 1137  GLUCAP 148* 158* 121* 113* 105*    Discharge time spent: greater than 30 minutes.  Signed: Oswald Hillock, MD [facility] 08/30/2022

## 2022-08-30 NOTE — Progress Notes (Signed)
Discussed discharge information with patient and husband, including medications and follow up appointments.  Sent AVS paperwork home with patient.

## 2022-09-01 LAB — CULTURE, BLOOD (ROUTINE X 2)
Culture: NO GROWTH
Culture: NO GROWTH
Special Requests: ADEQUATE

## 2022-09-03 DIAGNOSIS — E10649 Type 1 diabetes mellitus with hypoglycemia without coma: Secondary | ICD-10-CM | POA: Diagnosis not present

## 2022-09-03 DIAGNOSIS — J189 Pneumonia, unspecified organism: Secondary | ICD-10-CM | POA: Diagnosis not present

## 2022-09-03 DIAGNOSIS — Z09 Encounter for follow-up examination after completed treatment for conditions other than malignant neoplasm: Secondary | ICD-10-CM | POA: Diagnosis not present

## 2022-09-03 DIAGNOSIS — J90 Pleural effusion, not elsewhere classified: Secondary | ICD-10-CM | POA: Diagnosis not present

## 2022-09-10 DIAGNOSIS — J188 Other pneumonia, unspecified organism: Secondary | ICD-10-CM | POA: Diagnosis not present

## 2022-09-10 DIAGNOSIS — J189 Pneumonia, unspecified organism: Secondary | ICD-10-CM | POA: Diagnosis not present

## 2022-09-15 DIAGNOSIS — E113293 Type 2 diabetes mellitus with mild nonproliferative diabetic retinopathy without macular edema, bilateral: Secondary | ICD-10-CM | POA: Diagnosis not present

## 2022-09-17 DIAGNOSIS — E109 Type 1 diabetes mellitus without complications: Secondary | ICD-10-CM | POA: Diagnosis not present

## 2022-09-25 DIAGNOSIS — E785 Hyperlipidemia, unspecified: Secondary | ICD-10-CM | POA: Diagnosis not present

## 2022-09-25 DIAGNOSIS — E039 Hypothyroidism, unspecified: Secondary | ICD-10-CM | POA: Diagnosis not present

## 2022-09-25 DIAGNOSIS — E1069 Type 1 diabetes mellitus with other specified complication: Secondary | ICD-10-CM | POA: Diagnosis not present

## 2022-09-25 DIAGNOSIS — E109 Type 1 diabetes mellitus without complications: Secondary | ICD-10-CM | POA: Diagnosis not present

## 2022-10-20 DIAGNOSIS — E109 Type 1 diabetes mellitus without complications: Secondary | ICD-10-CM | POA: Diagnosis not present

## 2022-10-20 DIAGNOSIS — E039 Hypothyroidism, unspecified: Secondary | ICD-10-CM | POA: Diagnosis not present

## 2022-10-28 DIAGNOSIS — E785 Hyperlipidemia, unspecified: Secondary | ICD-10-CM | POA: Diagnosis not present

## 2022-10-28 DIAGNOSIS — Z0001 Encounter for general adult medical examination with abnormal findings: Secondary | ICD-10-CM | POA: Diagnosis not present

## 2022-10-28 DIAGNOSIS — E1069 Type 1 diabetes mellitus with other specified complication: Secondary | ICD-10-CM | POA: Diagnosis not present

## 2022-10-28 DIAGNOSIS — E039 Hypothyroidism, unspecified: Secondary | ICD-10-CM | POA: Diagnosis not present

## 2022-10-28 DIAGNOSIS — E538 Deficiency of other specified B group vitamins: Secondary | ICD-10-CM | POA: Diagnosis not present

## 2022-10-28 DIAGNOSIS — E109 Type 1 diabetes mellitus without complications: Secondary | ICD-10-CM | POA: Diagnosis not present

## 2022-11-19 DIAGNOSIS — E109 Type 1 diabetes mellitus without complications: Secondary | ICD-10-CM | POA: Diagnosis not present

## 2022-11-25 ENCOUNTER — Ambulatory Visit
Admission: RE | Admit: 2022-11-25 | Discharge: 2022-11-25 | Disposition: A | Discharge status: Home / Self Care | Payer: PPO | Source: Ambulatory Visit | Attending: Internal Medicine | Admitting: Internal Medicine

## 2022-11-25 DIAGNOSIS — Z1231 Encounter for screening mammogram for malignant neoplasm of breast: Secondary | ICD-10-CM

## 2022-12-02 DIAGNOSIS — E109 Type 1 diabetes mellitus without complications: Secondary | ICD-10-CM | POA: Diagnosis not present

## 2022-12-05 DIAGNOSIS — E109 Type 1 diabetes mellitus without complications: Secondary | ICD-10-CM | POA: Diagnosis not present

## 2022-12-05 DIAGNOSIS — J069 Acute upper respiratory infection, unspecified: Secondary | ICD-10-CM | POA: Diagnosis not present

## 2022-12-17 DIAGNOSIS — Z8701 Personal history of pneumonia (recurrent): Secondary | ICD-10-CM | POA: Diagnosis not present

## 2022-12-17 DIAGNOSIS — E109 Type 1 diabetes mellitus without complications: Secondary | ICD-10-CM | POA: Diagnosis not present

## 2022-12-17 DIAGNOSIS — R059 Cough, unspecified: Secondary | ICD-10-CM | POA: Diagnosis not present

## 2022-12-17 DIAGNOSIS — J40 Bronchitis, not specified as acute or chronic: Secondary | ICD-10-CM | POA: Diagnosis not present

## 2022-12-19 DIAGNOSIS — E109 Type 1 diabetes mellitus without complications: Secondary | ICD-10-CM | POA: Diagnosis not present

## 2023-01-01 DIAGNOSIS — R079 Chest pain, unspecified: Secondary | ICD-10-CM | POA: Diagnosis not present

## 2023-01-01 DIAGNOSIS — E1069 Type 1 diabetes mellitus with other specified complication: Secondary | ICD-10-CM | POA: Diagnosis not present

## 2023-01-01 DIAGNOSIS — R5383 Other fatigue: Secondary | ICD-10-CM | POA: Diagnosis not present

## 2023-01-01 DIAGNOSIS — R0789 Other chest pain: Secondary | ICD-10-CM | POA: Diagnosis not present

## 2023-01-01 DIAGNOSIS — E785 Hyperlipidemia, unspecified: Secondary | ICD-10-CM | POA: Diagnosis not present

## 2023-01-06 DIAGNOSIS — R0689 Other abnormalities of breathing: Secondary | ICD-10-CM | POA: Diagnosis not present

## 2023-01-06 DIAGNOSIS — R0600 Dyspnea, unspecified: Secondary | ICD-10-CM | POA: Diagnosis not present

## 2023-01-06 DIAGNOSIS — R0602 Shortness of breath: Secondary | ICD-10-CM | POA: Diagnosis not present

## 2023-01-06 DIAGNOSIS — R059 Cough, unspecified: Secondary | ICD-10-CM | POA: Diagnosis not present

## 2023-01-13 DIAGNOSIS — R0600 Dyspnea, unspecified: Secondary | ICD-10-CM | POA: Diagnosis not present

## 2023-01-13 DIAGNOSIS — R0789 Other chest pain: Secondary | ICD-10-CM | POA: Diagnosis not present

## 2023-02-13 DIAGNOSIS — E109 Type 1 diabetes mellitus without complications: Secondary | ICD-10-CM | POA: Diagnosis not present

## 2023-02-23 DIAGNOSIS — Z08 Encounter for follow-up examination after completed treatment for malignant neoplasm: Secondary | ICD-10-CM | POA: Diagnosis not present

## 2023-02-23 DIAGNOSIS — D2262 Melanocytic nevi of left upper limb, including shoulder: Secondary | ICD-10-CM | POA: Diagnosis not present

## 2023-02-23 DIAGNOSIS — D2272 Melanocytic nevi of left lower limb, including hip: Secondary | ICD-10-CM | POA: Diagnosis not present

## 2023-02-23 DIAGNOSIS — Z85828 Personal history of other malignant neoplasm of skin: Secondary | ICD-10-CM | POA: Diagnosis not present

## 2023-02-23 DIAGNOSIS — L821 Other seborrheic keratosis: Secondary | ICD-10-CM | POA: Diagnosis not present

## 2023-02-23 DIAGNOSIS — D225 Melanocytic nevi of trunk: Secondary | ICD-10-CM | POA: Diagnosis not present

## 2023-02-23 DIAGNOSIS — D2261 Melanocytic nevi of right upper limb, including shoulder: Secondary | ICD-10-CM | POA: Diagnosis not present

## 2023-02-23 DIAGNOSIS — D2271 Melanocytic nevi of right lower limb, including hip: Secondary | ICD-10-CM | POA: Diagnosis not present

## 2023-03-03 DIAGNOSIS — E109 Type 1 diabetes mellitus without complications: Secondary | ICD-10-CM | POA: Diagnosis not present

## 2023-03-22 DIAGNOSIS — Z5321 Procedure and treatment not carried out due to patient leaving prior to being seen by health care provider: Secondary | ICD-10-CM | POA: Diagnosis not present

## 2023-03-22 DIAGNOSIS — R739 Hyperglycemia, unspecified: Secondary | ICD-10-CM | POA: Diagnosis not present

## 2023-03-22 LAB — CBC WITH DIFFERENTIAL/PLATELET
Abs Immature Granulocytes: 0.02 10*3/uL (ref 0.00–0.07)
Basophils Absolute: 0.1 10*3/uL (ref 0.0–0.1)
Basophils Relative: 1 %
Eosinophils Absolute: 0.1 10*3/uL (ref 0.0–0.5)
Eosinophils Relative: 1 %
HCT: 38.9 % (ref 36.0–46.0)
Hemoglobin: 12.5 g/dL (ref 12.0–15.0)
Immature Granulocytes: 0 %
Lymphocytes Relative: 9 %
Lymphs Abs: 0.9 10*3/uL (ref 0.7–4.0)
MCH: 28.2 pg (ref 26.0–34.0)
MCHC: 32.1 g/dL (ref 30.0–36.0)
MCV: 87.8 fL (ref 80.0–100.0)
Monocytes Absolute: 0.7 10*3/uL (ref 0.1–1.0)
Monocytes Relative: 7 %
Neutro Abs: 7.8 10*3/uL — ABNORMAL HIGH (ref 1.7–7.7)
Neutrophils Relative %: 82 %
Platelets: 240 10*3/uL (ref 150–400)
RBC: 4.43 MIL/uL (ref 3.87–5.11)
RDW: 14.9 % (ref 11.5–15.5)
WBC: 9.6 10*3/uL (ref 4.0–10.5)
nRBC: 0 % (ref 0.0–0.2)

## 2023-03-22 LAB — COMPREHENSIVE METABOLIC PANEL
ALT: 12 U/L (ref 0–44)
AST: 23 U/L (ref 15–41)
Albumin: 3.8 g/dL (ref 3.5–5.0)
Alkaline Phosphatase: 85 U/L (ref 38–126)
Anion gap: 10 (ref 5–15)
BUN: 27 mg/dL — ABNORMAL HIGH (ref 8–23)
CO2: 22 mmol/L (ref 22–32)
Calcium: 9.1 mg/dL (ref 8.9–10.3)
Chloride: 93 mmol/L — ABNORMAL LOW (ref 98–111)
Creatinine, Ser: 1.09 mg/dL — ABNORMAL HIGH (ref 0.44–1.00)
GFR, Estimated: 50 mL/min — ABNORMAL LOW (ref >60)
Glucose, Bld: 530 mg/dL (ref 70–99)
Potassium: 5.1 mmol/L (ref 3.5–5.1)
Sodium: 125 mmol/L — ABNORMAL LOW (ref 135–145)
Total Bilirubin: 1.5 mg/dL — ABNORMAL HIGH (ref 0.3–1.2)
Total Protein: 7 g/dL (ref 6.5–8.1)

## 2023-03-22 LAB — URINALYSIS, ROUTINE W REFLEX MICROSCOPIC
Bacteria, UA: NONE SEEN
Bilirubin Urine: NEGATIVE
Glucose, UA: >=500 mg/dL — AB
Hgb urine dipstick: NEGATIVE
Ketones, ur: 5 mg/dL — AB
Leukocytes,Ua: NEGATIVE
Nitrite: NEGATIVE
Protein, ur: NEGATIVE mg/dL
Specific Gravity, Urine: 1.016 (ref 1.005–1.030)
pH: 5 (ref 5.0–8.0)

## 2023-03-22 LAB — CBG MONITORING, ED: Glucose-Capillary: 531 mg/dL (ref 70–99)

## 2023-03-22 NOTE — ED Triage Notes (Signed)
Pt ambulatory to triage with c/o hyperglycemia. Pt checked at home with CBG 530 on home meter. Pt took 6 units insulin apx 1030 pm tonight with no improvement.  Pt denies sx of hyperglycemia at current time.

## 2023-03-23 ENCOUNTER — Emergency Department
Admission: EM | Admit: 2023-03-23 | Discharge: 2023-03-23 | Discharge status: Against Medical Advice | Payer: PPO | Attending: Emergency Medicine | Admitting: Emergency Medicine

## 2023-03-23 ENCOUNTER — Telehealth: Payer: Self-pay | Admitting: Emergency Medicine

## 2023-03-23 NOTE — Telephone Encounter (Signed)
Called patient due to left emergency department before provider exam to inquire about condition and follow up plans.   Says she figureed out that her insulin pump was folded back and she probably was not getting insulin.   Says she has gotten the sugar down now.   Says her sugar was 129 before lunch.

## 2023-03-23 NOTE — ED Notes (Signed)
Pt states she got her blood sugar down below 400 and decided to go home, advised pt to follow up with PCP, pt verbalized understanding, advised pt to return to ED if she felt worse.

## 2023-03-25 DIAGNOSIS — E109 Type 1 diabetes mellitus without complications: Secondary | ICD-10-CM | POA: Diagnosis not present

## 2023-03-30 ENCOUNTER — Telehealth: Payer: Self-pay

## 2023-03-30 NOTE — Telephone Encounter (Signed)
Transition Care Management Unsuccessful Follow-up Telephone Call  Date of discharge and from where:  [place] 5/6  Attempts:  1st Attempt  Reason for unsuccessful TCM follow-up call:  Left voice message   Lenard Forth [facility] Guide, [facility] [phone] [address] [place], [place], [place] [address] Phone: [phone] Email: [email or web address]

## 2023-03-31 ENCOUNTER — Telehealth: Payer: Self-pay

## 2023-03-31 NOTE — Telephone Encounter (Signed)
Transition Care Management Unsuccessful Follow-up Telephone Call  Date of discharge and from where:  [place] 5/6  Attempts:  2nd Attempt  Reason for unsuccessful TCM follow-up call:  Unable to leave message   Shanta Dorvil [facility], [place] [phone] [address], [place], [place] [address] Phone: [phone] Email: [email or web address]       

## 2023-04-01 DIAGNOSIS — E1069 Type 1 diabetes mellitus with other specified complication: Secondary | ICD-10-CM | POA: Diagnosis not present

## 2023-04-01 DIAGNOSIS — E109 Type 1 diabetes mellitus without complications: Secondary | ICD-10-CM | POA: Diagnosis not present

## 2023-04-01 DIAGNOSIS — E039 Hypothyroidism, unspecified: Secondary | ICD-10-CM | POA: Diagnosis not present

## 2023-04-01 DIAGNOSIS — E785 Hyperlipidemia, unspecified: Secondary | ICD-10-CM | POA: Diagnosis not present

## 2023-06-22 DIAGNOSIS — E109 Type 1 diabetes mellitus without complications: Secondary | ICD-10-CM | POA: Diagnosis not present

## 2023-06-24 DIAGNOSIS — E109 Type 1 diabetes mellitus without complications: Secondary | ICD-10-CM | POA: Diagnosis not present

## 2023-06-24 DIAGNOSIS — E039 Hypothyroidism, unspecified: Secondary | ICD-10-CM | POA: Diagnosis not present

## 2023-07-02 DIAGNOSIS — E039 Hypothyroidism, unspecified: Secondary | ICD-10-CM | POA: Diagnosis not present

## 2023-07-02 DIAGNOSIS — E785 Hyperlipidemia, unspecified: Secondary | ICD-10-CM | POA: Diagnosis not present

## 2023-07-02 DIAGNOSIS — E538 Deficiency of other specified B group vitamins: Secondary | ICD-10-CM | POA: Diagnosis not present

## 2023-07-02 DIAGNOSIS — E1069 Type 1 diabetes mellitus with other specified complication: Secondary | ICD-10-CM | POA: Diagnosis not present

## 2023-07-02 DIAGNOSIS — E109 Type 1 diabetes mellitus without complications: Secondary | ICD-10-CM | POA: Diagnosis not present

## 2023-07-22 DIAGNOSIS — E109 Type 1 diabetes mellitus without complications: Secondary | ICD-10-CM | POA: Diagnosis not present

## 2023-08-21 DIAGNOSIS — E109 Type 1 diabetes mellitus without complications: Secondary | ICD-10-CM | POA: Diagnosis not present

## 2023-09-11 ENCOUNTER — Other Ambulatory Visit: Payer: Self-pay | Admitting: Internal Medicine

## 2023-09-11 DIAGNOSIS — Z1231 Encounter for screening mammogram for malignant neoplasm of breast: Secondary | ICD-10-CM

## 2023-09-21 DIAGNOSIS — E113212 Type 2 diabetes mellitus with mild nonproliferative diabetic retinopathy with macular edema, left eye: Secondary | ICD-10-CM | POA: Diagnosis not present

## 2023-09-21 DIAGNOSIS — H43813 Vitreous degeneration, bilateral: Secondary | ICD-10-CM | POA: Diagnosis not present

## 2023-09-21 DIAGNOSIS — E113211 Type 2 diabetes mellitus with mild nonproliferative diabetic retinopathy with macular edema, right eye: Secondary | ICD-10-CM | POA: Diagnosis not present

## 2023-09-21 DIAGNOSIS — H26492 Other secondary cataract, left eye: Secondary | ICD-10-CM | POA: Diagnosis not present

## 2023-09-30 DIAGNOSIS — E109 Type 1 diabetes mellitus without complications: Secondary | ICD-10-CM | POA: Diagnosis not present

## 2023-10-06 DIAGNOSIS — E109 Type 1 diabetes mellitus without complications: Secondary | ICD-10-CM | POA: Diagnosis not present

## 2023-10-06 DIAGNOSIS — E039 Hypothyroidism, unspecified: Secondary | ICD-10-CM | POA: Diagnosis not present

## 2023-10-06 DIAGNOSIS — E1069 Type 1 diabetes mellitus with other specified complication: Secondary | ICD-10-CM | POA: Diagnosis not present

## 2023-10-06 DIAGNOSIS — E785 Hyperlipidemia, unspecified: Secondary | ICD-10-CM | POA: Diagnosis not present

## 2023-10-08 DIAGNOSIS — H26492 Other secondary cataract, left eye: Secondary | ICD-10-CM | POA: Diagnosis not present

## 2023-10-14 DIAGNOSIS — E109 Type 1 diabetes mellitus without complications: Secondary | ICD-10-CM | POA: Diagnosis not present

## 2023-11-27 ENCOUNTER — Ambulatory Visit
Admission: RE | Admit: 2023-11-27 | Discharge: 2023-11-27 | Disposition: A | Discharge status: Home / Self Care | Payer: PPO | Source: Ambulatory Visit | Attending: Internal Medicine | Admitting: Internal Medicine

## 2023-11-27 DIAGNOSIS — Z1231 Encounter for screening mammogram for malignant neoplasm of breast: Secondary | ICD-10-CM | POA: Diagnosis not present

## 2023-12-25 DIAGNOSIS — E109 Type 1 diabetes mellitus without complications: Secondary | ICD-10-CM | POA: Diagnosis not present

## 2023-12-25 DIAGNOSIS — E039 Hypothyroidism, unspecified: Secondary | ICD-10-CM | POA: Diagnosis not present

## 2024-01-01 DIAGNOSIS — T466X5A Adverse effect of antihyperlipidemic and antiarteriosclerotic drugs, initial encounter: Secondary | ICD-10-CM | POA: Diagnosis not present

## 2024-01-01 DIAGNOSIS — G720 Drug-induced myopathy: Secondary | ICD-10-CM | POA: Insufficient documentation

## 2024-01-01 DIAGNOSIS — E785 Hyperlipidemia, unspecified: Secondary | ICD-10-CM | POA: Diagnosis not present

## 2024-01-01 DIAGNOSIS — E039 Hypothyroidism, unspecified: Secondary | ICD-10-CM | POA: Diagnosis not present

## 2024-01-01 DIAGNOSIS — Z0001 Encounter for general adult medical examination with abnormal findings: Secondary | ICD-10-CM | POA: Diagnosis not present

## 2024-01-01 DIAGNOSIS — Z0000 Encounter for general adult medical examination without abnormal findings: Secondary | ICD-10-CM | POA: Diagnosis not present

## 2024-01-01 DIAGNOSIS — E109 Type 1 diabetes mellitus without complications: Secondary | ICD-10-CM | POA: Diagnosis not present

## 2024-01-01 DIAGNOSIS — E1069 Type 1 diabetes mellitus with other specified complication: Secondary | ICD-10-CM | POA: Diagnosis not present

## 2024-01-01 DIAGNOSIS — E538 Deficiency of other specified B group vitamins: Secondary | ICD-10-CM | POA: Diagnosis not present

## 2024-01-12 DIAGNOSIS — E109 Type 1 diabetes mellitus without complications: Secondary | ICD-10-CM | POA: Diagnosis not present

## 2024-02-02 DIAGNOSIS — M21621 Bunionette of right foot: Secondary | ICD-10-CM | POA: Diagnosis not present

## 2024-02-02 DIAGNOSIS — E109 Type 1 diabetes mellitus without complications: Secondary | ICD-10-CM | POA: Diagnosis not present

## 2024-02-02 DIAGNOSIS — M2042 Other hammer toe(s) (acquired), left foot: Secondary | ICD-10-CM | POA: Diagnosis not present

## 2024-02-02 DIAGNOSIS — M2041 Other hammer toe(s) (acquired), right foot: Secondary | ICD-10-CM | POA: Diagnosis not present

## 2024-02-23 DIAGNOSIS — D2272 Melanocytic nevi of left lower limb, including hip: Secondary | ICD-10-CM | POA: Diagnosis not present

## 2024-02-23 DIAGNOSIS — D2271 Melanocytic nevi of right lower limb, including hip: Secondary | ICD-10-CM | POA: Diagnosis not present

## 2024-02-23 DIAGNOSIS — D2261 Melanocytic nevi of right upper limb, including shoulder: Secondary | ICD-10-CM | POA: Diagnosis not present

## 2024-02-23 DIAGNOSIS — D225 Melanocytic nevi of trunk: Secondary | ICD-10-CM | POA: Diagnosis not present

## 2024-02-23 DIAGNOSIS — Z08 Encounter for follow-up examination after completed treatment for malignant neoplasm: Secondary | ICD-10-CM | POA: Diagnosis not present

## 2024-02-23 DIAGNOSIS — L821 Other seborrheic keratosis: Secondary | ICD-10-CM | POA: Diagnosis not present

## 2024-02-23 DIAGNOSIS — L570 Actinic keratosis: Secondary | ICD-10-CM | POA: Diagnosis not present

## 2024-02-23 DIAGNOSIS — Z85828 Personal history of other malignant neoplasm of skin: Secondary | ICD-10-CM | POA: Diagnosis not present

## 2024-02-23 DIAGNOSIS — D2262 Melanocytic nevi of left upper limb, including shoulder: Secondary | ICD-10-CM | POA: Diagnosis not present

## 2024-04-06 DIAGNOSIS — E1069 Type 1 diabetes mellitus with other specified complication: Secondary | ICD-10-CM | POA: Diagnosis not present

## 2024-04-06 DIAGNOSIS — E039 Hypothyroidism, unspecified: Secondary | ICD-10-CM | POA: Diagnosis not present

## 2024-04-06 DIAGNOSIS — E785 Hyperlipidemia, unspecified: Secondary | ICD-10-CM | POA: Diagnosis not present

## 2024-04-06 DIAGNOSIS — E109 Type 1 diabetes mellitus without complications: Secondary | ICD-10-CM | POA: Diagnosis not present

## 2024-04-21 DIAGNOSIS — E109 Type 1 diabetes mellitus without complications: Secondary | ICD-10-CM | POA: Diagnosis not present

## 2024-05-06 DIAGNOSIS — G8929 Other chronic pain: Secondary | ICD-10-CM | POA: Diagnosis not present

## 2024-05-06 DIAGNOSIS — Z981 Arthrodesis status: Secondary | ICD-10-CM | POA: Diagnosis not present

## 2024-05-11 DIAGNOSIS — M4726 Other spondylosis with radiculopathy, lumbar region: Secondary | ICD-10-CM | POA: Diagnosis not present

## 2024-05-13 DIAGNOSIS — M4726 Other spondylosis with radiculopathy, lumbar region: Secondary | ICD-10-CM | POA: Diagnosis not present

## 2024-05-17 DIAGNOSIS — M4726 Other spondylosis with radiculopathy, lumbar region: Secondary | ICD-10-CM | POA: Diagnosis not present

## 2024-05-19 DIAGNOSIS — M4726 Other spondylosis with radiculopathy, lumbar region: Secondary | ICD-10-CM | POA: Diagnosis not present

## 2024-05-19 DIAGNOSIS — M2042 Other hammer toe(s) (acquired), left foot: Secondary | ICD-10-CM | POA: Diagnosis not present

## 2024-05-19 DIAGNOSIS — M21621 Bunionette of right foot: Secondary | ICD-10-CM | POA: Diagnosis not present

## 2024-05-19 DIAGNOSIS — B070 Plantar wart: Secondary | ICD-10-CM | POA: Diagnosis not present

## 2024-05-19 DIAGNOSIS — E109 Type 1 diabetes mellitus without complications: Secondary | ICD-10-CM | POA: Diagnosis not present

## 2024-05-19 DIAGNOSIS — M2041 Other hammer toe(s) (acquired), right foot: Secondary | ICD-10-CM | POA: Diagnosis not present

## 2024-05-24 DIAGNOSIS — M4726 Other spondylosis with radiculopathy, lumbar region: Secondary | ICD-10-CM | POA: Diagnosis not present

## 2024-06-06 DIAGNOSIS — M4726 Other spondylosis with radiculopathy, lumbar region: Secondary | ICD-10-CM | POA: Diagnosis not present

## 2024-06-09 DIAGNOSIS — M4726 Other spondylosis with radiculopathy, lumbar region: Secondary | ICD-10-CM | POA: Diagnosis not present

## 2024-06-15 DIAGNOSIS — M4726 Other spondylosis with radiculopathy, lumbar region: Secondary | ICD-10-CM | POA: Diagnosis not present

## 2024-06-17 DIAGNOSIS — M4726 Other spondylosis with radiculopathy, lumbar region: Secondary | ICD-10-CM | POA: Diagnosis not present

## 2024-06-22 DIAGNOSIS — M4726 Other spondylosis with radiculopathy, lumbar region: Secondary | ICD-10-CM | POA: Diagnosis not present

## 2024-06-23 DIAGNOSIS — E039 Hypothyroidism, unspecified: Secondary | ICD-10-CM | POA: Diagnosis not present

## 2024-06-23 DIAGNOSIS — E109 Type 1 diabetes mellitus without complications: Secondary | ICD-10-CM | POA: Diagnosis not present

## 2024-06-27 DIAGNOSIS — M4726 Other spondylosis with radiculopathy, lumbar region: Secondary | ICD-10-CM | POA: Diagnosis not present

## 2024-06-30 DIAGNOSIS — E109 Type 1 diabetes mellitus without complications: Secondary | ICD-10-CM | POA: Diagnosis not present

## 2024-06-30 DIAGNOSIS — T466X5A Adverse effect of antihyperlipidemic and antiarteriosclerotic drugs, initial encounter: Secondary | ICD-10-CM | POA: Diagnosis not present

## 2024-06-30 DIAGNOSIS — E785 Hyperlipidemia, unspecified: Secondary | ICD-10-CM | POA: Diagnosis not present

## 2024-06-30 DIAGNOSIS — M4726 Other spondylosis with radiculopathy, lumbar region: Secondary | ICD-10-CM | POA: Diagnosis not present

## 2024-06-30 DIAGNOSIS — E039 Hypothyroidism, unspecified: Secondary | ICD-10-CM | POA: Diagnosis not present

## 2024-06-30 DIAGNOSIS — E538 Deficiency of other specified B group vitamins: Secondary | ICD-10-CM | POA: Diagnosis not present

## 2024-06-30 DIAGNOSIS — G720 Drug-induced myopathy: Secondary | ICD-10-CM | POA: Diagnosis not present

## 2024-06-30 DIAGNOSIS — E1069 Type 1 diabetes mellitus with other specified complication: Secondary | ICD-10-CM | POA: Diagnosis not present

## 2024-07-04 DIAGNOSIS — M4726 Other spondylosis with radiculopathy, lumbar region: Secondary | ICD-10-CM | POA: Diagnosis not present

## 2024-07-07 DIAGNOSIS — M4726 Other spondylosis with radiculopathy, lumbar region: Secondary | ICD-10-CM | POA: Diagnosis not present

## 2024-07-13 DIAGNOSIS — M4726 Other spondylosis with radiculopathy, lumbar region: Secondary | ICD-10-CM | POA: Diagnosis not present

## 2024-07-20 DIAGNOSIS — M4726 Other spondylosis with radiculopathy, lumbar region: Secondary | ICD-10-CM | POA: Diagnosis not present

## 2024-07-21 DIAGNOSIS — E109 Type 1 diabetes mellitus without complications: Secondary | ICD-10-CM | POA: Diagnosis not present

## 2024-07-25 DIAGNOSIS — E113212 Type 2 diabetes mellitus with mild nonproliferative diabetic retinopathy with macular edema, left eye: Secondary | ICD-10-CM | POA: Diagnosis not present

## 2024-07-25 DIAGNOSIS — H43813 Vitreous degeneration, bilateral: Secondary | ICD-10-CM | POA: Diagnosis not present

## 2024-07-25 DIAGNOSIS — H539 Unspecified visual disturbance: Secondary | ICD-10-CM | POA: Diagnosis not present

## 2024-07-25 DIAGNOSIS — E113211 Type 2 diabetes mellitus with mild nonproliferative diabetic retinopathy with macular edema, right eye: Secondary | ICD-10-CM | POA: Diagnosis not present

## 2024-07-27 DIAGNOSIS — M4726 Other spondylosis with radiculopathy, lumbar region: Secondary | ICD-10-CM | POA: Diagnosis not present

## 2024-08-01 ENCOUNTER — Other Ambulatory Visit: Payer: Self-pay

## 2024-08-01 DIAGNOSIS — H53462 Homonymous bilateral field defects, left side: Secondary | ICD-10-CM

## 2024-08-01 DIAGNOSIS — E113212 Type 2 diabetes mellitus with mild nonproliferative diabetic retinopathy with macular edema, left eye: Secondary | ICD-10-CM | POA: Diagnosis not present

## 2024-08-01 DIAGNOSIS — H539 Unspecified visual disturbance: Secondary | ICD-10-CM | POA: Diagnosis not present

## 2024-08-01 DIAGNOSIS — H43813 Vitreous degeneration, bilateral: Secondary | ICD-10-CM | POA: Diagnosis not present

## 2024-08-01 DIAGNOSIS — E113211 Type 2 diabetes mellitus with mild nonproliferative diabetic retinopathy with macular edema, right eye: Secondary | ICD-10-CM | POA: Diagnosis not present

## 2024-08-03 DIAGNOSIS — M4726 Other spondylosis with radiculopathy, lumbar region: Secondary | ICD-10-CM | POA: Diagnosis not present

## 2024-08-11 ENCOUNTER — Ambulatory Visit
Admission: RE | Admit: 2024-08-11 | Discharge: 2024-08-11 | Disposition: A | Discharge status: Home / Self Care | Source: Ambulatory Visit

## 2024-08-11 DIAGNOSIS — H53462 Homonymous bilateral field defects, left side: Secondary | ICD-10-CM

## 2024-08-11 DIAGNOSIS — I6782 Cerebral ischemia: Secondary | ICD-10-CM | POA: Diagnosis not present

## 2024-08-11 DIAGNOSIS — G319 Degenerative disease of nervous system, unspecified: Secondary | ICD-10-CM | POA: Diagnosis not present

## 2024-08-11 DIAGNOSIS — H539 Unspecified visual disturbance: Secondary | ICD-10-CM | POA: Diagnosis not present

## 2024-08-11 DIAGNOSIS — M4316 Spondylolisthesis, lumbar region: Secondary | ICD-10-CM | POA: Diagnosis not present

## 2024-08-11 MED ORDER — GADOBUTROL 1 MMOL/ML IV SOLN
7.0000 mL | Freq: Once | INTRAVENOUS | Status: AC | PRN
  Administered 2024-08-11: 7 mL via INTRAVENOUS

## 2024-08-12 ENCOUNTER — Other Ambulatory Visit: Payer: Self-pay

## 2024-08-12 ENCOUNTER — Observation Stay
Admission: EM | Admit: 2024-08-12 | Discharge: 2024-08-13 | Disposition: A | Discharge status: Home / Self Care | Source: Ambulatory Visit | Attending: Hospitalist | Admitting: Hospitalist

## 2024-08-12 ENCOUNTER — Observation Stay

## 2024-08-12 ENCOUNTER — Encounter: Payer: Self-pay | Admitting: Emergency Medicine

## 2024-08-12 DIAGNOSIS — E1022 Type 1 diabetes mellitus with diabetic chronic kidney disease: Secondary | ICD-10-CM | POA: Insufficient documentation

## 2024-08-12 DIAGNOSIS — Z79899 Other long term (current) drug therapy: Secondary | ICD-10-CM | POA: Diagnosis not present

## 2024-08-12 DIAGNOSIS — Z8673 Personal history of transient ischemic attack (TIA), and cerebral infarction without residual deficits: Secondary | ICD-10-CM | POA: Diagnosis not present

## 2024-08-12 DIAGNOSIS — Z79890 Hormone replacement therapy (postmenopausal): Secondary | ICD-10-CM | POA: Diagnosis not present

## 2024-08-12 DIAGNOSIS — H53142 Visual discomfort, left eye: Secondary | ICD-10-CM | POA: Diagnosis present

## 2024-08-12 DIAGNOSIS — E039 Hypothyroidism, unspecified: Secondary | ICD-10-CM | POA: Diagnosis not present

## 2024-08-12 DIAGNOSIS — E785 Hyperlipidemia, unspecified: Secondary | ICD-10-CM | POA: Diagnosis not present

## 2024-08-12 DIAGNOSIS — M545 Low back pain, unspecified: Secondary | ICD-10-CM | POA: Insufficient documentation

## 2024-08-12 DIAGNOSIS — I6389 Other cerebral infarction: Secondary | ICD-10-CM | POA: Diagnosis not present

## 2024-08-12 DIAGNOSIS — E139 Other specified diabetes mellitus without complications: Secondary | ICD-10-CM | POA: Diagnosis not present

## 2024-08-12 DIAGNOSIS — Z9641 Presence of insulin pump (external) (internal): Secondary | ICD-10-CM | POA: Diagnosis not present

## 2024-08-12 DIAGNOSIS — J45909 Unspecified asthma, uncomplicated: Secondary | ICD-10-CM | POA: Insufficient documentation

## 2024-08-12 DIAGNOSIS — H539 Unspecified visual disturbance: Secondary | ICD-10-CM | POA: Diagnosis not present

## 2024-08-12 DIAGNOSIS — I700 Atherosclerosis of aorta: Secondary | ICD-10-CM | POA: Insufficient documentation

## 2024-08-12 DIAGNOSIS — Z7902 Long term (current) use of antithrombotics/antiplatelets: Secondary | ICD-10-CM | POA: Insufficient documentation

## 2024-08-12 DIAGNOSIS — Z794 Long term (current) use of insulin: Secondary | ICD-10-CM | POA: Insufficient documentation

## 2024-08-12 DIAGNOSIS — Z96652 Presence of left artificial knee joint: Secondary | ICD-10-CM | POA: Insufficient documentation

## 2024-08-12 DIAGNOSIS — Z8616 Personal history of COVID-19: Secondary | ICD-10-CM | POA: Insufficient documentation

## 2024-08-12 DIAGNOSIS — Z7982 Long term (current) use of aspirin: Secondary | ICD-10-CM | POA: Diagnosis not present

## 2024-08-12 DIAGNOSIS — R9431 Abnormal electrocardiogram [ECG] [EKG]: Secondary | ICD-10-CM | POA: Diagnosis not present

## 2024-08-12 DIAGNOSIS — I6529 Occlusion and stenosis of unspecified carotid artery: Secondary | ICD-10-CM | POA: Diagnosis not present

## 2024-08-12 DIAGNOSIS — N1831 Chronic kidney disease, stage 3a: Secondary | ICD-10-CM | POA: Diagnosis not present

## 2024-08-12 DIAGNOSIS — Z981 Arthrodesis status: Secondary | ICD-10-CM | POA: Insufficient documentation

## 2024-08-12 DIAGNOSIS — I6782 Cerebral ischemia: Secondary | ICD-10-CM | POA: Diagnosis not present

## 2024-08-12 DIAGNOSIS — I639 Cerebral infarction, unspecified: Secondary | ICD-10-CM | POA: Diagnosis not present

## 2024-08-12 LAB — ETHANOL: Alcohol, Ethyl (B): <15 mg/dL (ref <15)

## 2024-08-12 LAB — COMPREHENSIVE METABOLIC PANEL WITH GFR
ALT: 12 U/L (ref 0–44)
AST: 24 U/L (ref 15–41)
Albumin: 4.2 g/dL (ref 3.5–5.0)
Alkaline Phosphatase: 73 U/L (ref 38–126)
Anion gap: 14 (ref 5–15)
BUN: 16 mg/dL (ref 8–23)
CO2: 24 mmol/L (ref 22–32)
Calcium: 9.5 mg/dL (ref 8.9–10.3)
Chloride: 96 mmol/L — ABNORMAL LOW (ref 98–111)
Creatinine, Ser: 0.86 mg/dL (ref 0.44–1.00)
GFR, Estimated: >60 mL/min (ref >60)
Glucose, Bld: 148 mg/dL — ABNORMAL HIGH (ref 70–99)
Potassium: 5.1 mmol/L (ref 3.5–5.1)
Sodium: 134 mmol/L — ABNORMAL LOW (ref 135–145)
Total Bilirubin: 1.7 mg/dL — ABNORMAL HIGH (ref 0.0–1.2)
Total Protein: 7.3 g/dL (ref 6.5–8.1)

## 2024-08-12 LAB — DIFFERENTIAL
Abs Immature Granulocytes: 0.03 K/uL (ref 0.00–0.07)
Basophils Absolute: 0.1 K/uL (ref 0.0–0.1)
Basophils Relative: 1 %
Eosinophils Absolute: 0.1 K/uL (ref 0.0–0.5)
Eosinophils Relative: 1 %
Immature Granulocytes: 0 %
Lymphocytes Relative: 13 %
Lymphs Abs: 1.1 K/uL (ref 0.7–4.0)
Monocytes Absolute: 0.6 K/uL (ref 0.1–1.0)
Monocytes Relative: 7 %
Neutro Abs: 6.6 K/uL (ref 1.7–7.7)
Neutrophils Relative %: 78 %

## 2024-08-12 LAB — APTT: aPTT: 27 s (ref 24–36)

## 2024-08-12 LAB — GLUCOSE, CAPILLARY
Glucose-Capillary: 111 mg/dL — ABNORMAL HIGH (ref 70–99)
Glucose-Capillary: 64 mg/dL — ABNORMAL LOW (ref 70–99)
Glucose-Capillary: 69 mg/dL — ABNORMAL LOW (ref 70–99)

## 2024-08-12 LAB — CBC
HCT: 40.2 % (ref 36.0–46.0)
Hemoglobin: 13.6 g/dL (ref 12.0–15.0)
MCH: 30 pg (ref 26.0–34.0)
MCHC: 33.8 g/dL (ref 30.0–36.0)
MCV: 88.7 fL (ref 80.0–100.0)
Platelets: 243 K/uL (ref 150–400)
RBC: 4.53 MIL/uL (ref 3.87–5.11)
RDW: 13 % (ref 11.5–15.5)
WBC: 8.5 K/uL (ref 4.0–10.5)
nRBC: 0 % (ref 0.0–0.2)

## 2024-08-12 LAB — CBG MONITORING, ED
Glucose-Capillary: 138 mg/dL — ABNORMAL HIGH (ref 70–99)
Glucose-Capillary: 71 mg/dL (ref 70–99)

## 2024-08-12 LAB — PROTIME-INR
INR: 1 (ref 0.8–1.2)
Prothrombin Time: 14 s (ref 11.4–15.2)

## 2024-08-12 MED ORDER — IOHEXOL 350 MG/ML SOLN
75.0000 mL | Freq: Once | INTRAVENOUS | Status: AC | PRN
  Administered 2024-08-12: 75 mL via INTRAVENOUS

## 2024-08-12 MED ORDER — LEVOTHYROXINE SODIUM 88 MCG PO TABS
88.0000 ug | ORAL_TABLET | Freq: Every day | ORAL | Status: DC
  Administered 2024-08-13: 88 ug via ORAL
  Filled 2024-08-12: qty 1

## 2024-08-12 MED ORDER — ASPIRIN 325 MG PO TABS
325.0000 mg | ORAL_TABLET | Freq: Every day | ORAL | Status: DC
  Administered 2024-08-12 – 2024-08-13 (×2): 325 mg via ORAL
  Filled 2024-08-12 (×2): qty 1

## 2024-08-12 MED ORDER — SENNOSIDES-DOCUSATE SODIUM 8.6-50 MG PO TABS: 1.0000 | ORAL_TABLET | Freq: Every evening | ORAL | Status: DC | PRN

## 2024-08-12 MED ORDER — INSULIN PUMP
Freq: Three times a day (TID) | SUBCUTANEOUS | Status: DC
  Filled 2024-08-12: qty 1

## 2024-08-12 MED ORDER — ACETAMINOPHEN 325 MG PO TABS: 650.0000 mg | ORAL_TABLET | ORAL | Status: DC | PRN

## 2024-08-12 MED ORDER — STROKE: EARLY STAGES OF RECOVERY BOOK: Freq: Once | Status: AC

## 2024-08-12 MED ORDER — ACETAMINOPHEN 160 MG/5ML PO SOLN: 650.0000 mg | ORAL | Status: DC | PRN

## 2024-08-12 MED ORDER — ENOXAPARIN SODIUM 40 MG/0.4ML IJ SOSY
40.0000 mg | PREFILLED_SYRINGE | INTRAMUSCULAR | Status: DC
Start: 2024-08-12 — End: 2024-08-13
  Administered 2024-08-12 – 2024-08-13 (×2): 40 mg via SUBCUTANEOUS
  Filled 2024-08-12 (×2): qty 0.4

## 2024-08-12 MED ORDER — SODIUM CHLORIDE 0.9 % IV SOLN: INTRAVENOUS | Status: DC

## 2024-08-12 NOTE — Inpatient Diabetes Management (Signed)
 Inpatient Diabetes Program Recommendations  AACE/ADA: New Consensus Statement on Inpatient Glycemic Control ( Target Ranges:  Prepandial:   less than 140 mg/dL      Peak postprandial:   less than 180 mg/dL (1-2 hours)      Critically ill patients:  140 - 180 mg/dL    Latest Reference Range & Units 08/12/24 12:33 08/12/24 14:48  Glucose-Capillary 70 - 99 mg/dL 861 (H) 71    Latest Reference Range & Units 08/12/24 12:28  Glucose 70 - 99 mg/dL 851 (H)    Review of Glycemic Control  Diabetes history: DM1, LADA Outpatient Diabetes medications: T-Slim insulin  pump with Novolog  insulin  and Dexcom G7 CGM sensor Current orders for Inpatient glycemic control: insulin  Pump AC&HS and 2am  Inpatient Diabetes Program Recommendations:    Insulin  Pump: Agree with patient using her insulin  pump as long as she is alert, oriented, and able to manage it herself.  If patient had to be taking off for some reason, patient would need to have basal, correction, and meal coverage insulin  ordered.   NOTE: Patient currently in ED being admitted with subacute CVA (having left sided visual changes for 1.5 weeks). Patient has hx of DM1 LADA and managed with insulin  pump.  Per chart review, patient sees Dr. Cherilyn and was last seen on 04/06/24; per note: She is on Novolog  in the T slim pump:  Basal rates Midnight = 0.45 6 AM = 0.625 9 AM = 0.625 2 PM = 0.625 6 PM = 0.45 TDD basal: 12.9 units  Bolus settings I/C: 9 at midnight, 12 at 2 PM ISF: 52 at midnight, 55 at 9 AM Target Glucose: 110 at midnight, 130 at 6 PM Active insulin  time: 5 hours   Sent chat message to [place], RN to be sure patient has insulin  pump on and that patient is alert, oriented, and able to manage the pump herself.  NURSING: Once insulin  pump order set is ordered please print off the Patient insulin  pump contract and flow sheet. The insulin  pump contract should be signed by the patient and then placed in the chart. The patient  insulin  pump flow sheet will be completed by the patient at the bedside and the RN caring for the patient will use the patient's flow sheet to document in the [facility]. RN will need to complete the Nursing Insulin  Pump Flowsheet at least once a shift. Patient will need to keep extra insulin  pump supplies at the bedside at all times.   Thanks, Earnie Gainer, RN, MSN, CDCES Diabetes Coordinator Inpatient Diabetes Program [phone] (Team Pager from 8am to 5pm)

## 2024-08-12 NOTE — ED Provider Notes (Signed)
 [facility] Provider Note    Event Date/Time   First MD Initiated Contact with Patient 08/12/24 1414     (approximate)   History   Eye Problem and abnormal MRI   HPI  Elizabeth [name] is a 83 y.o. female who presents to the ED for evaluation of Eye Problem and abnormal MRI   I review a PCP visit from last month.  History of type 1 diabetes, hypothyroidism, HLD  Patient reports 2 weeks of left lateral visual field changes.  Had an outpatient MRI performed after she saw ophthalmology which showed signs of subacute stroke so she was directed to the ED.  Denies any symptoms such as numbness or weakness to the extremities, speech changes or gait changes.   Physical Exam   Triage Vital Signs: ED Triage Vitals  Encounter Vitals Group     BP 08/12/24 1225 (!) 153/93     Girls Systolic BP Percentile --      Girls Diastolic BP Percentile --      Boys Systolic BP Percentile --      Boys Diastolic BP Percentile --      Pulse Rate 08/12/24 1225 99     Resp 08/12/24 1225 18     Temp 08/12/24 1225 98.7 F (37.1 C)     Temp Source 08/12/24 1225 Oral     SpO2 08/12/24 1225 97 %     Weight 08/12/24 1226 154 lb 15.7 oz (70.3 kg)     Height 08/12/24 1226 5' 3 (1.6 m)     Head Circumference --      Peak Flow --      Pain Score 08/12/24 1225 0     Pain Loc --      Pain Education --      Exclude from Growth Chart --     Most recent vital signs: Vitals:   08/12/24 1225 08/12/24 1439  BP: (!) 153/93 (!) 163/75  Pulse: 99   Resp: 18 16  Temp: 98.7 F (37.1 C)   SpO2: 97%     General: Awake, no distress.  CV:  Good peripheral perfusion.  Resp:  Normal effort.  Abd:  No distention.  MSK:  No deformity noted.  Neuro:  No focal deficits appreciated beyond left-sided visual field deficits Other:     ED Results / Procedures / Treatments   Labs (all labs ordered are listed, but only abnormal results are displayed) Labs Reviewed  COMPREHENSIVE  METABOLIC PANEL WITH GFR - Abnormal; Notable for the following components:      Result Value   Sodium 134 (*)    Chloride 96 (*)    Glucose, Bld 148 (*)    Total Bilirubin 1.7 (*)    All other components within normal limits  CBG MONITORING, ED - Abnormal; Notable for the following components:   Glucose-Capillary 138 (*)    All other components within normal limits  PROTIME-INR  APTT  CBC  DIFFERENTIAL  ETHANOL  CBG MONITORING, ED    EKG Sinus rhythm with a rate of 59 bpm.  Normal axis and intervals without clear signs of acute ischemia.  RADIOLOGY Outpatient MRI reviewed with signs of subacute stroke  Official radiology report(s): No results found.  PROCEDURES and INTERVENTIONS:  .1-3 Lead EKG Interpretation  Performed by: Claudene Rover, MD Authorized by: Claudene Rover, MD     Interpretation: normal     ECG rate:  90   ECG rate assessment: normal  Rhythm: sinus rhythm     Ectopy: none     Conduction: normal   .Critical Care  Performed by: Claudene Rover, MD Authorized by: Claudene Rover, MD   Critical care provider statement:    Critical care time (minutes):  30   Critical care time was exclusive of:  Separately billable procedures and treating other patients   Critical care was necessary to treat or prevent imminent or life-threatening deterioration of the following conditions:  CNS failure or compromise   Critical care was time spent personally by me on the following activities:  Development of treatment plan with patient or surrogate, discussions with consultants, evaluation of patient's response to treatment, examination of patient, ordering and review of laboratory studies, ordering and review of radiographic studies, ordering and performing treatments and interventions, pulse oximetry, re-evaluation of patient's condition and review of old charts   Medications - No data to display   IMPRESSION / MDM / ASSESSMENT AND PLAN / ED COURSE  I reviewed the triage  vital signs and the nursing notes.  Differential diagnosis includes, but is not limited to, stroke, optic neuritis, complex migraine  {Patient presents with symptoms of an acute illness or injury that is potentially life-threatening.  Patient presents with symptoms of a subacute stroke requiring medical admission.  Outside of any interventional window at this point.  Normal metabolic panel and CBC.  Consult medicine for admission.  Clinical Course as of 08/12/24 1452  Fri Aug 12, 2024  1450 I consulted medicine who agrees to admit [DS]    Clinical Course User Index [DS] Claudene Rover, MD     FINAL CLINICAL IMPRESSION(S) / ED DIAGNOSES   Final diagnoses:  Cerebrovascular accident (CVA), unspecified mechanism (HCC)     Rx / DC Orders   ED Discharge Orders     None        Note:  This document was prepared using Dragon voice recognition software and may include unintentional dictation errors.   Claudene Rover, MD 08/12/24 [phone]

## 2024-08-12 NOTE — H&P (Signed)
 History and Physical   Elizabeth [name] FMW:[id number] DOB: 1941/03/13 DOA: 08/12/2024  PCP: Rudolpho Norleen BIRCH, MD   Patient coming from: Home  Chief Complaint: Stroke/abnormal MRI  HPI: Elizabeth [name] is a 83 y.o. female with medical history significant of hyperlipidemia, autoimmune diabetes, CKD 3A, hypothyroidism, status post C-spine fusion presenting with visual changes found to have stroke on outpatient MRI.  Patient noted visual changes in her left eye starting 1.5 weeks ago.  Described as abnormal shapes and colors as well as decreased vision in her left visual field of her left eye. Saw ophthalmology who ordered outpatient MRI which was done yesterday.  MRI resulted showing subacute infarct and patient was sent to the ED for stroke workup.  Patient denies fevers, chills, chest pain, shortness of breath, abdominal pain, constipation, diarrhea, nausea, vomiting.  ED Course: Vital signs in the ED notable for blood pressure in the 150s systolic.  Lab workup included CMP with sodium 134, chloride 96, glucose 148, T. bili 1.7.  CBC within normal limits.  PT, PTT, INR within normal limits.  Ethanol level pending.  Yesterday MRI orbits showed no acute abnormality.  MRI brain showed positive subacute posterior right frontal infarct.  No initial interventions in the ED.  Review of Systems: As per HPI otherwise all other systems reviewed and are negative.  Past Medical History:  Diagnosis Date   Acute bacterial sinusitis 08/27/2022   Acute hypoxemic respiratory failure (HCC) 08/16/2022   Anemia    Arthritis    Asthma    with cough/cold but nothing now   Chronic kidney disease    due to type 1 diabetes   COVID 08/14/2022   Diabetes mellitus without complication (HCC) 2019   on insulin  pump. VERY BRITTLE. per patient, it can drop 140 points in an hour   GERD (gastroesophageal reflux disease)    H/O   HCAP (healthcare-associated pneumonia) 08/27/2022   Heart murmur    not  followed by anyone   Hx of cervical spine surgery    Hyperlipidemia    Hypothyroidism    Insulin  pump in place    PONV (postoperative nausea and vomiting)    NO PROBLEMS SINCE USING ZOFRAN  INTRAOP   Severe sepsis (HCC) 08/27/2022    Past Surgical History:  Procedure Laterality Date   ABDOMINAL HYSTERECTOMY  1972   BLADDER SURGERY  2003   leaky bladder   BREAST BIOPSY Left 2015   neg   CATARACT EXTRACTION W/PHACO Right 02/21/2016   Procedure: CATARACT EXTRACTION PHACO AND INTRAOCULAR LENS PLACEMENT (IOC);  Surgeon: Dene Etienne, MD;  Location: [facility];  Service: Ophthalmology;  Laterality: Right;  Lot# [id number] HUS: 00:50.6AP%:17.0CDE: 8.56   CATARACT EXTRACTION W/PHACO Left 03/26/2016   Procedure: CATARACT EXTRACTION PHACO AND INTRAOCULAR LENS PLACEMENT (IOC) left eye;  Surgeon: Dene Etienne, MD;  Location: [facility];  Service: Ophthalmology;  Laterality: Left;  DIABETIC - insulin  pump SYMFONY TORIC LENS   CHOLECYSTECTOMY  1995   COLONOSCOPY WITH PROPOFOL  N/A 01/28/2016   Procedure: COLONOSCOPY WITH PROPOFOL ;  Surgeon: Deward CINDERELLA Piedmont, MD;  Location: [facility];  Service: Gastroenterology;  Laterality: N/A;   KNEE ARTHROPLASTY Left 02/03/2018   Procedure: COMPUTER ASSISTED TOTAL KNEE ARTHROPLASTY;  Surgeon: Mardee Lynwood SQUIBB, MD;  Location: [facility];  Service: Orthopedics;  Laterality: Left;   KNEE ARTHROSCOPY Left 09/17/2015   Procedure: ARTHROSCOPY KNEE,partial medial menisectomy, chondroplasty medial patella femoral.;  Surgeon: Lynwood SQUIBB Mardee, MD;  Location: [facility];  Service: Orthopedics;  Laterality: Left;  mallet toe Bilateral 2013, 2018   2nd toes   MENISECTOMY Right    2011   NECK SURGERY  2010   2 discs.  has plate and screws in place   SEPTOPLASTY  1997    Social History  reports that she has never smoked. She has never used smokeless tobacco. She reports that she does not drink alcohol  and does not use drugs.  Allergies  Allergen Reactions    Neosporin [Neomycin -Bacitracin Zn-Polymyx] Other (See Comments)    Skin rash or redness   Ceclor [Cefaclor] Other (See Comments)    Unsure of reaction type   Other     Other reaction(s): Other (See Comments) sulfasaline-Does not remember   Polysorbate Other (See Comments)   Relafen [Nabumetone] Other (See Comments)    Unsure of reaction type   Sulfasalazine Other (See Comments)    Unsure of reaction type   Amoxicillin Nausea And Vomiting    Has patient had a PCN reaction causing immediate rash, facial/tongue/throat swelling, SOB or lightheadedness with hypotension: No Has patient had a PCN reaction causing severe rash involving mucus membranes or skin necrosis:No Has patient had a PCN reaction that required hospitalization: No Has patient had a PCN reaction occurring within the last 10 years: No If all of the above answers are NO, then may proceed with Cephalosporin use.    Band-Aid Plus Antibiotic [Bacitracin-Polymyxin B] Other (See Comments)    ADHESIVE, NOT ANTIBIOTIC Can use paper tape PULLS SKIN OFF Other reaction(s): Other (See Comments) PULLS SKIN OFF PULLS SKIN OFF   Clindamycin  Rash   Niacin     Other reaction(s): Other (See Comments) flushing   Niaspan [Niacin Er (Antihyperlipidemic)] Other (See Comments)    FLUSHING   Penicillin V Potassium Other (See Comments)    Has patient had a PCN reaction causing immediate rash, facial/tongue/throat swelling, SOB or lightheadedness with hypotension: Unknown Has patient had a PCN reaction causing severe rash involving mucus membranes or skin necrosis: Unknown Has patient had a PCN reaction that required hospitalization: No Has patient had a PCN reaction occurring within the last 10 years: No If all of the above answers are NO, then may proceed with Cephalosporin use.    Penicillins Nausea And Vomiting    Happened so long ago and patient is not sure of reaction   Statins Other (See Comments)    Other reaction(s): Other  (See Comments) arthralgia ARTHRALGIA arthralgia    Family History  Problem Relation Age of Onset   Breast cancer Maternal Aunt 65   Breast cancer Paternal Aunt 46  Reviewed on admission  Prior to Admission medications   Medication Sig Start Date End Date Taking? Authorizing Provider  Biotin  10000 MCG TABS Take 10,000 mcg by mouth daily at 12 noon.    [provider]  Calcium  Carb-Cholecalciferol  (CALCIUM  600+D3 PO) Take 2 tablets by mouth daily.    [provider]  glucagon  1 MG injection Inject 1 mg into the muscle daily as needed (for low blood sugars). Reported on 02/21/2016    [provider]  glucose 5 g chewable tablet Chew 15 g by mouth 3 (three) times daily as needed for low blood sugar.     [provider]  Insulin  Human (INSULIN  PUMP) SOLN Inject into the skin. NOVOLOG  100 UNIT/ML injection    [provider]  levothyroxine  (SYNTHROID ) 175 MCG tablet Take 87.5 mcg by mouth every morning. 05/28/22   [provider]  lidocaine  (XYLOCAINE ) 2 % solution Use  as directed 5 mLs in the mouth or throat 4 (four) times daily as needed for pain. 08/21/22   [provider]  Melatonin 10 MG TABS Take 10 mg by mouth at bedtime.    [provider]  rosuvastatin  (CRESTOR ) 10 MG tablet Take 10 mg by mouth at bedtime.  04/12/14   [provider]  vitamin B-12 (CYANOCOBALAMIN ) 1000 MCG tablet Take 1,000 mcg by mouth daily at 12 noon.    [provider]    Physical Exam: Vitals:   08/12/24 1225 08/12/24 1226  BP: (!) 153/93   Pulse: 99   Resp: 18   Temp: 98.7 F (37.1 C)   TempSrc: Oral   SpO2: 97%   Weight:  70.3 kg  Height:  5' 3 (1.6 m)    Physical Exam Constitutional:      General: She is not in acute distress.    Appearance: Normal appearance.  HENT:     Head: Normocephalic and atraumatic.     Mouth/Throat:     Mouth: Mucous membranes are moist.     Pharynx: Oropharynx is clear.  Eyes:      Extraocular Movements: Extraocular movements intact.     Pupils: Pupils are equal, round, and reactive to light.  Cardiovascular:     Rate and Rhythm: Normal rate and regular rhythm.     Pulses: Normal pulses.     Heart sounds: Normal heart sounds.  Pulmonary:     Effort: Pulmonary effort is normal. No respiratory distress.     Breath sounds: Normal breath sounds.  Abdominal:     General: Bowel sounds are normal. There is no distension.     Palpations: Abdomen is soft.     Tenderness: There is no abdominal tenderness.  Musculoskeletal:        General: No swelling or deformity.  Skin:    General: Skin is warm and dry.  Neurological:     Comments: Mental Status: Patient is awake, alert, oriented x3  No signs of aphasia or neglect Cranial Nerves: II: Pupils equal, round, and reactive to light. Visual changes in the left visual field of left eye. III,IV, VI: EOMI without ptosis or diploplia.  V: Facial sensation is symmetric to light touch and  Temperature. VII: Facial movement is symmetric.  VIII: hearing is intact to voice X: Uvula elevates symmetrically XI: Shoulder shrug is symmetric. XII: tongue is midline without atrophy or fasciculations.  Motor: Good effort thorughout, at Least 5/5 bilateral UE, 5/5 bilateral lower extremitiy  Sensory: Sensation is grossly intact bilateral UEs & LEs Cerebellar: Finger-Nose intact bilat    Labs on Admission: I have personally reviewed following labs and imaging studies  CBC: Recent Labs  Lab 08/12/24 1228  WBC 8.5  NEUTROABS 6.6  HGB 13.6  HCT 40.2  MCV 88.7  PLT 243    Basic Metabolic Panel: Recent Labs  Lab 08/12/24 1228  NA 134*  K 5.1  CL 96*  CO2 24  GLUCOSE 148*  BUN 16  CREATININE 0.86  CALCIUM  9.5    GFR: Estimated Creatinine Clearance: 45.8 mL/min (by C-G formula based on SCr of 0.86 mg/dL).  Liver Function Tests: Recent Labs  Lab 08/12/24 1228  AST 24  ALT 12  ALKPHOS 73  BILITOT 1.7*  PROT 7.3   ALBUMIN 4.2    Urine analysis:    Component Value Date/Time   COLORURINE STRAW (A) 03/22/2023 2320   APPEARANCEUR CLEAR (A) 03/22/2023 2320   APPEARANCEUR Clear 03/24/2013 2249  LABSPEC 1.016 03/22/2023 2320   LABSPEC 1.008 03/24/2013 2249   PHURINE 5.0 03/22/2023 2320   GLUCOSEU >=500 (A) 03/22/2023 2320   GLUCOSEU Negative 03/24/2013 2249   HGBUR NEGATIVE 03/22/2023 2320   BILIRUBINUR NEGATIVE 03/22/2023 2320   BILIRUBINUR Negative 03/24/2013 2249   KETONESUR 5 (A) 03/22/2023 2320   PROTEINUR NEGATIVE 03/22/2023 2320   NITRITE NEGATIVE 03/22/2023 2320   LEUKOCYTESUR NEGATIVE 03/22/2023 2320   LEUKOCYTESUR Trace 03/24/2013 2249    Radiological Exams on Admission: MR ORBITS W WO CONTRAST Result Date: 08/11/2024 CLINICAL DATA:  Provided history: Left homonymous hemianopsia. Additional history provided by the scanning technologist: Left eye vision changes and discomfort. EXAM: MRI HEAD AND ORBITS WITHOUT AND WITH CONTRAST TECHNIQUE: Multiplanar, multiecho pulse sequences of the brain and surrounding structures were obtained without and with intravenous contrast. Multiplanar, multiecho pulse sequences of the orbits and surrounding structures were obtained including fat saturation techniques, before and after intravenous contrast administration. CONTRAST:  7mL GADAVIST  GADOBUTROL  1 MMOL/ML IV SOLN COMPARISON:  Brain MRI 03/25/2013. FINDINGS: MRI HEAD FINDINGS Brain: Mild generalized cerebral atrophy. 4 mm focus of diffusion-weighted and T2 FLAIR hyperintense signal abnormality within the posterior right frontal lobe white matter. There is corresponding T2 shine through at this site on the [facility] map, and this likely reflects a small late subacute infarct. Multifocal T2 FLAIR hyperintense signal abnormality elsewhere within the cerebral white matter and pons, nonspecific but compatible with moderate chronic small vessel ischemic disease. Prominent perivascular space within the left lentiform  nucleus. Chronic microhemorrhage at the left insula. No cortical encephalomalacia is identified. There is no acute infarct. No evidence of an intracranial mass. No extra-axial fluid collection. No midline shift. No pathologic intracranial enhancement identified. Vascular: Maintained flow voids within the proximal large arterial vessels. Skull and upper cervical spine: No focal worrisome marrow lesion. Incompletely assessed cervical spondylosis. Susceptibility artifact arising from ACDF hardware. MRI ORBITS FINDINGS The coronal T2 sequence is mildly motion degraded. Within this limitation, findings are as follows. Orbits: Prior bilateral ocular lens replacement. The globes are normal in size and contour. The extraocular muscles, optic nerve sheath complexes and lacrimal glands are symmetric and unremarkable. No orbital mass or pathologic orbital enhancement. Visualized sinuses: Postsurgical appearance of the paranasal sinuses. Mild mucosal thickening within the right maxillary sinus. Soft tissues: Unremarkable appearance of the periorbital and visible facial soft tissues. MRI brain impression #1 will be called to the ordering clinician or representative by the Radiologist Assistant, and communication documented in the PACS or [employer]. IMPRESSION: MRI brain: 1. 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 2. Background chronic small vessel ischemic changes within the cerebral white matter and pons, progressed from the prior MRI of 03/25/2013 and overall moderate in severity. 3. Mild generalized cerebral atrophy. MRI orbits: 1. Mildly motion degraded examination. 2. Prior bilateral ocular lens replacement. 3. Otherwise unremarkable MRI appearance of the orbits. 4. Mild right maxillary sinus mucosal thickening. Electronically Signed   By: Rockey Childs D.O.   On: 08/11/2024 20:16   MR BRAIN W WO CONTRAST Result Date: 08/11/2024 CLINICAL DATA:  Provided history:  Left homonymous hemianopsia. Additional history provided by the scanning technologist: Left eye vision changes and discomfort. EXAM: MRI HEAD AND ORBITS WITHOUT AND WITH CONTRAST TECHNIQUE: Multiplanar, multiecho pulse sequences of the brain and surrounding structures were obtained without and with intravenous contrast. Multiplanar, multiecho pulse sequences of the orbits and surrounding structures were obtained including fat saturation techniques, before and after  intravenous contrast administration. CONTRAST:  7mL GADAVIST  GADOBUTROL  1 MMOL/ML IV SOLN COMPARISON:  Brain MRI 03/25/2013. FINDINGS: MRI HEAD FINDINGS Brain: Mild generalized cerebral atrophy. 4 mm focus of diffusion-weighted and T2 FLAIR hyperintense signal abnormality within the posterior right frontal lobe white matter. There is corresponding T2 shine through at this site on the [facility] map, and this likely reflects a small late subacute infarct. Multifocal T2 FLAIR hyperintense signal abnormality elsewhere within the cerebral white matter and pons, nonspecific but compatible with moderate chronic small vessel ischemic disease. Prominent perivascular space within the left lentiform nucleus. Chronic microhemorrhage at the left insula. No cortical encephalomalacia is identified. There is no acute infarct. No evidence of an intracranial mass. No extra-axial fluid collection. No midline shift. No pathologic intracranial enhancement identified. Vascular: Maintained flow voids within the proximal large arterial vessels. Skull and upper cervical spine: No focal worrisome marrow lesion. Incompletely assessed cervical spondylosis. Susceptibility artifact arising from ACDF hardware. MRI ORBITS FINDINGS The coronal T2 sequence is mildly motion degraded. Within this limitation, findings are as follows. Orbits: Prior bilateral ocular lens replacement. The globes are normal in size and contour. The extraocular muscles, optic nerve sheath complexes and lacrimal glands  are symmetric and unremarkable. No orbital mass or pathologic orbital enhancement. Visualized sinuses: Postsurgical appearance of the paranasal sinuses. Mild mucosal thickening within the right maxillary sinus. Soft tissues: Unremarkable appearance of the periorbital and visible facial soft tissues. MRI brain impression #1 will be called to the ordering clinician or representative by the Radiologist Assistant, and communication documented in the PACS or [employer]. IMPRESSION: MRI brain: 1. 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 2. Background chronic small vessel ischemic changes within the cerebral white matter and pons, progressed from the prior MRI of 03/25/2013 and overall moderate in severity. 3. Mild generalized cerebral atrophy. MRI orbits: 1. Mildly motion degraded examination. 2. Prior bilateral ocular lens replacement. 3. Otherwise unremarkable MRI appearance of the orbits. 4. Mild right maxillary sinus mucosal thickening. Electronically Signed   By: Rockey Childs D.O.   On: 08/11/2024 20:16   EKG: Independently reviewed. Sinus rhythm at 59 bpm.  Nonspecific T wave changes.  Assessment/Plan Active Problems:   LADA (latent autoimmune diabetes in adults), managed as type 1 (HCC)   Insulin  pump in place   Hyperlipidemia, unspecified   Hypothyroidism   CKD stage 3a, GFR 45-59 ml/min (HCC)   CVA > Presenting after outpatient MRI showed subacute CVA. > Patient having left-sided visual changes for the past 1.5 weeks.  Saw ophthalmology who ordered MRI.  Positive for CVA. > No other deficits. - Neurology consult - Out of the window for permissive hypertension  - ASA 325 mg / 81 mg daily  - Statin pending lipid result - Echocardiogram  - CTA head & neck  - A1C  - Lipid panel  - Tele monitoring  - SLP eval - PT/OT  Hyperlipidemia - Lipid panel as above  LADA > Manages type I, has pump. - Continue home insulin  pump  CKD 3A -  Creatinine stable - Trend renal function and electrolytes  Hypothyroidism - Continue Synthroid    DVT prophylaxis: Lovenox  Code Status:   Full Family Communication:  Updated at bedside  Disposition Plan:   Patient is from:  Home  Anticipated DC to:  Home  Anticipated DC date:  To 2 days  Anticipated DC barriers: None  Consults called:  Neurology Admission status:  Observation, telemetry  Severity of Illness:  The appropriate patient status for this patient is OBSERVATION. Observation status is judged to be reasonable and necessary in order to provide the required intensity of service to ensure the patient's safety. The patient's presenting symptoms, physical exam findings, and initial radiographic and laboratory data in the context of their medical condition is felt to place them at decreased risk for further clinical deterioration. Furthermore, it is anticipated that the patient will be medically stable for discharge from the hospital within 2 midnights of admission.    Marsa KATHEE Scurry MD [facility]  How to contact the [facility] Attending or Consulting provider 7A - 7P or covering provider during after hours 7P -7A, for this patient?   Check the care team in [facility] and look for a) attending/consulting [facility] provider listed and b) the [facility] team listed Log into [email or web address] and use [place]'s universal password to access. If you do not have the password, please contact the hospital operator. Locate the [facility] provider you are looking for under [facility] and page to a number that you can be directly reached. If you still have difficulty reaching the provider, please page the [facility] (Director on Call) for the Hospitalists listed on amion for assistance.  08/12/2024, 2:43 PM

## 2024-08-12 NOTE — ED Notes (Signed)
 CCMD called and pt placed on monitor

## 2024-08-12 NOTE — ED Notes (Signed)
 This RN messaged Marsa Scurry, MD to notify that pt had passed swallow screen & request a diet order.

## 2024-08-12 NOTE — ED Triage Notes (Signed)
 Pt reports left eye vision changes about 2.5 weeks ago. Reports her peripheral vision was showing green colors. Pt had MRI yesterday and showed acute infarct in frontal lobe. Pt denies weakness.

## 2024-08-13 ENCOUNTER — Observation Stay: Admit: 2024-08-13 | Discharge: 2024-08-13 | Disposition: A | Attending: Internal Medicine

## 2024-08-13 DIAGNOSIS — I6389 Other cerebral infarction: Secondary | ICD-10-CM | POA: Diagnosis not present

## 2024-08-13 DIAGNOSIS — I639 Cerebral infarction, unspecified: Secondary | ICD-10-CM | POA: Diagnosis not present

## 2024-08-13 LAB — CBC WITH DIFFERENTIAL/PLATELET
Abs Immature Granulocytes: 0.01 K/uL (ref 0.00–0.07)
Basophils Absolute: 0.1 K/uL (ref 0.0–0.1)
Basophils Relative: 1 %
Eosinophils Absolute: 0.2 K/uL (ref 0.0–0.5)
Eosinophils Relative: 3 %
HCT: 40.5 % (ref 36.0–46.0)
Hemoglobin: 13.7 g/dL (ref 12.0–15.0)
Immature Granulocytes: 0 %
Lymphocytes Relative: 17 %
Lymphs Abs: 1.1 K/uL (ref 0.7–4.0)
MCH: 29.7 pg (ref 26.0–34.0)
MCHC: 33.8 g/dL (ref 30.0–36.0)
MCV: 87.9 fL (ref 80.0–100.0)
Monocytes Absolute: 0.7 K/uL (ref 0.1–1.0)
Monocytes Relative: 10 %
Neutro Abs: 4.8 K/uL (ref 1.7–7.7)
Neutrophils Relative %: 69 %
Platelets: 222 K/uL (ref 150–400)
RBC: 4.61 MIL/uL (ref 3.87–5.11)
RDW: 13.2 % (ref 11.5–15.5)
WBC: 6.9 K/uL (ref 4.0–10.5)
nRBC: 0 % (ref 0.0–0.2)

## 2024-08-13 LAB — GLUCOSE, CAPILLARY
Glucose-Capillary: 115 mg/dL — ABNORMAL HIGH (ref 70–99)
Glucose-Capillary: 106 mg/dL — ABNORMAL HIGH (ref 70–99)
Glucose-Capillary: 97 mg/dL (ref 70–99)

## 2024-08-13 LAB — COMPREHENSIVE METABOLIC PANEL WITH GFR
ALT: 11 U/L (ref 0–44)
AST: 25 U/L (ref 15–41)
Albumin: 3.6 g/dL (ref 3.5–5.0)
Alkaline Phosphatase: 76 U/L (ref 38–126)
Anion gap: 9 (ref 5–15)
BUN: 13 mg/dL (ref 8–23)
CO2: 26 mmol/L (ref 22–32)
Calcium: 9.1 mg/dL (ref 8.9–10.3)
Chloride: 101 mmol/L (ref 98–111)
Creatinine, Ser: 0.86 mg/dL (ref 0.44–1.00)
GFR, Estimated: >60 mL/min (ref >60)
Glucose, Bld: 116 mg/dL — ABNORMAL HIGH (ref 70–99)
Potassium: 4.5 mmol/L (ref 3.5–5.1)
Sodium: 136 mmol/L (ref 135–145)
Total Bilirubin: 1.6 mg/dL — ABNORMAL HIGH (ref 0.0–1.2)
Total Protein: 7.2 g/dL (ref 6.5–8.1)

## 2024-08-13 LAB — ECHOCARDIOGRAM COMPLETE
AR max vel: 1.42 cm2
AV Area VTI: 1.53 cm2
AV Area mean vel: 1.33 cm2
AV Mean grad: 7 mmHg
AV Peak grad: 14.1 mmHg
Ao pk vel: 1.88 m/s
Area-P 1/2: 2.17 cm2
Height: 63 in
S' Lateral: 2.55 cm
Single Plane A2C EF: 82.8 %
Weight: 2479.73 [oz_av]

## 2024-08-13 LAB — LIPID PANEL
Cholesterol: 299 mg/dL — ABNORMAL HIGH (ref 0–200)
HDL: 78 mg/dL (ref >40)
LDL Cholesterol: 208 mg/dL — ABNORMAL HIGH (ref 0–99)
Total CHOL/HDL Ratio: 3.8 ratio
Triglycerides: 66 mg/dL (ref <150)
VLDL: 13 mg/dL (ref 0–40)

## 2024-08-13 LAB — HEMOGLOBIN A1C
Hgb A1c MFr Bld: 6 % — ABNORMAL HIGH (ref 4.8–5.6)
Mean Plasma Glucose: 125.5 mg/dL

## 2024-08-13 MED ORDER — CLOPIDOGREL BISULFATE 75 MG PO TABS
75.0000 mg | ORAL_TABLET | Freq: Every day | ORAL | Status: DC
  Administered 2024-08-13: 75 mg via ORAL
  Filled 2024-08-13: qty 1

## 2024-08-13 MED ORDER — ASPIRIN 81 MG PO TBEC: 81.0000 mg | DELAYED_RELEASE_TABLET | Freq: Every day | ORAL | 2 refills | Status: AC

## 2024-08-13 MED ORDER — ATORVASTATIN CALCIUM 20 MG PO TABS
80.0000 mg | ORAL_TABLET | Freq: Every day | ORAL | Status: DC
  Administered 2024-08-13: 80 mg via ORAL
  Filled 2024-08-13: qty 4

## 2024-08-13 MED ORDER — CLOPIDOGREL BISULFATE 75 MG PO TABS: 75.0000 mg | ORAL_TABLET | Freq: Every day | ORAL | 2 refills | Status: AC

## 2024-08-13 MED ORDER — SIMVASTATIN 80 MG PO TABS: 80.0000 mg | ORAL_TABLET | Freq: Every evening | ORAL | 2 refills | Status: AC

## 2024-08-13 MED ORDER — PERFLUTREN LIPID MICROSPHERE
1.0000 mL | INTRAVENOUS | Status: AC | PRN
  Administered 2024-08-13: 3.5 mL via INTRAVENOUS

## 2024-08-13 NOTE — Evaluation (Signed)
 Physical Therapy Evaluation Patient Details Name: Elizabeth [name] MRN: [id number] DOB: 11-11-1941 Today's Date: 08/13/2024  History of Present Illness  Elizabeth [name] is a 83 y.o. female with medical history significant of hyperlipidemia, autoimmune diabetes, CKD 3A, hypothyroidism, status post C-spine fusion presenting with visual changes at time of admission on 09/26.  Clinical Impression  Patient seen for initial PT evaluation due to decline in functional status . Patient is A&O x 4. Baseline mobility reported as independent, currently requiring no assistance for transfers and ambulation able to ambulate 250' in hallway. Pt lives with her spouse in a 1 story home with 3 steps to enter. Currently pt reporting symptoms have resolved and is at baseline level of function. Pt states that she continues to have some blurriness on L side. Pt has no PT needs at this time. Pt safe to d/c to home with no further PT needs.       If plan is discharge home, recommend the following: A little help with walking and/or transfers   Can travel by private vehicle    Yes    Equipment Recommendations  None  Recommendations for Other Services       Functional Status Assessment Patient has had a recent decline in their functional status and demonstrates the ability to make significant improvements in function in a reasonable and predictable amount of time.     Precautions / Restrictions Precautions Precautions: None      Mobility  Bed Mobility Overal bed mobility: Independent                  Transfers Overall transfer level: Independent Equipment used: None               General transfer comment: Amb within room with no DME use, good safety awareness throughout    Ambulation/Gait Ambulation/Gait assistance: Independent Gait Distance (Feet): 300 Feet   Gait Pattern/deviations: WFL(Within Functional Limits)          Stairs            Wheelchair Mobility      Tilt Bed    Modified Rankin (Stroke Patients Only)       Balance Overall balance assessment: Independent                                           Pertinent Vitals/Pain Pain Assessment Pain Assessment: No/denies pain    Home Living Family/patient expects to be discharged to:: Private residence Living Arrangements: Spouse/significant other;Other relatives Available Help at Discharge: Family;Available 24 hours/day Type of Home: House Home Access: Stairs to enter Entrance Stairs-Rails: Lawyer of Steps: 3   Home Layout: One level;Laundry or work area in [employer] Equipment: Agricultural consultant (2 wheels);Cane - single point      Prior Function Prior Level of Function : Independent/Modified Independent             Mobility Comments: was outside prunning srubs yesterday; just finished up 3 months of PT ADLs Comments: I with ADLs/IADLs     Extremity/Trunk Assessment   Upper Extremity Assessment Upper Extremity Assessment: Overall WFL for tasks assessed    Lower Extremity Assessment Lower Extremity Assessment: Defer to PT evaluation    Cervical / Trunk Assessment Cervical / Trunk Assessment: Normal  Communication   Communication Communication: No apparent difficulties    Cognition Arousal: Alert Behavior During Therapy: [facility]  for tasks assessed/performed   PT - Cognitive impairments: No apparent impairments                         Following commands: Intact       Cueing       General Comments General comments (skin integrity, edema, etc.): Pt eager to be discharged and return to ADL/IADLs    Exercises     Assessment/Plan    PT Assessment Patient does not need any further PT services  PT Problem List         PT Treatment Interventions      PT Goals (Current goals can be found in the Care Plan section)  Acute Rehab PT Goals Patient Stated Goal: pt wants to go home PT Goal Formulation:  With patient Time For Goal Achievement: 08/29/24 Potential to Achieve Goals: Good    Frequency       Co-evaluation               AM-PAC PT 6 Clicks Mobility  Outcome Measure Help needed turning from your back to your side while in a flat bed without using bedrails?: None Help needed moving from lying on your back to sitting on the side of a flat bed without using bedrails?: None Help needed moving to and from a bed to a chair (including a wheelchair)?: None Help needed standing up from a chair using your arms (e.g., wheelchair or bedside chair)?: None Help needed to walk in hospital room?: None Help needed climbing 3-5 steps with a railing? : A Little 6 Click Score: 23    End of Session   Activity Tolerance: Patient tolerated treatment well Patient left: in chair Nurse Communication: Mobility status PT Visit Diagnosis: Unsteadiness on feet (R26.81)    Time: 9092-9082 PT Time Calculation (min) (ACUTE ONLY): 10 min   Charges:   PT Evaluation $PT Eval Low Complexity: 1 Low   PT General Charges $$ ACUTE PT VISIT: 1 Visit         Elizabeth Lesches DPT, PT    Elizabeth [name] 08/13/2024, 10:48 AM

## 2024-08-13 NOTE — Plan of Care (Signed)
 Problem: Education: Goal: Knowledge of disease or condition will improve 08/13/2024 0449 by Pola Bridegroom, RN Outcome: Progressing 08/13/2024 0447 by Pola Bridegroom, RN Outcome: Progressing Goal: Knowledge of secondary prevention will improve (MUST DOCUMENT ALL) 08/13/2024 0449 by Pola Bridegroom, RN Outcome: Progressing 08/13/2024 0447 by Pola Bridegroom, RN Outcome: Progressing Goal: Knowledge of patient specific risk factors will improve (DELETE if not current risk factor) 08/13/2024 0449 by Pola Bridegroom, RN Outcome: Progressing 08/13/2024 0447 by Pola Bridegroom, RN Outcome: Progressing   Problem: Ischemic Stroke/TIA Tissue Perfusion: Goal: Complications of ischemic stroke/TIA will be minimized 08/13/2024 0449 by Pola Bridegroom, RN Note: Blurring vision  08/13/2024 0449 by Pola Bridegroom, RN Outcome: Progressing 08/13/2024 0447 by Pola Bridegroom, RN Outcome: Progressing   Problem: Coping: Goal: Will verbalize positive feelings about self 08/13/2024 0449 by Pola Bridegroom, RN Outcome: Progressing 08/13/2024 0447 by Pola Bridegroom, RN Outcome: Progressing Goal: Will identify appropriate support needs 08/13/2024 0449 by Pola Bridegroom, RN Outcome: Progressing 08/13/2024 0447 by Pola Bridegroom, RN Outcome: Progressing   Problem: Health Behavior/Discharge Planning: Goal: Ability to manage health-related needs will improve 08/13/2024 0449 by Pola Bridegroom, RN Outcome: Progressing 08/13/2024 0447 by Pola Bridegroom, RN Outcome: Progressing Goal: Goals will be collaboratively established with patient/family 08/13/2024 0449 by Pola Bridegroom, RN Outcome: Progressing 08/13/2024 0447 by Pola Bridegroom, RN Outcome: Progressing   Problem: Self-Care: Goal: Ability to participate in self-care as condition permits will improve 08/13/2024 0449 by Pola Bridegroom, RN Outcome: Progressing 08/13/2024 0447 by Pola Bridegroom, RN Outcome: Progressing Goal: Verbalization of feelings and concerns over difficulty with self-care will improve 08/13/2024 0449 by Pola Bridegroom,  RN Outcome: Progressing 08/13/2024 0447 by Pola Bridegroom, RN Outcome: Progressing Goal: Ability to communicate needs accurately will improve 08/13/2024 0449 by Pola Bridegroom, RN Outcome: Progressing 08/13/2024 0447 by Pola Bridegroom, RN Outcome: Progressing   Problem: Nutrition: Goal: Risk of aspiration will decrease 08/13/2024 0449 by Pola Bridegroom, RN Outcome: Progressing 08/13/2024 0447 by Pola Bridegroom, RN Outcome: Progressing Goal: Dietary intake will improve 08/13/2024 0449 by Pola Bridegroom, RN Outcome: Progressing 08/13/2024 0447 by Pola Bridegroom, RN Outcome: Progressing   Problem: Education: Goal: Knowledge of General Education information will improve Description: Including pain rating scale, medication(s)/side effects and non-pharmacologic comfort measures 08/13/2024 0449 by Pola Bridegroom, RN Outcome: Progressing 08/13/2024 0447 by Pola Bridegroom, RN Outcome: Progressing   Problem: Health Behavior/Discharge Planning: Goal: Ability to manage health-related needs will improve 08/13/2024 0449 by Pola Bridegroom, RN Outcome: Progressing 08/13/2024 0447 by Pola Bridegroom, RN Outcome: Progressing   Problem: Clinical Measurements: Goal: Ability to maintain clinical measurements within normal limits will improve 08/13/2024 0449 by Pola Bridegroom, RN Outcome: Progressing 08/13/2024 0447 by Pola Bridegroom, RN Outcome: Progressing Goal: Will remain free from infection 08/13/2024 0449 by Pola Bridegroom, RN Outcome: Progressing 08/13/2024 0447 by Pola Bridegroom, RN Outcome: Progressing Goal: Diagnostic test results will improve 08/13/2024 0449 by Pola Bridegroom, RN Outcome: Progressing 08/13/2024 0447 by Pola Bridegroom, RN Outcome: Progressing Goal: Respiratory complications will improve 08/13/2024 0449 by Pola Bridegroom, RN Outcome: Progressing 08/13/2024 0447 by Pola Bridegroom, RN Outcome: Progressing Goal: Cardiovascular complication will be avoided 08/13/2024 0449 by Pola Bridegroom, RN Outcome: Progressing 08/13/2024 0447 by Pola Bridegroom,  RN Outcome: Progressing   Problem: Activity: Goal: Risk for activity intolerance will decrease 08/13/2024 0449 by Pola Bridegroom, RN Outcome: Progressing 08/13/2024 0447 by Pola Bridegroom, RN Outcome: Progressing   Problem: Nutrition: Goal: Adequate nutrition will be maintained 08/13/2024 0449 by Pola Bridegroom, RN Outcome: Progressing 08/13/2024 0447 by Pola Bridegroom, RN Outcome: Progressing  Problem: Coping: Goal: Level of anxiety will decrease 08/13/2024 0449 by Pola Bridegroom, RN Outcome: Progressing 08/13/2024 0447 by Pola Bridegroom, RN Outcome: Progressing   Problem: Elimination: Goal: Will not experience complications related to bowel motility 08/13/2024 0449 by Pola Bridegroom, RN Outcome: Progressing 08/13/2024 0447 by Pola Bridegroom, RN Outcome: Progressing Goal: Will not experience complications related to urinary retention 08/13/2024 0449 by Pola Bridegroom, RN Outcome: Progressing 08/13/2024 0447 by Pola Bridegroom, RN Outcome: Progressing   Problem: Pain Managment: Goal: General experience of comfort will improve and/or be controlled 08/13/2024 0449 by Pola Bridegroom, RN Outcome: Progressing 08/13/2024 0447 by Pola Bridegroom, RN Outcome: Progressing   Problem: Safety: Goal: Ability to remain free from injury will improve 08/13/2024 0449 by Pola Bridegroom, RN Outcome: Progressing 08/13/2024 0447 by Pola Bridegroom, RN Outcome: Progressing   Problem: Skin Integrity: Goal: Risk for impaired skin integrity will decrease 08/13/2024 0449 by Pola Bridegroom, RN Outcome: Progressing 08/13/2024 0447 by Pola Bridegroom, RN Outcome: Progressing

## 2024-08-13 NOTE — Discharge Summary (Signed)
 Physician Discharge Summary   Elizabeth [name]  female DOB: 12-10-1940  FMW:[id number]  PCP: Rudolpho Norleen BIRCH, MD  Admit date: 08/12/2024 Discharge date: 08/13/2024  Admitted From: home Disposition:  home Husband updated at bedside prior to discharge. CODE STATUS: Full code  Discharge Instructions     Diet - low sodium heart healthy   Complete by: As directed    Discharge instructions   Complete by: As directed    Neurologist recommended taking aspirin  81 mg and plavix 75 mg together for 90 days, and then just aspirin  81 daily by itself.  Your bad cholesterol LDL is 208 (goal is <70).  Since you are intolerant of Lipitor and Crestor , please try Zocor 80 mg daily.  Follow up with PCP for other additional methods of lowering LDL. [facility] Course:  For full details, please see H&P, progress notes, consult notes and ancillary notes.  Briefly,  Elizabeth [name] is a 83 y.o. female with medical history significant of hyperlipidemia, autoimmune diabetes, CKD 3A, hypothyroidism, status post C-spine fusion presenting with visual changes found to have stroke on outpatient MRI.   MRI brain showed 4 mm probable late subacute infarct within the posterior right frontal lobe white matter.  MRI orbits neg acute findings.  CVA > Patient having left-sided visual changes for the past 1.5 weeks.  Saw ophthalmology who ordered MRI.  Positive for CVA.  No other deficits.  The location of the stroke does not account for pt's visual symptoms. - Neurology consulted. --LDL is 208 (goal is <70).  Pt wasn't on statin PTA and has known intolerance of Lipitor and Crestor .  After discussion with pt and neuro, pt is discharged on Zocor 80 mg daily, and if intolerant of Zocor or unable to reach goal <70 with Zocor, then need to seek alternative LDL-lowering agent with outpatient provider. --pt is discharged on aspirin  81 mg and plavix 75 mg together for 90 days, and then just aspirin  81 daily  by itself.    Hyperlipidemia --see above.   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 - Continue home insulin  pump   CKD 3A - Creatinine stable   Hypothyroidism - Continue Synthroid     Unless noted above, medications under STOP list are ones pt was not taking PTA.  Discharge Diagnoses:  Principal Problem:   CVA (cerebral vascular accident) (HCC) Active Problems:   LADA (latent autoimmune diabetes in adults), managed as type 1 (HCC)   Insulin  pump in place   Hyperlipidemia, unspecified   Hypothyroidism   CKD stage 3a, GFR 45-59 ml/min [facility])     Discharge Instructions:  Allergies as of 08/13/2024       Reactions   Neosporin [neomycin -bacitracin Zn-polymyx] Other (See Comments)   Skin rash or redness   Amoxicillin-pot Clavulanate Other (See Comments)   Nausea   Bacitracin Other (See Comments)   Ceclor [cefaclor] Other (See Comments)   Unsure of reaction type   Other    Other reaction(s): Other (See Comments) sulfasaline-Does not remember   Polymyxin B Other (See Comments)   Polysorbate Other (See Comments)   Relafen [nabumetone] Other (See Comments)   Unsure of reaction type   Sulfasalazine Other (See Comments)   Unsure of reaction type   Amoxicillin Nausea And Vomiting   Has patient had a PCN reaction causing immediate rash, facial/tongue/throat swelling, SOB or lightheadedness with hypotension: No Has patient had a PCN reaction causing severe  rash involving mucus membranes or skin necrosis:No Has patient had a PCN reaction that required hospitalization: No Has patient had a PCN reaction occurring within the last 10 years: No If all of the above answers are NO, then may proceed with Cephalosporin use.   Band-aid Plus Antibiotic [bacitracin-polymyxin B] Other (See Comments)   ADHESIVE, NOT ANTIBIOTIC Can use paper tape PULLS SKIN OFF Other reaction(s): Other (See Comments) PULLS SKIN OFF PULLS SKIN OFF   Clindamycin   Rash   Niacin    Other reaction(s): Other (See Comments) flushing   Niaspan [niacin Er (antihyperlipidemic)] Other (See Comments)   FLUSHING   Penicillin V Potassium Other (See Comments)   Has patient had a PCN reaction causing immediate rash, facial/tongue/throat swelling, SOB or lightheadedness with hypotension: Unknown Has patient had a PCN reaction causing severe rash involving mucus membranes or skin necrosis: Unknown Has patient had a PCN reaction that required hospitalization: No Has patient had a PCN reaction occurring within the last 10 years: No If all of the above answers are NO, then may proceed with Cephalosporin use.   Penicillins Nausea And Vomiting   Happened so long ago and patient is not sure of reaction   Statins Other (See Comments)   Other reaction(s): Other (See Comments) arthralgia ARTHRALGIA arthralgia        Medication List     STOP taking these medications    CALCIUM  600+D3 PO   lidocaine  2 % solution Commonly known as: XYLOCAINE    rosuvastatin  10 MG tablet Commonly known as: CRESTOR        TAKE these medications    aspirin  EC 81 MG tablet Take 1 tablet (81 mg total) by mouth daily. Swallow whole.   Biotin  10000 MCG Tabs Take 10,000 mcg by mouth daily at 12 noon.   CALCIUM  CITRATE + D PO Take 1,200 mg by mouth daily.   clopidogrel 75 MG tablet Commonly known as: Plavix Take 1 tablet (75 mg total) by mouth daily.   cyanocobalamin  1000 MCG tablet Commonly known as: VITAMIN B12 Take 1,000 mcg by mouth daily at 12 noon.   furosemide 20 MG tablet Commonly known as: LASIX Take 20 mg by mouth every other day. On Monday, Wednesday, and Friday   glucagon  1 MG injection Inject 1 mg into the muscle daily as needed (for low blood sugars). Reported on 02/21/2016   glucose 5 g chewable tablet Chew 15 g by mouth 3 (three) times daily as needed for low blood sugar.   insulin  aspart 100 UNIT/ML injection Commonly known as: novoLOG  Inject  0-60 Units into the skin daily. Up to 60 units via insulin  pump   insulin  pump Soln Inject into the skin. NOVOLOG  100 UNIT/ML injection   levothyroxine  88 MCG tablet Commonly known as: SYNTHROID  Take 88 mcg by mouth every morning.   Melatonin 10 MG Tabs Take 10 mg by mouth at bedtime.   simvastatin 80 MG tablet Commonly known as: Zocor Take 1 tablet (80 mg total) by mouth every evening.         Follow-up Information     Rudolpho Norleen BIRCH, MD Follow up in 1 week(s).   Specialty: Internal Medicine Contact information: [address] [facility] [place] [address] [phone]                 Allergies  Allergen Reactions   Neosporin [Neomycin -Bacitracin Zn-Polymyx] Other (See Comments)    Skin rash or redness   Amoxicillin-Pot Clavulanate Other (See Comments)  Nausea   Bacitracin Other (See Comments)   Ceclor [Cefaclor] Other (See Comments)    Unsure of reaction type   Other     Other reaction(s): Other (See Comments) sulfasaline-Does not remember   Polymyxin B Other (See Comments)   Polysorbate Other (See Comments)   Relafen [Nabumetone] Other (See Comments)    Unsure of reaction type   Sulfasalazine Other (See Comments)    Unsure of reaction type   Amoxicillin Nausea And Vomiting    Has patient had a PCN reaction causing immediate rash, facial/tongue/throat swelling, SOB or lightheadedness with hypotension: No Has patient had a PCN reaction causing severe rash involving mucus membranes or skin necrosis:No Has patient had a PCN reaction that required hospitalization: No Has patient had a PCN reaction occurring within the last 10 years: No If all of the above answers are NO, then may proceed with Cephalosporin use.    Band-Aid Plus Antibiotic [Bacitracin-Polymyxin B] Other (See Comments)    ADHESIVE, NOT ANTIBIOTIC Can use paper tape PULLS SKIN OFF Other reaction(s): Other (See Comments) PULLS SKIN OFF PULLS SKIN OFF   Clindamycin   Rash   Niacin     Other reaction(s): Other (See Comments) flushing   Niaspan [Niacin Er (Antihyperlipidemic)] Other (See Comments)    FLUSHING   Penicillin V Potassium Other (See Comments)    Has patient had a PCN reaction causing immediate rash, facial/tongue/throat swelling, SOB or lightheadedness with hypotension: Unknown Has patient had a PCN reaction causing severe rash involving mucus membranes or skin necrosis: Unknown Has patient had a PCN reaction that required hospitalization: No Has patient had a PCN reaction occurring within the last 10 years: No If all of the above answers are NO, then may proceed with Cephalosporin use.    Penicillins Nausea And Vomiting    Happened so long ago and patient is not sure of reaction   Statins Other (See Comments)    Other reaction(s): Other (See Comments) arthralgia ARTHRALGIA arthralgia     The results of significant diagnostics from this hospitalization (including imaging, microbiology, ancillary and laboratory) are listed below for reference.   Consultations:   Procedures/Studies: ECHOCARDIOGRAM COMPLETE Result Date: 08/13/2024    ECHOCARDIOGRAM REPORT   Patient Name:   Elizabeth [name] [facility] Date of Exam: 08/13/2024 Medical Rec #:  [id number]         Height:       63.0 in Accession #:    [id number]        Weight:       155.0 lb Date of Birth:  05-07-1941        BSA:          1.735 m Patient Age:    82 years          BP:           133/68 mmHg Patient Gender: F                 HR:           56 bpm. Exam Location:  [facility] Procedure: 2D Echo, Cardiac Doppler, Color Doppler and Intracardiac            Opacification Agent (Both Spectral and Color Flow Doppler were            utilized during procedure). Indications:     Stroke I63.9  History:         Patient has no prior history of Echocardiogram examinations.  Sonographer:     Thedora Louder  RDCS, FASE Referring Phys:  [id number] MARSA NOVAK MELVIN Diagnosing Phys: Redell Cave MD  Sonographer  Comments: Technically difficult study due to poor echo windows and suboptimal apical window. IMPRESSIONS  1. Left ventricular ejection fraction, by estimation, is 60 to 65%. Left ventricular ejection fraction by PLAX is 63 %. The left ventricle has normal function. The left ventricle has no regional wall motion abnormalities. Left ventricular diastolic parameters were normal.  2. Right ventricular systolic function is normal. The right ventricular size is normal.  3. Left atrial size was mildly dilated.  4. The mitral valve is normal in structure. Mild mitral valve regurgitation.  5. The aortic valve is calcified. Aortic valve regurgitation is not visualized. FINDINGS  Left Ventricle: Left ventricular ejection fraction, by estimation, is 60 to 65%. Left ventricular ejection fraction by PLAX is 63 %. The left ventricle has normal function. The left ventricle has no regional wall motion abnormalities. Definity contrast agent was given IV to delineate the left ventricular endocardial borders. The left ventricular internal cavity size was normal in size. There is no left ventricular hypertrophy. Left ventricular diastolic parameters were normal. Right Ventricle: The right ventricular size is normal. No increase in right ventricular wall thickness. Right ventricular systolic function is normal. Left Atrium: Left atrial size was mildly dilated. Right Atrium: Right atrial size was normal in size. Pericardium: There is no evidence of pericardial effusion. Mitral Valve: The mitral valve is normal in structure. Mild mitral valve regurgitation. Tricuspid Valve: The tricuspid valve is normal in structure. Tricuspid valve regurgitation is not demonstrated. Aortic Valve: The aortic valve is calcified. Aortic valve regurgitation is not visualized. Aortic valve mean gradient measures 7.0 mmHg. Aortic valve peak gradient measures 14.1 mmHg. Aortic valve area, by VTI measures 1.53 cm. Pulmonic Valve: The pulmonic valve was normal in  structure. Pulmonic valve regurgitation is not visualized. Aorta: The aortic root and ascending aorta are structurally normal, with no evidence of dilitation. Venous: The inferior vena cava was not well visualized. IAS/Shunts: No atrial level shunt detected by color flow Doppler.  LEFT VENTRICLE PLAX 2D LV EF:         Left            Diastology                ventricular     LV e' medial:    7.94 cm/s                ejection        LV E/e' medial:  13.4                fraction by     LV e' lateral:   8.70 cm/s                PLAX is 63      LV E/e' lateral: 12.2                %. LVIDd:         3.85 cm LVIDs:         2.55 cm LV PW:         0.95 cm LV IVS:        0.90 cm LVOT diam:     1.80 cm LV SV:         63 LV SV Index:   36 LVOT Area:     2.54 cm  LV Volumes (MOD) LV vol d, MOD    54.0 ml A2C:  LV vol s, MOD    9.3 ml A2C: LV SV MOD A2C:   44.7 ml RIGHT VENTRICLE RV Basal diam:  2.50 cm LEFT ATRIUM             Index LA diam:        2.80 cm 1.61 cm/m LA Vol (A2C):   60.2 ml 34.70 ml/m LA Vol (A4C):   58.5 ml 33.72 ml/m LA Biplane Vol: 59.3 ml 34.18 ml/m  AORTIC VALVE                     PULMONIC VALVE AV Area (Vmax):    1.42 cm      RVOT Peak grad: 3 mmHg AV Area (Vmean):   1.33 cm AV Area (VTI):     1.53 cm AV Vmax:           188.00 cm/s AV Vmean:          120.000 cm/s AV VTI:            0.410 m AV Peak Grad:      14.1 mmHg AV Mean Grad:      7.0 mmHg LVOT Vmax:         105.00 cm/s LVOT Vmean:        62.800 cm/s LVOT VTI:          0.247 m LVOT/AV VTI ratio: 0.60  AORTA Ao Root diam: 2.70 cm Ao Asc diam:  2.30 cm MITRAL VALVE MV Area (PHT): 2.17 cm     SHUNTS MV Decel Time: 349 msec     Systemic VTI:  0.25 m MV E velocity: 106.00 cm/s  Systemic Diam: 1.80 cm MV A velocity: 96.90 cm/s MV E/A ratio:  1.09 Redell Cave MD Electronically signed by Redell Cave MD Signature Date/Time: 08/13/2024/2:57:24 PM    Final    CT ANGIO HEAD NECK W WO CM Result Date: 08/12/2024 CLINICAL DATA:  Provided  history: Stroke/TIA, determine embolic source. Additional history provided: vision changes. EXAM: CT ANGIOGRAPHY HEAD AND NECK WITH AND WITHOUT CONTRAST TECHNIQUE: Multidetector CT imaging of the head and neck was performed using the standard protocol during bolus administration of intravenous contrast. Multiplanar CT image reconstructions and MIPs were obtained to evaluate the vascular anatomy. Carotid stenosis measurements (when applicable) are obtained utilizing NASCET criteria, using the distal internal carotid diameter as the denominator. RADIATION DOSE REDUCTION: This exam was performed according to the departmental dose-optimization program which includes automated exposure control, adjustment of the mA and/or kV according to patient size and/or use of iterative reconstruction technique. CONTRAST:  75mL OMNIPAQUE  IOHEXOL  350 MG/ML SOLN COMPARISON:  MRI brain/orbits 08/11/2024. FINDINGS: CT HEAD FINDINGS Brain: Mild generalized cerebral atrophy. The 4 mm probable late subacute infarct demonstrated within the posterior right frontal lobe white matter on yesterday's brain MRI is occult by CT. Moderate patchy and ill-defined hypoattenuation elsewhere within the cerebral white matter, nonspecific but compatible with chronic small vessel ischemic disease. There is no acute intracranial hemorrhage. No extra-axial fluid collection. No evidence of an intracranial mass. No midline shift. Vascular: No hyperdense vessel.  Atherosclerotic calcifications. Skull: No calvarial fracture or aggressive osseous lesion. Sinuses/Orbits: No acute orbital finding. Postsurgical appearance of the paranasal sinuses. Mild mucosal thickening within the right maxillary sinus. Review of the MIP images confirms the above findings CTA NECK FINDINGS Aortic arch: Common origin of the innominate and left common carotid arteries. Sclerotic plaque within the aortic arch and proximal major branch vessels of the neck. Streak/beam hardening  artifact  arising from a dense contrast bolus partially obscures the left subclavian artery. Within this limitation, there is no appreciable hemodynamically significant innominate or proximal subclavian artery stenosis. Right carotid system: CCA and ICA patent within the neck. Atherosclerotic plaque at the carotid bifurcation and within the proximal ICA without measurable stenosis Left carotid system: Streak/beam hardening artifact arising from a dense contrast bolus partially obscures the proximal common carotid artery. The common carotid and internal carotid arteries are patent within the neck. Atherosclerotic plaque about the carotid bifurcation and within proximal ICA. Less than 50% stenosis of the distal CCA. No stenosis of the proximal ICA. Vertebral arteries: Codominant and patent within the neck without stenosis or significant atherosclerotic disease. Skeleton: Prior C5-C7 ACDF. Spondylosis at the cervical and visible upper thoracic levels. No acute fracture or aggressive osseous lesion. Other neck: Nonspecific mildly enlarged right level IB and II lymph nodes (measuring up to 12 mm in short axis). Upper chest: No consolidation within the imaged lung apices. Review of the MIP images confirms the above findings CTA HEAD FINDINGS Anterior circulation: The intracranial internal carotid arteries are patent. Non-stenotic calcified plaque within both vessels. The M1 middle cerebral arteries are patent. No M2 proximal branch occlusion or high-grade proximal stenosis. The anterior cerebral arteries are patent. No intracranial aneurysm is identified. Posterior circulation: The intracranial vertebral arteries are patent. The basilar artery is patent. The posterior cerebral arteries are patent. Moderate stenosis within the right posterior cerebral artery P2 segment. Severe stenosis within a right PCA branch at the P2/P3 junction. Posterior communicating arteries are diminutive or absent, bilaterally. Venous sinuses: Within the  limitations of contrast timing, no convincing thrombus. Anatomic variants: As described. Review of the MIP images confirms the above findings IMPRESSION: Non-contrast head CT: 1. The 4 mm probable late subacute infarct demonstrated within the right frontal lobe white matter on yesterday's brain MRI is occult by CT. No CT evidence of an interval acute intracranial abnormality. 2. Background parenchymal atrophy and chronic small vessel ischemic disease. 3. Mild right maxillary sinus mucosal thickening. CTA neck: 1. Artifact partially obscures the proximal left common carotid artery. Within this limitation, the common carotid and internal carotid arteries are patent within the neck without hemodynamically significant stenosis. Atherosclerotic plaque bilaterally, as described. 2. The vertebral arteries are patent within the neck without stenosis or significant atherosclerotic disease. 3. Aortic Atherosclerosis (ICD10-I70.0). 4. Nonspecific mildly enlarged right level IB and II lymph nodes CTA head: 1. No proximal intracranial large vessel occlusion. 2. Intracranial atherosclerotic disease as described. Most notably, there is a moderate stenosis within the right posterior cerebral artery P2 segment, and a severe stenosis within a right posterior cerebral artery branch at the P2/P3 junction. Electronically Signed   By: Rockey Childs D.O.   On: 08/12/2024 16:57   MR ORBITS W WO CONTRAST Result Date: 08/11/2024 CLINICAL DATA:  Provided history: Left homonymous hemianopsia. Additional history provided by the scanning technologist: Left eye vision changes and discomfort. EXAM: MRI HEAD AND ORBITS WITHOUT AND WITH CONTRAST TECHNIQUE: Multiplanar, multiecho pulse sequences of the brain and surrounding structures were obtained without and with intravenous contrast. Multiplanar, multiecho pulse sequences of the orbits and surrounding structures were obtained including fat saturation techniques, before and after intravenous  contrast administration. CONTRAST:  7mL GADAVIST  GADOBUTROL  1 MMOL/ML IV SOLN COMPARISON:  Brain MRI 03/25/2013. FINDINGS: MRI HEAD FINDINGS Brain: Mild generalized cerebral atrophy. 4 mm focus of diffusion-weighted and T2 FLAIR hyperintense signal abnormality within the posterior right frontal lobe white matter.  There is corresponding T2 shine through at this site on the [facility] map, and this likely reflects a small late subacute infarct. Multifocal T2 FLAIR hyperintense signal abnormality elsewhere within the cerebral white matter and pons, nonspecific but compatible with moderate chronic small vessel ischemic disease. Prominent perivascular space within the left lentiform nucleus. Chronic microhemorrhage at the left insula. No cortical encephalomalacia is identified. There is no acute infarct. No evidence of an intracranial mass. No extra-axial fluid collection. No midline shift. No pathologic intracranial enhancement identified. Vascular: Maintained flow voids within the proximal large arterial vessels. Skull and upper cervical spine: No focal worrisome marrow lesion. Incompletely assessed cervical spondylosis. Susceptibility artifact arising from ACDF hardware. MRI ORBITS FINDINGS The coronal T2 sequence is mildly motion degraded. Within this limitation, findings are as follows. Orbits: Prior bilateral ocular lens replacement. The globes are normal in size and contour. The extraocular muscles, optic nerve sheath complexes and lacrimal glands are symmetric and unremarkable. No orbital mass or pathologic orbital enhancement. Visualized sinuses: Postsurgical appearance of the paranasal sinuses. Mild mucosal thickening within the right maxillary sinus. Soft tissues: Unremarkable appearance of the periorbital and visible facial soft tissues. MRI brain impression #1 will be called to the ordering clinician or representative by the Radiologist Assistant, and communication documented in the PACS or [employer].  IMPRESSION: MRI brain: 1. 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 2. Background chronic small vessel ischemic changes within the cerebral white matter and pons, progressed from the prior MRI of 03/25/2013 and overall moderate in severity. 3. Mild generalized cerebral atrophy. MRI orbits: 1. Mildly motion degraded examination. 2. Prior bilateral ocular lens replacement. 3. Otherwise unremarkable MRI appearance of the orbits. 4. Mild right maxillary sinus mucosal thickening. Electronically Signed   By: Rockey Childs D.O.   On: 08/11/2024 20:16   MR BRAIN W WO CONTRAST Result Date: 08/11/2024 CLINICAL DATA:  Provided history: Left homonymous hemianopsia. Additional history provided by the scanning technologist: Left eye vision changes and discomfort. EXAM: MRI HEAD AND ORBITS WITHOUT AND WITH CONTRAST TECHNIQUE: Multiplanar, multiecho pulse sequences of the brain and surrounding structures were obtained without and with intravenous contrast. Multiplanar, multiecho pulse sequences of the orbits and surrounding structures were obtained including fat saturation techniques, before and after intravenous contrast administration. CONTRAST:  7mL GADAVIST  GADOBUTROL  1 MMOL/ML IV SOLN COMPARISON:  Brain MRI 03/25/2013. FINDINGS: MRI HEAD FINDINGS Brain: Mild generalized cerebral atrophy. 4 mm focus of diffusion-weighted and T2 FLAIR hyperintense signal abnormality within the posterior right frontal lobe white matter. There is corresponding T2 shine through at this site on the [facility] map, and this likely reflects a small late subacute infarct. Multifocal T2 FLAIR hyperintense signal abnormality elsewhere within the cerebral white matter and pons, nonspecific but compatible with moderate chronic small vessel ischemic disease. Prominent perivascular space within the left lentiform nucleus. Chronic microhemorrhage at the left insula. No cortical encephalomalacia is identified. There  is no acute infarct. No evidence of an intracranial mass. No extra-axial fluid collection. No midline shift. No pathologic intracranial enhancement identified. Vascular: Maintained flow voids within the proximal large arterial vessels. Skull and upper cervical spine: No focal worrisome marrow lesion. Incompletely assessed cervical spondylosis. Susceptibility artifact arising from ACDF hardware. MRI ORBITS FINDINGS The coronal T2 sequence is mildly motion degraded. Within this limitation, findings are as follows. Orbits: Prior bilateral ocular lens replacement. The globes are normal in size and contour. The extraocular muscles, optic nerve sheath complexes and lacrimal glands  are symmetric and unremarkable. No orbital mass or pathologic orbital enhancement. Visualized sinuses: Postsurgical appearance of the paranasal sinuses. Mild mucosal thickening within the right maxillary sinus. Soft tissues: Unremarkable appearance of the periorbital and visible facial soft tissues. MRI brain impression #1 will be called to the ordering clinician or representative by the Radiologist Assistant, and communication documented in the PACS or [employer]. IMPRESSION: MRI brain: 1. 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 2. Background chronic small vessel ischemic changes within the cerebral white matter and pons, progressed from the prior MRI of 03/25/2013 and overall moderate in severity. 3. Mild generalized cerebral atrophy. MRI orbits: 1. Mildly motion degraded examination. 2. Prior bilateral ocular lens replacement. 3. Otherwise unremarkable MRI appearance of the orbits. 4. Mild right maxillary sinus mucosal thickening. Electronically Signed   By: Rockey Childs D.O.   On: 08/11/2024 20:16      Labs: BNP (last 3 results) No results for input(s): BNP in the last 8760 hours. Basic Metabolic Panel: Recent Labs  Lab 08/12/24 1228 08/13/24 0700  NA 134* 136  K 5.1 4.5   CL 96* 101  CO2 24 26  GLUCOSE 148* 116*  BUN 16 13  CREATININE 0.86 0.86  CALCIUM  9.5 9.1   Liver Function Tests: Recent Labs  Lab 08/12/24 1228 08/13/24 0700  AST 24 25  ALT 12 11  ALKPHOS 73 76  BILITOT 1.7* 1.6*  PROT 7.3 7.2  ALBUMIN 4.2 3.6   No results for input(s): LIPASE, AMYLASE in the last 168 hours. No results for input(s): AMMONIA in the last 168 hours. CBC: Recent Labs  Lab 08/12/24 1228 08/13/24 0700  WBC 8.5 6.9  NEUTROABS 6.6 4.8  HGB 13.6 13.7  HCT 40.2 40.5  MCV 88.7 87.9  PLT 243 222   Cardiac Enzymes: No results for input(s): CKTOTAL, CKMB, CKMBINDEX, TROPONINI in the last 168 hours. BNP: Invalid input(s): POCBNP CBG: Recent Labs  Lab 08/12/24 2143 08/12/24 2353 08/13/24 0355 08/13/24 0806 08/13/24 1155  GLUCAP 64* 111* 115* 106* 97   D-Dimer No results for input(s): DDIMER in the last 72 hours. Hgb A1c Recent Labs    08/13/24 0700  HGBA1C 6.0*   Lipid Profile Recent Labs    08/13/24 0700  CHOL 299*  HDL 78  LDLCALC 208*  TRIG 66  CHOLHDL 3.8   Thyroid  function studies No results for input(s): TSH, T4TOTAL, T3FREE, THYROIDAB in the last 72 hours.  Invalid input(s): FREET3 Anemia work up No results for input(s): VITAMINB12, FOLATE, FERRITIN, TIBC, IRON, RETICCTPCT in the last 72 hours. Urinalysis    Component Value Date/Time   COLORURINE STRAW (A) 03/22/2023 2320   APPEARANCEUR CLEAR (A) 03/22/2023 2320   APPEARANCEUR Clear 03/24/2013 2249   LABSPEC 1.016 03/22/2023 2320   LABSPEC 1.008 03/24/2013 2249   PHURINE 5.0 03/22/2023 2320   GLUCOSEU >=500 (A) 03/22/2023 2320   GLUCOSEU Negative 03/24/2013 2249   HGBUR NEGATIVE 03/22/2023 2320   BILIRUBINUR NEGATIVE 03/22/2023 2320   BILIRUBINUR Negative 03/24/2013 2249   KETONESUR 5 (A) 03/22/2023 2320   PROTEINUR NEGATIVE 03/22/2023 2320   NITRITE NEGATIVE 03/22/2023 2320   LEUKOCYTESUR NEGATIVE 03/22/2023 2320    LEUKOCYTESUR Trace 03/24/2013 2249   Sepsis Labs Recent Labs  Lab 08/12/24 1228 08/13/24 0700  WBC 8.5 6.9   Microbiology No results found for this or any previous visit (from the past 240 hours).   Total time spend on discharging this patient, including the last  patient exam, discussing the hospital stay, instructions for ongoing care as it relates to all pertinent caregivers, as well as preparing the medical discharge records, prescriptions, and/or referrals as applicable, is 45 minutes.    Ellouise Haber, MD  [facility] 08/13/2024, 3:42 PM

## 2024-08-13 NOTE — Consult Note (Incomplete)
 NEUROLOGY CONSULT NOTE   Date of service: August 13, 2024 Patient Name: Elizabeth [name] MRN:  [id number] DOB:  08-18-1941 Chief Complaint: *** Requesting Provider: Awanda City, MD  History of Present Illness  Elizabeth [name] is a 83 y.o. female with hx of ***  LKW: *** Modified rankin score: {Modified Rankin Scale:21264} IV Thrombolysis: ***Yes, *** No (reason) EVT: ***Yes, *** No (reason) ICH Score:***  NIHSS components Score: Comment  1a Level of Conscious 0[]  1[]  2[]  3[]      1b LOC Questions 0[]  1[]  2[]       1c LOC Commands 0[]  1[]  2[]       2 Best Gaze 0[]  1[]  2[]       3 Visual 0[]  1[]  2[]  3[]      4 Facial Palsy 0[]  1[]  2[]  3[]      5a Motor Arm - left 0[]  1[]  2[]  3[]  4[]  UN[]    5b Motor Arm - Right 0[]  1[]  2[]  3[]  4[]  UN[]    6a Motor Leg - Left 0[]  1[]  2[]  3[]  4[]  UN[]    6b Motor Leg - Right 0[]  1[]  2[]  3[]  4[]  UN[]    7 Limb Ataxia 0[]  1[]  2[]  UN[]      8 Sensory 0[]  1[]  2[]  UN[]      9 Best Language 0[]  1[]  2[]  3[]      10 Dysarthria 0[]  1[]  2[]  UN[]      11 Extinct. 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 VERY BRITTLE. per patient, it can drop 140 points in an hour   GERD (gastroesophageal reflux disease)    H/O   HCAP (healthcare-associated pneumonia) 08/27/2022   Heart murmur    not followed by anyone   Hx of cervical spine surgery    Hyperlipidemia    Hypothyroidism    Insulin  pump in place    PONV (postoperative nausea and vomiting)    NO PROBLEMS SINCE USING ZOFRAN  INTRAOP    Severe sepsis (HCC) 08/27/2022      Past Surgical History:  Procedure Laterality Date   ABDOMINAL HYSTERECTOMY  1972   BLADDER SURGERY  2003   leaky bladder   BREAST BIOPSY Left 2015   neg   CATARACT EXTRACTION W/PHACO Right 02/21/2016   Procedure: CATARACT EXTRACTION PHACO AND INTRAOCULAR LENS PLACEMENT (IOC);  Surgeon: Dene Etienne, MD;  Location: [facility];  Service: Ophthalmology;  Laterality: Right;  Lot# [id number] HUS: 00:50.6AP%:17.0CDE: 8.56   CATARACT EXTRACTION W/PHACO Left 03/26/2016   Procedure: CATARACT EXTRACTION PHACO AND INTRAOCULAR LENS PLACEMENT (IOC) left eye;  Surgeon: Dene Etienne, MD;  Location: [facility];  Service: Ophthalmology;  Laterality: Left;  DIABETIC - insulin  pump SYMFONY TORIC LENS   CHOLECYSTECTOMY  1995   COLONOSCOPY WITH PROPOFOL  N/A 01/28/2016   Procedure: COLONOSCOPY WITH PROPOFOL ;  Surgeon: Deward CINDERELLA Piedmont, MD;  Location: [facility];  Service: Gastroenterology;  Laterality: N/A;   KNEE ARTHROPLASTY Left 02/03/2018   Procedure: COMPUTER ASSISTED TOTAL KNEE ARTHROPLASTY;  Surgeon: Mardee Lynwood SQUIBB, MD;  Location: [facility];  Service: Orthopedics;  Laterality: Left;   KNEE ARTHROSCOPY Left 09/17/2015   Procedure: ARTHROSCOPY KNEE,partial medial menisectomy, chondroplasty medial patella femoral.;  Surgeon: Lynwood SQUIBB Mardee, MD;  Location: [facility];  Service: Orthopedics;  Laterality: Left;   mallet toe Bilateral 2013, 2018   2nd toes   MENISECTOMY Right    2011   NECK SURGERY  2010   2 discs.  has plate and screws in place   SEPTOPLASTY  1997    Family History: Family History  Problem Relation Age of Onset   Breast cancer Maternal Aunt 15   Breast cancer Paternal Aunt 52    Social History  reports that she has never smoked. She has never used smokeless tobacco. She reports that she does not drink alcohol  and does not use drugs.  Allergies  Allergen Reactions   Neosporin [Neomycin -Bacitracin Zn-Polymyx] Other (See Comments)     Skin rash or redness   Amoxicillin-Pot Clavulanate Other (See Comments)    Nausea   Bacitracin Other (See Comments)   Ceclor [Cefaclor] Other (See Comments)    Unsure of reaction type   Other     Other reaction(s): Other (See Comments) sulfasaline-Does not remember   Polymyxin B Other (See Comments)   Polysorbate Other (See Comments)   Relafen [Nabumetone] Other (See Comments)    Unsure of reaction type   Sulfasalazine Other (See Comments)    Unsure of reaction type   Amoxicillin Nausea And Vomiting    Has patient had a PCN reaction causing immediate rash, facial/tongue/throat swelling, SOB or lightheadedness with hypotension: No Has patient had a PCN reaction causing severe rash involving mucus membranes or skin necrosis:No Has patient had a PCN reaction that required hospitalization: No Has patient had a PCN reaction occurring within the last 10 years: No If all of the above answers are NO, then may proceed with Cephalosporin use.    Band-Aid Plus Antibiotic [Bacitracin-Polymyxin B] Other (See Comments)    ADHESIVE, NOT ANTIBIOTIC Can use paper tape PULLS SKIN OFF Other reaction(s): Other (See Comments) PULLS SKIN OFF PULLS SKIN OFF   Clindamycin  Rash   Niacin     Other reaction(s): Other (See Comments) flushing   Niaspan [Niacin Er (Antihyperlipidemic)] Other (See Comments)    FLUSHING   Penicillin V Potassium Other (See Comments)    Has patient had a PCN reaction causing immediate rash, facial/tongue/throat swelling, SOB or lightheadedness with hypotension: Unknown Has patient had a PCN reaction causing severe rash involving mucus membranes or skin necrosis: Unknown Has patient had a PCN reaction that required hospitalization: No Has patient had a PCN reaction occurring within the last 10 years: No If all of the above answers are NO, then may proceed with Cephalosporin use.    Penicillins Nausea And Vomiting    Happened so long ago and patient is not sure of  reaction   Statins Other (See Comments)    Other reaction(s): Other (See Comments) arthralgia ARTHRALGIA arthralgia    Medications   Current Facility-Administered Medications:    acetaminophen  (TYLENOL ) tablet 650 mg, 650 mg, Oral, Q4H PRN **OR** acetaminophen  (TYLENOL ) 160 MG/5ML solution 650 mg, 650 mg, Per Tube, Q4H PRN, Melvin, Alexander B, MD   aspirin  tablet 325 mg, 325 mg, Oral, Daily, Melvin, Alexander B, MD, 325 mg at 08/13/24 0820   atorvastatin (LIPITOR) tablet 80 mg, 80 mg, Oral, Daily, Awanda City, MD, 80 mg at 08/13/24 1224   enoxaparin  (LOVENOX ) injection  40 mg, 40 mg, Subcutaneous, Q24H, Melvin, Alexander B, MD, 40 mg at 08/14/2024 1548   insulin  pump, , Subcutaneous, TID WC, HS, 0200, Melvin, Alexander B, MD, Given at 08/13/24 1225   levothyroxine  (SYNTHROID ) tablet 88 mcg, 88 mcg, Oral, Daily, Melvin, Alexander B, MD, 88 mcg at 08/13/24 9487   senna-docusate (Senokot-S) tablet 1 tablet, 1 tablet, Oral, QHS PRN, Seena Marsa NOVAK, MD  Vitals   Vitals:   2024/08/14 2354 08/13/24 0354 08/13/24 0735 08/13/24 1152  BP: (!) 146/74 (!) 156/78 (!) 160/77 133/68  Pulse: 74 67 62 60  Resp: 18 18 18 18   Temp: 97.9 F (36.6 C) 97.6 F (36.4 C) 98 F (36.7 C) 98.1 F (36.7 C)  TempSrc:      SpO2: 98% 99% 92% 94%  Weight:      Height:        Body mass index is 27.45 kg/m.   Physical Exam   Constitutional: Appears well-developed and well-nourished. *** Psych: Affect appropriate to situation. *** Eyes: No scleral injection. *** HENT: No OP obstruction. *** Head: Normocephalic. *** Cardiovascular: Normal rate and regular rhythm. *** Respiratory: Effort normal, non-labored breathing. *** GI: Soft.  No distension. There is no tenderness. *** Skin: WDI. ***  Neurologic Examination   ***  Labs/Imaging/Neurodiagnostic studies   CBC:  Recent Labs  Lab Aug 14, 2024 1228 08/13/24 0700  WBC 8.5 6.9  NEUTROABS 6.6 4.8  HGB 13.6 13.7  HCT 40.2 40.5  MCV 88.7 87.9   PLT 243 222   Basic Metabolic Panel:  Lab Results  Component Value Date   NA 136 08/13/2024   K 4.5 08/13/2024   CO2 26 08/13/2024   GLUCOSE 116 (H) 08/13/2024   BUN 13 08/13/2024   CREATININE 0.86 08/13/2024   CALCIUM  9.1 08/13/2024   GFRNONAA >60 08/13/2024   GFRAA >60 02/05/2018   Lipid Panel:  Lab Results  Component Value Date   LDLCALC 208 (H) 08/13/2024   HgbA1c:  Lab Results  Component Value Date   HGBA1C 6.0 (H) 08/13/2024   Urine Drug Screen: No results found for: LABOPIA, COCAINSCRNUR, LABBENZ, AMPHETMU, THCU, LABBARB  Alcohol  Level     Component Value Date/Time   ETH <15 Aug 14, 2024 1228   INR  Lab Results  Component Value Date   INR 1.0 08-14-24   APTT  Lab Results  Component Value Date   APTT 27 08-14-2024   AED levels: No results found for: PHENYTOIN, ZONISAMIDE, LAMOTRIGINE, LEVETIRACETA  MRI brain:   1. 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 2. Background chronic small vessel ischemic changes within the cerebral white matter and pons, progressed from the prior MRI of 03/25/2013 and overall moderate in severity. 3. Mild generalized cerebral atrophy.   MRI orbits:   1. Mildly motion degraded examination. 2. Prior bilateral ocular lens replacement. 3. Otherwise unremarkable MRI appearance of the orbits. 4. Mild right maxillary sinus mucosal thickening.  Non-contrast head CT:   1. The 4 mm probable late subacute infarct demonstrated within the right frontal lobe white matter on yesterday's brain MRI is occult by CT. No CT evidence of an interval acute intracranial abnormality. 2. Background parenchymal atrophy and chronic small vessel ischemic disease. 3. Mild right maxillary sinus mucosal thickening.   CTA neck:   1. Artifact partially obscures the proximal left common carotid artery. Within this limitation, the common carotid and internal carotid arteries are  patent within the neck without hemodynamically significant stenosis. Atherosclerotic plaque  bilaterally, as described. 2. The vertebral arteries are patent within the neck without stenosis or significant atherosclerotic disease. 3. Aortic Atherosclerosis (ICD10-I70.0). 4. Nonspecific mildly enlarged right level IB and II lymph nodes   CTA head:   1. No proximal intracranial large vessel occlusion. 2. Intracranial atherosclerotic disease as described. Most notably, there is a moderate stenosis within the right posterior cerebral artery P2 segment, and a severe stenosis within a right posterior cerebral artery branch at the P2/P3 junction.  Stroke Labs     Component Value Date/Time   CHOL 299 (H) 08/13/2024 0700   TRIG 66 08/13/2024 0700   HDL 78 08/13/2024 0700   CHOLHDL 3.8 08/13/2024 0700   VLDL 13 08/13/2024 0700   LDLCALC 208 (H) 08/13/2024 0700    Lab Results  Component Value Date/Time   HGBA1C 6.0 (H) 08/13/2024 07:00 AM     ASSESSMENT   83 yo with hx diabetes, HL admitted after outpatient MRI brain showed 4mm incidental acute infarct. MRI brain was ordered after patient presented to ophtho reporting visual changes in the lateral hemifield of her L eye, described as geometric shapes then colors. Her eye symptoms have nearly completely resolves and she has no visual field deficits on exam. She denies headache, temporal tenderness, or jaw claudication. I would like her to follow up in neurology clinic at [facility] to make sure that her visual symptoms resolve and that she does not develop headaches or other sx concerning for GCA. While the types of visual hallucinations she describes can be seen in Carlin Abrahams syndrome she does not have any visual field deficits I can appreciate on my exam which is reassuring. Her stroke workup is now complete.  RECOMMENDATIONS   - No indication for permissive HTN for subacute stroke - ASA 81mg  daily + plavix 75mg  daily x90 days f/b ASA  81mg  daily monotherapy after that - Patient intolerant of rosuvastatin  and atorvastatin in the past, amenable to trying simvastatin. Would also recommend that she discuss with PCP other additional methods of lowering LDL since her LDL is 208 (goal post-stroke is <70) - Tele - PT/OT/SLP - Stroke education - Amb referral to neurology upon discharge   ______________________________________________________________________    Signed, Elida CHRISTELLA Ross, MD [facility]

## 2024-08-13 NOTE — Progress Notes (Signed)
 SLP Cancellation Note  Patient Details Name: Elizabeth [name] MRN: [id number] DOB: 12/20/1940   Cancelled treatment:       Reason Eval/Treat Not Completed: SLP screened, no needs identified, will sign off  Orders received for speech language evaluation. Pt alert in bed, oriented, and reporting grossly approaching baseline function. Only complaint remains vision in left eye. No reported/observed deficits with speech, language, and cognition. No acute SLP services indicated at this time.   [place] Jumana Paccione Clapp, MS, CCC-SLP Speech Language Pathologist Rehab Services; [facility] [phone] (ascom)      [place] J Clapp 08/13/2024, 9:26 AM

## 2024-08-13 NOTE — Plan of Care (Signed)

## 2024-08-13 NOTE — Progress Notes (Signed)
  Echocardiogram 2D Echocardiogram has been performed. Definity IV ultrasound imaging agent used on this study.  Thedora GORMAN Louder 08/13/2024, 1:46 PM

## 2024-08-13 NOTE — Evaluation (Signed)
 Occupational Therapy Evaluation Patient Details Name: Elizabeth [name] MRN: [id number] DOB: 08-05-1941 Today's Date: 08/13/2024   History of Present Illness   Elizabeth [name] is a 83 y.o. female with medical history significant of hyperlipidemia, autoimmune diabetes, CKD 3A, hypothyroidism, status post C-spine fusion presenting with visual changes at time of admission on 09/26.     Clinical Impressions Pt seen for OT evaluation this date. Prior to hospital admission, pt was independent in all aspects of ADL/IADL, no DME use at baseline, and denies falls history in past 3 months. Pt lives with her spouse in a 1 story home with 3 steps to enter. On arrival to pt room she was completed UB/LB dressing in standing with no UE support, amb within room with pushing her IV pole around, and good safety awareness noted throughout. Currently pt reporting symptoms have resolved. Pt demonstrates baseline independence to perform ADL and mobility tasks and no strength, sensory, coordination, or cognitive deficits appreciated with assessment. Pt reports some blurriness in her L peripheral vision however with no disturbance in completion of her ADLs safely during session or otherwise. No skilled OT needs identified. Will sign off. Please re-consult if additional OT needs arise.       Functional Status Assessment   Patient has not had a recent decline in their functional status     Equipment Recommendations   None recommended by OT              Precautions/Restrictions   Precautions Precautions: None Recall of Precautions/Restrictions: Intact Restrictions Weight Bearing Restrictions Per Provider Order: No     Mobility Bed Mobility Overal bed mobility: Independent             General bed mobility comments: STS from lowest bed height with no UE support    Transfers Overall transfer level: Independent Equipment used: None               General transfer comment: Amb  within room with no DME use, good safety awareness throughout      Balance Overall balance assessment: Independent                                         ADL either performed or assessed with clinical judgement   ADL Overall ADL's : Independent                                       General ADL Comments: On arrival to room pt dressing herself independently.     Vision Baseline Vision/History: 1 Wears glasses Patient Visual Report: Peripheral vision impairment;Other (comment) (Reported a little blurry spot in her peripheral vision on her L side.) Vision Assessment?: Yes Eye Alignment: Within Functional Limits Ocular Range of Motion: Within Functional Limits Alignment/Gaze Preference: Within Defined Limits Tracking/Visual Pursuits: Able to track stimulus in all quads without difficulty Saccades: Within functional limits Convergence: Within functional limits Visual Fields: No apparent deficits     Perception Perception: Within Functional Limits       Praxis Praxis: WFL       Pertinent Vitals/Pain Pain Assessment Pain Assessment: No/denies pain     Extremity/Trunk Assessment Upper Extremity Assessment Upper Extremity Assessment: Overall WFL for tasks assessed   Lower Extremity Assessment Lower Extremity Assessment: Defer to PT evaluation   Cervical /  Trunk Assessment Cervical / Trunk Assessment: Normal   Communication Communication Communication: No apparent difficulties   Cognition Arousal: Alert Behavior During Therapy: WFL for tasks assessed/performed Cognition: No apparent impairments             OT - Cognition Comments: A/Ox4                 Following commands: Intact       Cueing  General Comments   Cueing Techniques: Verbal cues  Pt eager to be discharged and return to ADL/IADLs   Exercises Exercises: Other exercises Other Exercises Other Exercises: Edu: Role of OT eval, safety awareness of visual  changes during IADL completion   Shoulder Instructions      Home Living Family/patient expects to be discharged to:: Private residence Living Arrangements: Spouse/significant other;Other relatives Available Help at Discharge: Family;Available 24 hours/day Type of Home: House Home Access: Stairs to enter [employer] of Steps: 3 Entrance Stairs-Rails: Left;Right Home Layout: One level;Laundry or work area in basement     [employer] Shower/Tub: Producer, television/film/video: Handicapped height Bathroom Accessibility: Yes   Home Equipment: Agricultural consultant (2 wheels);Cane - single point          Prior Functioning/Environment Prior Level of Function : Independent/Modified Independent             Mobility Comments: was outside prunning srubs yesterday; just finished up 3 months of PT ADLs Comments: I with ADLs/IADLs                 OT Goals(Current goals can be found in the care plan section)   Acute Rehab OT Goals Patient Stated Goal: Go home soon OT Goal Formulation: With patient Time For Goal Achievement: 08/27/24 Potential to Achieve Goals: Good   AM-PAC OT 6 Clicks Daily Activity     Outcome Measure Help from another person eating meals?: None Help from another person taking care of personal grooming?: None Help from another person toileting, which includes using toliet, bedpan, or urinal?: None Help from another person bathing (including washing, rinsing, drying)?: None Help from another person to put on and taking off regular upper body clothing?: None Help from another person to put on and taking off regular lower body clothing?: None 6 Click Score: 24   End of Session Nurse Communication: Mobility status  Activity Tolerance: Patient tolerated treatment well Patient left: in bed;with call bell/phone within reach;with family/visitor present                   Time: 1008-1020 OT Time Calculation (min): 12 min Charges:  OT General  Charges $OT Visit: 1 Visit OT Evaluation $OT Eval Low Complexity: 1 Low  Larraine Colas M.S. OTR/L  08/13/24, 10:32 AM

## 2024-08-17 DIAGNOSIS — Z961 Presence of intraocular lens: Secondary | ICD-10-CM | POA: Diagnosis not present

## 2024-08-17 DIAGNOSIS — H53462 Homonymous bilateral field defects, left side: Secondary | ICD-10-CM | POA: Diagnosis not present

## 2024-08-17 DIAGNOSIS — H539 Unspecified visual disturbance: Secondary | ICD-10-CM | POA: Diagnosis not present

## 2024-08-17 DIAGNOSIS — H43813 Vitreous degeneration, bilateral: Secondary | ICD-10-CM | POA: Diagnosis not present

## 2024-08-18 DIAGNOSIS — I639 Cerebral infarction, unspecified: Secondary | ICD-10-CM | POA: Diagnosis not present

## 2024-08-18 DIAGNOSIS — Z09 Encounter for follow-up examination after completed treatment for conditions other than malignant neoplasm: Secondary | ICD-10-CM | POA: Diagnosis not present

## 2024-08-18 NOTE — Progress Notes (Signed)
 Chief Complaint:   Chief Complaint  Patient presents with  . Follow-up    Subjective:   Elizabeth [name] is a 83 y.o. female in today for hospital follow-up.  She was hospitalized for CVA.  It was discovered when she went to her eye doctor and he sent her for an orbital MRI.  Current Outpatient Medications  Medication Sig Dispense Refill  . artificial tears (GONIOSCOP-HPM) 2.5 % ophthalmic solution Apply to eye    . aspirin  81 MG EC tablet Take 81 mg by mouth once daily    . blood-glucose sensor (DEXCOM G7 SENSOR) Devi Use 1 each every 10 (ten) days 9 each 3  . CALCIUM  CITRATE-VITAMIN D2 ORAL Take 1,200 mg by mouth once daily    . clopidogreL (PLAVIX) 75 mg tablet Take 75 mg by mouth once daily    . GLUCAGON  1 mg injection Inject 1 mg into the muscle once as needed    . insulin  ASPART (NOVOLOG  U-100 INSULIN  ASPART) injection (concentration 100 units/mL) ADMINISTER UP TO 60 UNITS VIA INSULIN  PUMP DAILY 60 mL 4  . levothyroxine  (SYNTHROID ) 88 MCG tablet Take 1 tablet (88 mcg total) by mouth once daily Take on an empty stomach with a glass of water at least 30-60 minutes before breakfast. 90 tablet 4  . ONETOUCH ULTRA TEST test strip USE 3 TIMES DAILY WHEN OFF SENSOR 100 each 3  . pen needle, diabetic 31 gauge x 3/16 needle Use as directed (use 1 needle subcutaneously daily)    . simvastatin (ZOCOR) 80 MG tablet Take 80 mg by mouth every evening    . VITAMIN B-12 1,000 mcg SL tablet Take 1 tablet (1,000 mcg total) by mouth once daily    . ZINC ORAL Take 10 mg by mouth    . FUROsemide (LASIX) 20 MG tablet Take 1 tablet (20 mg total) by mouth every Monday, Wednesday, and Friday for 39 days 39 tablet 0   No current facility-administered medications for this visit.    Allergies as of 08/18/2024 - Reviewed 08/18/2024  Allergen Reaction Noted  . Augmentin [amoxicillin-pot clavulanate] Other (See Comments) 01/06/2023  . Bacitracin Other (See Comments) 05/06/2024  . Neomycin  sulfate Other  (See Comments) 05/06/2024  . Polymyxin b Other (See Comments) 05/06/2024  . Bacitracin zinc-polymyxin b Other (See Comments) 09/10/2015  . Ceclor [cefaclor] Other (See Comments) 02/20/2014  . Clindamycin  Rash 02/20/2014  . Neomycin -bacitracnzn-polymyxnb Rash 09/04/2015  . Niaspan extended-release [niacin] Other (See Comments) 02/20/2014  . Relafen [nabumetone] Other (See Comments) 04/12/2014  . Statins-hmg-coa reductase inhibitors Muscle Pain 02/20/2014  . Sulfasalazine Other (See Comments) 09/04/2015    Past Medical History:  Diagnosis Date  . Allergic state   . Arthritis   . Bronchitis   . Cataract cortical, senile 2017  . Diverticulosis 01/28/2016  . DM (diabetes mellitus), type 1 (CMS/HHS-HCC) 2011  . Hyperlipidemia   . Hypothyroidism   . 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 Mild intermittent asthma (HHS-HCC)    late onset  . Sinusitis, unspecified   . TMJ (temporomandibular joint disorder)   . Urinary incontinence     Past Surgical History:  Procedure Laterality Date  . LAMINOPLASTY POSTERIOR CERVICAL SPINE  3/10   by Dr. Mavis  . Partial medial and lateral meniscectomy Right 03/07/10  . Left knee arthroscopy, partial medial meniscectomy, and chondroplasty  09/17/2015  . COLONOSCOPY  01/28/2016   Diverticulosis/Otherwise normal colon/No Repeat/PYO  . Left total knee arthropasty using  computer-assisted navigation  02/03/2018   Dr Mardee  . bladder surgery    . CHOLECYSTECTOMY    . HYSTERECTOMY    . KNEE ARTHROSCOPY  2011  - 2016  . SEPTOPLASTY       Family History  Problem Relation Name Age of Onset  . Cirrhosis Mother Wells Lesches   . Liver cancer Mother Wells Lesches   . Osteoporosis (Thinning of bones) Mother Wells Lesches   . Thyroid  disease Mother Wells Lesches   . Colon cancer Father Marcey Lesches   . Dementia Father Marcey Lesches   . Colon polyps Father Marcey Lesches   . Prostate cancer Father Marcey Lesches   .  Skin cancer Father Marcey Lesches   . Alzheimer's disease Father Marcey Lesches   . Hyperlipidemia (Elevated cholesterol) Father Marcey Lesches   . High blood pressure (Hypertension) Father Marcey Lesches   . Breast cancer Maternal Aunt    . Diabetes Maternal Aunt    . Diabetes Maternal Grandmother Wells Gobble   . Heart disease Maternal Grandmother Wells Gobble   . Diabetes type II Maternal Grandmother Ada Webster   . Breast cancer Paternal Aunt    . Colon cancer Paternal Aunt    . Breast cancer Maternal Aunt Clotilde Gobble   . Diabetes type II Maternal Aunt Clotilde Gobble   . Breast cancer Paternal Aunt Mervyn Sar   . Colon cancer Paternal Aunt Mervyn Sar   . Hip fracture Paternal Aunt Mervyn Sar   . Osteoporosis (Thinning of bones) Paternal Aunt Mervyn Sar   . Alzheimer's disease Paternal Aunt Mervyn Sar   . Colon polyps Daughter Summerlynn Glauser   . Thyroid  disease Daughter Horace Wishon     Social History:  reports that she has never smoked. She has never used smokeless tobacco. She reports that she does not drink alcohol  and does not use drugs.  Results for orders placed or performed in visit on 06/30/24  Basic Metabolic Panel (BMP)  Result Value Ref Range   Glucose 142 (H) 70 - 110 mg/dL   Sodium 866 (L) 863 - 145 mmol/L   Potassium 5.4 (H) 3.6 - 5.1 mmol/L   Chloride 97 97 - 109 mmol/L   Carbon Dioxide (CO2) 31.1 22.0 - 32.0 mmol/L   Calcium  9.5 8.7 - 10.3 mg/dL   Urea Nitrogen (BUN) 16 7 - 25 mg/dL   Creatinine 0.9 0.6 - 1.1 mg/dL   Glomerular Filtration Rate (eGFR) 63 >60 mL/min/1.73sq m   BUN/Crea Ratio 17.8 6.0 - 20.0   Anion Gap w/K 10.3 6.0 - 16.0   *Note: Due to a large number of results and/or encounters for the requested time period, some results have not been displayed. A complete set of results can be found in Results Review.      ROS:  General: No fever, chills or recent illness. No change in weight Skin:   No skin lesions, growths, masses,  rashes, pruritus  HEENT: No change in vision or hearing. No pain or difficulty with swallowing Respiratory: No cough or shortness of breath CV:  No chest pain or palpitations GI:  No pain, dyspepsia or change in bowel habits GU:  No dysuria, frequency, or hesitancy MSK:  No joint pain or injury Neurological: No headaches, changes in mental status, loss of sensation or strength Endocrine:  No heat or cold intolerance, polydipsia, polyuria  Objective:   Body mass index is 28.09 kg/m.  BP 120/84   Pulse 72   Ht 160 cm (5'  3)   Wt 71.9 kg (158 lb 9.6 oz)   SpO2 95%   BMI 28.09 kg/m   General: WD/WN female, in no acute distress Respiratory:clear to auscultation.  No dullness to percussion.  No use of accessory muscles. Cardiac:  Regular rate and rhythm without murmur, gallops, or rubs. Neuro: CN grossly intact.  No acute decrease in sensation in the upper and lower extremities bilaterally.    Assessment/Plan:   Hospital discharge follow-up  (primary encounter diagnosis) Cerebrovascular accident (CVA), unspecified mechanism (CMS/HHS-HCC)  Assessment and Plan  1.  Hospital follow-up.  Reviewed hospital documentation, lab results and imaging studies.  Reviewed discharge medications and reconciled them. 2.  CVA.  She is on appropriate therapy for secondary prevention.    Goals     . * Maintain health/healthy lifestyle (pt-stated)      Continue doing what I am doing.     . * Maintain health/healthy lifestyle (pt-stated)    . * Maintain health/healthy lifestyle (pt-stated)    . * Maintain health/healthy lifestyle (pt-stated)    . * Maintain health/healthy lifestyle (pt-stated)    . * Maintain health/healthy lifestyle (pt-stated)       Goals       Patient Stated   . * Maintain health/healthy lifestyle (pt-stated)      Continue doing what I am doing.     . * Maintain health/healthy lifestyle (pt-stated)    . * Maintain health/healthy lifestyle (pt-stated)    . *  Maintain health/healthy lifestyle (pt-stated)    . * Maintain health/healthy lifestyle (pt-stated)               NORLEEN ALM ROWER, MD  Portions of this note were created using dictation software and may contain typographical errors.  *Some images could not be shown.

## 2024-09-02 ENCOUNTER — Other Ambulatory Visit: Payer: Self-pay | Admitting: Internal Medicine

## 2024-09-02 DIAGNOSIS — Z1231 Encounter for screening mammogram for malignant neoplasm of breast: Secondary | ICD-10-CM

## 2024-10-06 DIAGNOSIS — E109 Type 1 diabetes mellitus without complications: Secondary | ICD-10-CM | POA: Diagnosis not present

## 2024-10-06 DIAGNOSIS — E785 Hyperlipidemia, unspecified: Secondary | ICD-10-CM | POA: Diagnosis not present

## 2024-10-06 DIAGNOSIS — E1069 Type 1 diabetes mellitus with other specified complication: Secondary | ICD-10-CM | POA: Diagnosis not present

## 2024-10-06 DIAGNOSIS — E039 Hypothyroidism, unspecified: Secondary | ICD-10-CM | POA: Diagnosis not present

## 2024-10-20 DIAGNOSIS — H539 Unspecified visual disturbance: Secondary | ICD-10-CM | POA: Diagnosis not present

## 2024-11-28 ENCOUNTER — Ambulatory Visit
Admission: RE | Admit: 2024-11-28 | Discharge: 2024-11-28 | Disposition: A | Discharge status: Home / Self Care | Source: Ambulatory Visit | Attending: Internal Medicine | Admitting: Internal Medicine

## 2024-11-28 DIAGNOSIS — Z1231 Encounter for screening mammogram for malignant neoplasm of breast: Secondary | ICD-10-CM | POA: Diagnosis present
